# Patient Record
Sex: Male | Born: 1935 | Race: White | Hispanic: No | Marital: Married | Smoking: Former smoker
Health system: Southern US, Community
[De-identification: ages and names within clinical notes are randomized; demographics above are authoritative.]

## PROBLEM LIST (undated history)

## (undated) DIAGNOSIS — I10 Essential (primary) hypertension: Secondary | ICD-10-CM

## (undated) DIAGNOSIS — F200 Paranoid schizophrenia: Secondary | ICD-10-CM

## (undated) DIAGNOSIS — I5189 Other ill-defined heart diseases: Secondary | ICD-10-CM

## (undated) DIAGNOSIS — R1314 Dysphagia, pharyngoesophageal phase: Secondary | ICD-10-CM

## (undated) DIAGNOSIS — E669 Obesity, unspecified: Secondary | ICD-10-CM

## (undated) DIAGNOSIS — E785 Hyperlipidemia, unspecified: Secondary | ICD-10-CM

## (undated) DIAGNOSIS — R0600 Dyspnea, unspecified: Secondary | ICD-10-CM

## (undated) DIAGNOSIS — I2510 Atherosclerotic heart disease of native coronary artery without angina pectoris: Secondary | ICD-10-CM

## (undated) DIAGNOSIS — R269 Unspecified abnormalities of gait and mobility: Secondary | ICD-10-CM

## (undated) DIAGNOSIS — R251 Tremor, unspecified: Secondary | ICD-10-CM

## (undated) DIAGNOSIS — G2581 Restless legs syndrome: Secondary | ICD-10-CM

## (undated) DIAGNOSIS — G20 Parkinson's disease: Principal | ICD-10-CM

## (undated) DIAGNOSIS — J189 Pneumonia, unspecified organism: Secondary | ICD-10-CM

## (undated) DIAGNOSIS — S42309A Unspecified fracture of shaft of humerus, unspecified arm, initial encounter for closed fracture: Secondary | ICD-10-CM

## (undated) DIAGNOSIS — Z8679 Personal history of other diseases of the circulatory system: Secondary | ICD-10-CM

## (undated) DIAGNOSIS — I679 Cerebrovascular disease, unspecified: Secondary | ICD-10-CM

## (undated) HISTORY — DX: Personal history of other diseases of the circulatory system: Z86.79

## (undated) HISTORY — DX: Restless legs syndrome: G25.81

## (undated) HISTORY — PX: TONSILLECTOMY: SUR1361

## (undated) HISTORY — DX: Other ill-defined heart diseases: I51.89

## (undated) HISTORY — DX: Unspecified fracture of shaft of humerus, unspecified arm, initial encounter for closed fracture: S42.309A

## (undated) HISTORY — DX: Parkinson's disease: G20

## (undated) HISTORY — DX: Cerebrovascular disease, unspecified: I67.9

## (undated) HISTORY — DX: Paranoid schizophrenia: F20.0

## (undated) HISTORY — DX: Dyspnea, unspecified: R06.00

## (undated) HISTORY — PX: CHOLECYSTECTOMY: SHX55

## (undated) HISTORY — DX: Obesity, unspecified: E66.9

## (undated) HISTORY — DX: Unspecified abnormalities of gait and mobility: R26.9

## (undated) HISTORY — DX: Tremor, unspecified: R25.1

## (undated) HISTORY — DX: Hyperlipidemia, unspecified: E78.5

## (undated) HISTORY — DX: Dysphagia, pharyngoesophageal phase: R13.14

## (undated) HISTORY — DX: Atherosclerotic heart disease of native coronary artery without angina pectoris: I25.10

## (undated) HISTORY — DX: Pneumonia, unspecified organism: J18.9

## (undated) HISTORY — DX: Essential (primary) hypertension: I10

## (undated) HISTORY — PX: ASD REPAIR: SHX258

---

## 1998-08-14 ENCOUNTER — Ambulatory Visit (HOSPITAL_COMMUNITY): Admission: RE | Admit: 1998-08-14 | Discharge: 1998-08-14 | Payer: Self-pay | Admitting: Internal Medicine

## 1998-12-19 ENCOUNTER — Encounter: Payer: Self-pay | Admitting: Emergency Medicine

## 1998-12-19 ENCOUNTER — Emergency Department (HOSPITAL_COMMUNITY): Admission: EM | Admit: 1998-12-19 | Discharge: 1998-12-19 | Payer: Self-pay | Admitting: Emergency Medicine

## 2006-03-31 HISTORY — PX: CORONARY ARTERY BYPASS GRAFT: SHX141

## 2006-08-30 ENCOUNTER — Encounter: Payer: Self-pay | Admitting: Emergency Medicine

## 2006-08-30 ENCOUNTER — Inpatient Hospital Stay (HOSPITAL_COMMUNITY): Admission: AD | Admit: 2006-08-30 | Discharge: 2006-09-09 | Payer: Self-pay | Admitting: Cardiology

## 2006-08-31 ENCOUNTER — Encounter: Payer: Self-pay | Admitting: Cardiology

## 2006-08-31 ENCOUNTER — Encounter: Payer: Self-pay | Admitting: Surgery

## 2006-08-31 HISTORY — PX: CARDIAC CATHETERIZATION: SHX172

## 2006-09-01 ENCOUNTER — Ambulatory Visit: Payer: Self-pay | Admitting: Surgery

## 2006-09-01 ENCOUNTER — Encounter: Payer: Self-pay | Admitting: Surgery

## 2006-10-06 ENCOUNTER — Ambulatory Visit: Payer: Self-pay | Admitting: Surgery

## 2007-04-28 ENCOUNTER — Ambulatory Visit: Payer: Self-pay | Admitting: Vascular Surgery

## 2008-05-25 ENCOUNTER — Encounter: Admission: RE | Admit: 2008-05-25 | Discharge: 2008-05-25 | Payer: Self-pay | Admitting: Neurology

## 2009-03-27 ENCOUNTER — Inpatient Hospital Stay (HOSPITAL_COMMUNITY): Admission: EM | Admit: 2009-03-27 | Discharge: 2009-04-02 | Payer: Self-pay | Admitting: Emergency Medicine

## 2009-05-04 ENCOUNTER — Inpatient Hospital Stay (HOSPITAL_COMMUNITY): Admission: AD | Admit: 2009-05-04 | Discharge: 2009-05-09 | Payer: Self-pay | Admitting: Internal Medicine

## 2009-05-07 ENCOUNTER — Encounter (INDEPENDENT_AMBULATORY_CARE_PROVIDER_SITE_OTHER): Payer: Self-pay | Admitting: Internal Medicine

## 2009-08-06 ENCOUNTER — Emergency Department (HOSPITAL_COMMUNITY): Admission: EM | Admit: 2009-08-06 | Discharge: 2009-08-06 | Payer: Self-pay | Admitting: Emergency Medicine

## 2010-03-24 ENCOUNTER — Emergency Department (HOSPITAL_COMMUNITY)
Admission: EM | Admit: 2010-03-24 | Discharge: 2010-03-24 | Payer: Self-pay | Source: Home / Self Care | Admitting: Emergency Medicine

## 2010-03-28 ENCOUNTER — Ambulatory Visit: Payer: Self-pay | Admitting: Cardiology

## 2010-04-02 ENCOUNTER — Emergency Department (HOSPITAL_COMMUNITY)
Admission: EM | Admit: 2010-04-02 | Discharge: 2010-04-03 | Payer: Self-pay | Source: Home / Self Care | Admitting: Emergency Medicine

## 2010-04-03 LAB — POCT I-STAT, CHEM 8
BUN: 10 mg/dL (ref 6–23)
Calcium, Ion: 1.04 mmol/L — ABNORMAL LOW (ref 1.12–1.32)
Chloride: 94 mEq/L — ABNORMAL LOW (ref 96–112)
Creatinine, Ser: 0.7 mg/dL (ref 0.4–1.5)
Glucose, Bld: 206 mg/dL — ABNORMAL HIGH (ref 70–99)
HCT: 45 % (ref 39.0–52.0)
Hemoglobin: 15.3 g/dL (ref 13.0–17.0)
Potassium: 3.2 mEq/L — ABNORMAL LOW (ref 3.5–5.1)
Sodium: 131 mEq/L — ABNORMAL LOW (ref 135–145)
TCO2: 28 mmol/L (ref 0–100)

## 2010-04-03 LAB — POCT CARDIAC MARKERS
CKMB, poc: 1.1 ng/mL (ref 1.0–8.0)
Myoglobin, poc: 61.8 ng/mL (ref 12–200)
Troponin i, poc: <0.05 ng/mL (ref 0.00–0.09)

## 2010-05-10 ENCOUNTER — Encounter (INDEPENDENT_AMBULATORY_CARE_PROVIDER_SITE_OTHER): Payer: Medicare Other | Admitting: Cardiology

## 2010-05-10 DIAGNOSIS — I519 Heart disease, unspecified: Secondary | ICD-10-CM

## 2010-05-10 DIAGNOSIS — I10 Essential (primary) hypertension: Secondary | ICD-10-CM

## 2010-05-10 DIAGNOSIS — E119 Type 2 diabetes mellitus without complications: Secondary | ICD-10-CM

## 2010-05-10 DIAGNOSIS — R0602 Shortness of breath: Secondary | ICD-10-CM

## 2010-06-10 LAB — URINALYSIS, ROUTINE W REFLEX MICROSCOPIC
Bilirubin Urine: NEGATIVE
Glucose, UA: 500 mg/dL — AB
Hgb urine dipstick: NEGATIVE
Ketones, ur: NEGATIVE mg/dL
Nitrite: NEGATIVE
Protein, ur: NEGATIVE mg/dL
Specific Gravity, Urine: 1.011 (ref 1.005–1.030)
Urobilinogen, UA: 1 mg/dL (ref 0.0–1.0)
pH: 7 (ref 5.0–8.0)

## 2010-06-10 LAB — DIFFERENTIAL
Basophils Absolute: 0 10*3/uL (ref 0.0–0.1)
Basophils Absolute: 0 10*3/uL (ref 0.0–0.1)
Basophils Relative: 0 % (ref 0–1)
Eosinophils Absolute: 0.1 10*3/uL (ref 0.0–0.7)
Eosinophils Relative: 1 % (ref 0–5)
Lymphocytes Relative: 28 % (ref 12–46)
Lymphocytes Relative: 31 % (ref 12–46)
Lymphs Abs: 3.3 10*3/uL (ref 0.7–4.0)
Monocytes Absolute: 1 10*3/uL (ref 0.1–1.0)
Monocytes Absolute: 1.2 10*3/uL — ABNORMAL HIGH (ref 0.1–1.0)
Monocytes Relative: 10 % (ref 3–12)
Monocytes Relative: 11 % (ref 3–12)
Neutro Abs: 6.3 10*3/uL (ref 1.7–7.7)
Neutro Abs: 6.5 10*3/uL (ref 1.7–7.7)
Neutrophils Relative %: 58 % (ref 43–77)
Neutrophils Relative %: 59 % (ref 43–77)

## 2010-06-10 LAB — CBC
HCT: 38.5 % — ABNORMAL LOW (ref 39.0–52.0)
HCT: 40.8 % (ref 39.0–52.0)
Hemoglobin: 13.3 g/dL (ref 13.0–17.0)
Hemoglobin: 14.3 g/dL (ref 13.0–17.0)
MCH: 30.7 pg (ref 26.0–34.0)
MCH: 31.7 pg (ref 26.0–34.0)
MCHC: 34.5 g/dL (ref 30.0–36.0)
MCHC: 35 g/dL (ref 30.0–36.0)
MCV: 88.9 fL (ref 78.0–100.0)
MCV: 90.5 fL (ref 78.0–100.0)
Platelets: 315 10*3/uL (ref 150–400)
Platelets: 355 10*3/uL (ref 150–400)
RBC: 4.33 MIL/uL (ref 4.22–5.81)
RBC: 4.51 MIL/uL (ref 4.22–5.81)
RDW: 12.5 % (ref 11.5–15.5)
RDW: 12.7 % (ref 11.5–15.5)
WBC: 10.8 10*3/uL — ABNORMAL HIGH (ref 4.0–10.5)
WBC: 10.9 10*3/uL — ABNORMAL HIGH (ref 4.0–10.5)

## 2010-06-10 LAB — POCT CARDIAC MARKERS
CKMB, poc: <1 ng/mL — ABNORMAL LOW (ref 1.0–8.0)
Myoglobin, poc: 55 ng/mL (ref 12–200)
Troponin i, poc: <0.05 ng/mL (ref 0.00–0.09)

## 2010-06-10 LAB — COMPREHENSIVE METABOLIC PANEL
Albumin: 3.6 g/dL (ref 3.5–5.2)
BUN: 10 mg/dL (ref 6–23)
Creatinine, Ser: 0.84 mg/dL (ref 0.4–1.5)
Glucose, Bld: 199 mg/dL — ABNORMAL HIGH (ref 70–99)
Total Protein: 6.9 g/dL (ref 6.0–8.3)

## 2010-06-10 LAB — BRAIN NATRIURETIC PEPTIDE: Pro B Natriuretic peptide (BNP): 160 pg/mL — ABNORMAL HIGH (ref 0.0–100.0)

## 2010-06-16 LAB — COMPREHENSIVE METABOLIC PANEL
ALT: 35 U/L (ref 0–53)
AST: 31 U/L (ref 0–37)
Albumin: 2.7 g/dL — ABNORMAL LOW (ref 3.5–5.2)
Alkaline Phosphatase: 62 U/L (ref 39–117)
Calcium: 8.6 mg/dL (ref 8.4–10.5)
GFR calc Af Amer: >60 mL/min (ref >60)
Glucose, Bld: 126 mg/dL — ABNORMAL HIGH (ref 70–99)
Potassium: 3.6 mEq/L (ref 3.5–5.1)
Sodium: 137 mEq/L (ref 135–145)
Total Protein: 6.3 g/dL (ref 6.0–8.3)

## 2010-06-16 LAB — GLUCOSE, CAPILLARY
Glucose-Capillary: 155 mg/dL — ABNORMAL HIGH (ref 70–99)
Glucose-Capillary: 189 mg/dL — ABNORMAL HIGH (ref 70–99)
Glucose-Capillary: 197 mg/dL — ABNORMAL HIGH (ref 70–99)
Glucose-Capillary: 247 mg/dL — ABNORMAL HIGH (ref 70–99)
Glucose-Capillary: 95 mg/dL (ref 70–99)

## 2010-06-16 LAB — DIFFERENTIAL
Basophils Relative: 1 % (ref 0–1)
Eosinophils Absolute: 0.4 10*3/uL (ref 0.0–0.7)
Eosinophils Relative: 4 % (ref 0–5)
Lymphs Abs: 2.2 10*3/uL (ref 0.7–4.0)
Monocytes Absolute: 1.1 10*3/uL — ABNORMAL HIGH (ref 0.1–1.0)
Monocytes Relative: 11 % (ref 3–12)

## 2010-06-16 LAB — CBC
Hemoglobin: 12 g/dL — ABNORMAL LOW (ref 13.0–17.0)
MCHC: 33.6 g/dL (ref 30.0–36.0)
Platelets: 303 10*3/uL (ref 150–400)
RDW: 13.6 % (ref 11.5–15.5)

## 2010-06-18 LAB — COMPREHENSIVE METABOLIC PANEL
ALT: 21 U/L (ref 0–53)
AST: 24 U/L (ref 0–37)
Alkaline Phosphatase: 69 U/L (ref 39–117)
CO2: 25 mEq/L (ref 19–32)
Chloride: 97 mEq/L (ref 96–112)
GFR calc Af Amer: >60 mL/min (ref >60)
GFR calc non Af Amer: >60 mL/min (ref >60)
Glucose, Bld: 288 mg/dL — ABNORMAL HIGH (ref 70–99)
Sodium: 134 mEq/L — ABNORMAL LOW (ref 135–145)
Total Bilirubin: 0.6 mg/dL (ref 0.3–1.2)

## 2010-06-18 LAB — DIFFERENTIAL
Basophils Absolute: 0 10*3/uL (ref 0.0–0.1)
Basophils Relative: 0 % (ref 0–1)
Eosinophils Absolute: 0.2 10*3/uL (ref 0.0–0.7)
Eosinophils Relative: 2 % (ref 0–5)
Neutrophils Relative %: 65 % (ref 43–77)

## 2010-06-18 LAB — URINALYSIS, ROUTINE W REFLEX MICROSCOPIC
Bilirubin Urine: NEGATIVE
Glucose, UA: 500 mg/dL — AB
Hgb urine dipstick: NEGATIVE
Protein, ur: NEGATIVE mg/dL
Urobilinogen, UA: 0.2 mg/dL (ref 0.0–1.0)

## 2010-06-18 LAB — CBC
HCT: 38.5 % — ABNORMAL LOW (ref 39.0–52.0)
MCHC: 33.5 g/dL (ref 30.0–36.0)
MCV: 93.9 fL (ref 78.0–100.0)
Platelets: 308 10*3/uL (ref 150–400)
RBC: 4.1 MIL/uL — ABNORMAL LOW (ref 4.22–5.81)
WBC: 10.3 10*3/uL (ref 4.0–10.5)

## 2010-06-18 LAB — POCT CARDIAC MARKERS
CKMB, poc: <1 ng/mL — ABNORMAL LOW (ref 1.0–8.0)
Troponin i, poc: <0.05 ng/mL (ref 0.00–0.09)
Troponin i, poc: <0.05 ng/mL (ref 0.00–0.09)

## 2010-06-18 LAB — TROPONIN I: Troponin I: 0.01 ng/mL (ref 0.00–0.06)

## 2010-06-19 LAB — BASIC METABOLIC PANEL
CO2: 27 mEq/L (ref 19–32)
Calcium: 7.6 mg/dL — ABNORMAL LOW (ref 8.4–10.5)
Calcium: 8 mg/dL — ABNORMAL LOW (ref 8.4–10.5)
Chloride: 98 mEq/L (ref 96–112)
GFR calc Af Amer: >60 mL/min (ref >60)
GFR calc non Af Amer: >60 mL/min (ref >60)
Potassium: 3.3 mEq/L — ABNORMAL LOW (ref 3.5–5.1)
Potassium: 3.5 mEq/L (ref 3.5–5.1)
Sodium: 124 mEq/L — ABNORMAL LOW (ref 135–145)

## 2010-06-19 LAB — COMPREHENSIVE METABOLIC PANEL
Albumin: 3.3 g/dL — ABNORMAL LOW (ref 3.5–5.2)
Alkaline Phosphatase: 71 U/L (ref 39–117)
BUN: 14 mg/dL (ref 6–23)
Calcium: 8.2 mg/dL — ABNORMAL LOW (ref 8.4–10.5)
Potassium: 3.8 mEq/L (ref 3.5–5.1)
Total Protein: 7 g/dL (ref 6.0–8.3)

## 2010-06-19 LAB — GLUCOSE, CAPILLARY
Glucose-Capillary: 114 mg/dL — ABNORMAL HIGH (ref 70–99)
Glucose-Capillary: 123 mg/dL — ABNORMAL HIGH (ref 70–99)
Glucose-Capillary: 127 mg/dL — ABNORMAL HIGH (ref 70–99)
Glucose-Capillary: 130 mg/dL — ABNORMAL HIGH (ref 70–99)
Glucose-Capillary: 138 mg/dL — ABNORMAL HIGH (ref 70–99)
Glucose-Capillary: 141 mg/dL — ABNORMAL HIGH (ref 70–99)
Glucose-Capillary: 158 mg/dL — ABNORMAL HIGH (ref 70–99)
Glucose-Capillary: 193 mg/dL — ABNORMAL HIGH (ref 70–99)
Glucose-Capillary: 200 mg/dL — ABNORMAL HIGH (ref 70–99)
Glucose-Capillary: 203 mg/dL — ABNORMAL HIGH (ref 70–99)
Glucose-Capillary: 71 mg/dL (ref 70–99)
Glucose-Capillary: 79 mg/dL (ref 70–99)

## 2010-06-19 LAB — CBC
HCT: 30.5 % — ABNORMAL LOW (ref 39.0–52.0)
HCT: 34.3 % — ABNORMAL LOW (ref 39.0–52.0)
Hemoglobin: 10.6 g/dL — ABNORMAL LOW (ref 13.0–17.0)
MCHC: 34.7 g/dL (ref 30.0–36.0)
MCHC: 35 g/dL (ref 30.0–36.0)
MCV: 93.1 fL (ref 78.0–100.0)
Platelets: 190 10*3/uL (ref 150–400)
RBC: 3.27 MIL/uL — ABNORMAL LOW (ref 4.22–5.81)
RDW: 13.6 % (ref 11.5–15.5)

## 2010-06-19 LAB — CULTURE, BLOOD (ROUTINE X 2): Culture: NO GROWTH

## 2010-06-19 LAB — BRAIN NATRIURETIC PEPTIDE: Pro B Natriuretic peptide (BNP): 53 pg/mL (ref 0.0–100.0)

## 2010-06-19 NOTE — Progress Notes (Signed)
This encounter was created in error - please disregard.

## 2010-07-01 LAB — COMPREHENSIVE METABOLIC PANEL
ALT: 23 U/L (ref 0–53)
ALT: 25 U/L (ref 0–53)
ALT: 26 U/L (ref 0–53)
ALT: 28 U/L (ref 0–53)
AST: 19 U/L (ref 0–37)
AST: 21 U/L (ref 0–37)
AST: 31 U/L (ref 0–37)
Albumin: 2.7 g/dL — ABNORMAL LOW (ref 3.5–5.2)
Albumin: 2.8 g/dL — ABNORMAL LOW (ref 3.5–5.2)
Albumin: 3.2 g/dL — ABNORMAL LOW (ref 3.5–5.2)
Alkaline Phosphatase: 65 U/L (ref 39–117)
Alkaline Phosphatase: 67 U/L (ref 39–117)
Alkaline Phosphatase: 70 U/L (ref 39–117)
Alkaline Phosphatase: 81 U/L (ref 39–117)
Alkaline Phosphatase: 99 U/L (ref 39–117)
BUN: 15 mg/dL (ref 6–23)
BUN: 15 mg/dL (ref 6–23)
CO2: 27 mEq/L (ref 19–32)
CO2: 29 mEq/L (ref 19–32)
Calcium: 8.2 mg/dL — ABNORMAL LOW (ref 8.4–10.5)
Calcium: 8.3 mg/dL — ABNORMAL LOW (ref 8.4–10.5)
Chloride: 101 mEq/L (ref 96–112)
Creatinine, Ser: 0.91 mg/dL (ref 0.4–1.5)
GFR calc Af Amer: >60 mL/min (ref >60)
GFR calc Af Amer: >60 mL/min (ref >60)
GFR calc Af Amer: >60 mL/min (ref >60)
GFR calc non Af Amer: 56 mL/min — ABNORMAL LOW (ref >60)
GFR calc non Af Amer: >60 mL/min (ref >60)
Glucose, Bld: 180 mg/dL — ABNORMAL HIGH (ref 70–99)
Glucose, Bld: 193 mg/dL — ABNORMAL HIGH (ref 70–99)
Glucose, Bld: 217 mg/dL — ABNORMAL HIGH (ref 70–99)
Potassium: 2.7 mEq/L — CL (ref 3.5–5.1)
Potassium: 3.2 mEq/L — ABNORMAL LOW (ref 3.5–5.1)
Potassium: 3.8 mEq/L (ref 3.5–5.1)
Potassium: 3.9 mEq/L (ref 3.5–5.1)
Potassium: 4.1 mEq/L (ref 3.5–5.1)
Sodium: 129 mEq/L — ABNORMAL LOW (ref 135–145)
Sodium: 134 mEq/L — ABNORMAL LOW (ref 135–145)
Sodium: 135 mEq/L (ref 135–145)
Sodium: 135 mEq/L (ref 135–145)
Total Protein: 6.2 g/dL (ref 6.0–8.3)
Total Protein: 6.3 g/dL (ref 6.0–8.3)
Total Protein: 6.3 g/dL (ref 6.0–8.3)
Total Protein: 6.5 g/dL (ref 6.0–8.3)
Total Protein: 7.7 g/dL (ref 6.0–8.3)

## 2010-07-01 LAB — GLUCOSE, CAPILLARY
Glucose-Capillary: 200 mg/dL — ABNORMAL HIGH (ref 70–99)
Glucose-Capillary: 200 mg/dL — ABNORMAL HIGH (ref 70–99)
Glucose-Capillary: 217 mg/dL — ABNORMAL HIGH (ref 70–99)
Glucose-Capillary: 221 mg/dL — ABNORMAL HIGH (ref 70–99)
Glucose-Capillary: 233 mg/dL — ABNORMAL HIGH (ref 70–99)
Glucose-Capillary: 236 mg/dL — ABNORMAL HIGH (ref 70–99)
Glucose-Capillary: 237 mg/dL — ABNORMAL HIGH (ref 70–99)
Glucose-Capillary: 242 mg/dL — ABNORMAL HIGH (ref 70–99)
Glucose-Capillary: 244 mg/dL — ABNORMAL HIGH (ref 70–99)
Glucose-Capillary: 258 mg/dL — ABNORMAL HIGH (ref 70–99)
Glucose-Capillary: 279 mg/dL — ABNORMAL HIGH (ref 70–99)

## 2010-07-01 LAB — CBC
HCT: 32.9 % — ABNORMAL LOW (ref 39.0–52.0)
HCT: 33.9 % — ABNORMAL LOW (ref 39.0–52.0)
Hemoglobin: 11.2 g/dL — ABNORMAL LOW (ref 13.0–17.0)
Hemoglobin: 11.3 g/dL — ABNORMAL LOW (ref 13.0–17.0)
Hemoglobin: 12.8 g/dL — ABNORMAL LOW (ref 13.0–17.0)
Hemoglobin: 14 g/dL (ref 13.0–17.0)
MCHC: 33.7 g/dL (ref 30.0–36.0)
MCHC: 34.1 g/dL (ref 30.0–36.0)
Platelets: 215 10*3/uL (ref 150–400)
Platelets: 242 10*3/uL (ref 150–400)
Platelets: 259 10*3/uL (ref 150–400)
RBC: 3.48 MIL/uL — ABNORMAL LOW (ref 4.22–5.81)
RBC: 4.47 MIL/uL (ref 4.22–5.81)
RDW: 13 % (ref 11.5–15.5)
RDW: 13.6 % (ref 11.5–15.5)
RDW: 13.7 % (ref 11.5–15.5)
RDW: 13.8 % (ref 11.5–15.5)
WBC: 10.6 10*3/uL — ABNORMAL HIGH (ref 4.0–10.5)
WBC: 18.1 10*3/uL — ABNORMAL HIGH (ref 4.0–10.5)

## 2010-07-01 LAB — BASIC METABOLIC PANEL
BUN: 15 mg/dL (ref 6–23)
CO2: 25 mEq/L (ref 19–32)
Glucose, Bld: 196 mg/dL — ABNORMAL HIGH (ref 70–99)
Potassium: 3.8 mEq/L (ref 3.5–5.1)
Sodium: 132 mEq/L — ABNORMAL LOW (ref 135–145)

## 2010-07-01 LAB — CARDIAC PANEL(CRET KIN+CKTOT+MB+TROPI)
CK, MB: 2.2 ng/mL (ref 0.3–4.0)
Relative Index: 0.6 (ref 0.0–2.5)
Total CK: 382 U/L — ABNORMAL HIGH (ref 7–232)
Troponin I: 0.02 ng/mL (ref 0.00–0.06)

## 2010-07-01 LAB — URINE CULTURE

## 2010-07-01 LAB — BLOOD GAS, ARTERIAL
Acid-Base Excess: 0 mmol/L (ref 0.0–2.0)
Drawn by: 145321
O2 Content: 3 L/min
O2 Saturation: 96.1 %
TCO2: 21.3 mmol/L (ref 0–100)

## 2010-07-01 LAB — CULTURE, BLOOD (ROUTINE X 2): Culture: NO GROWTH

## 2010-07-01 LAB — URINALYSIS, ROUTINE W REFLEX MICROSCOPIC
Bilirubin Urine: NEGATIVE
Ketones, ur: NEGATIVE mg/dL
Nitrite: NEGATIVE
Specific Gravity, Urine: 1.012 (ref 1.005–1.030)
Urobilinogen, UA: 1 mg/dL (ref 0.0–1.0)

## 2010-07-01 LAB — DIFFERENTIAL
Basophils Relative: 1 % (ref 0–1)
Eosinophils Absolute: 0 10*3/uL (ref 0.0–0.7)
Lymphs Abs: 1.4 10*3/uL (ref 0.7–4.0)
Monocytes Absolute: 0.7 10*3/uL (ref 0.1–1.0)
Neutrophils Relative %: 87 % — ABNORMAL HIGH (ref 43–77)

## 2010-07-01 LAB — URIC ACID, RANDOM URINE: Uric Acid, Urine: 16.4 mg/dL

## 2010-07-08 ENCOUNTER — Other Ambulatory Visit: Payer: Self-pay | Admitting: *Deleted

## 2010-07-08 DIAGNOSIS — I10 Essential (primary) hypertension: Secondary | ICD-10-CM

## 2010-07-08 MED ORDER — METOPROLOL TARTRATE 50 MG PO TABS: 50.0000 mg | ORAL_TABLET | Freq: Two times a day (BID) | ORAL | Status: DC

## 2010-07-08 NOTE — Telephone Encounter (Signed)
escribe medication per fax request  

## 2010-07-18 ENCOUNTER — Other Ambulatory Visit: Payer: Self-pay | Admitting: *Deleted

## 2010-07-18 DIAGNOSIS — I2510 Atherosclerotic heart disease of native coronary artery without angina pectoris: Secondary | ICD-10-CM

## 2010-07-18 MED ORDER — POTASSIUM CHLORIDE 20 MEQ/15ML (10%) PO LIQD: ORAL | Status: DC

## 2010-07-18 NOTE — Telephone Encounter (Signed)
escribe medication per fax request  

## 2010-07-22 ENCOUNTER — Telehealth: Payer: Self-pay | Admitting: Cardiology

## 2010-07-22 NOTE — Telephone Encounter (Signed)
Wife called stating he has been having weakness in legs and muscles. Wants to know what to do. Advised to stop Simvastatin for 1 wk and to call if not any better.

## 2010-07-22 NOTE — Telephone Encounter (Signed)
Has some questions regarding her husband's medications

## 2010-08-07 ENCOUNTER — Other Ambulatory Visit: Payer: Self-pay | Admitting: Cardiology

## 2010-08-08 ENCOUNTER — Other Ambulatory Visit: Payer: Self-pay | Admitting: *Deleted

## 2010-08-08 NOTE — Telephone Encounter (Signed)
escribe medication per fax request  

## 2010-08-13 NOTE — Consult Note (Signed)
NAMEMarland Perez  COLLIER, BOHNET NO.:  [id number]   MEDICAL RECORD NO.:  [id number]          PATIENT TYPE:  INP   LOCATION:  3305                         FACILITY:  [facility]   PHYSICIAN:  Evelene Croon, M.D.     DATE OF BIRTH:  11/04/1933   DATE OF CONSULTATION:  09/01/2006  DATE OF DISCHARGE:                                 CONSULTATION   CARDIOVASCULAR SURGERY CONSULTATION:   REFERRING PHYSICIAN:  Peter M. [place]il[place], M.D.   REASON FOR CONSULTATION:  Severe 3-vessel coronary disease status post  acute non-ST segment elevation MI.   CLINICAL HISTORY:  I was asked to evaluate this gentleman for  consideration of coronary artery bypass graft surgery by Dr. Peter  [place].  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  This started about 10 p.m. and he took some gas-x with some easing of  his symptoms.  About one hour later, his pain got worse and he took his  amitriptyline and tried to go to sleep.  He did not sleep well at night  and continued to have chest pain in the morning and presented to the  emergency room where his pain was relieved about 5 a.m.  On  presentation, he was found to have troponin of 0.69 with initial CPK/MB  of 22.4.  His repeat troponin was 1.36.  Electrocardiogram showed  significant anterolateral ST segment depression.  These findings were  resolved on repeat EKG.  He had mild dull chest pain yesterday morning  and underwent cardiac catheterization, which showed severe 3 vessel  disease.  His LAD was diffusely diseased with 70% to 80% osteal, 90%  mid, and 90% distal stenosis at the apex.  There was a moderate sized  first diagonal that had an 80% stenosis.  The left circumflex had 40% to  50% proximal stenosis and 90% first  marginal stenosis.  There was 70%  distal left circumflex stenosis before a small distal branch.  The right  coronary artery had 40% to 50% mid vessel stenosis.  There was a mobile  filling defect distally consistent with thrombus.  There was 90%  posterior descending and posterolateral stenosis.  Left ventricular  ejection fraction was about 45% with moderate inferior hypokinesis.   PAST MEDICAL HISTORY:  Significant for:  1. Type 2 diabetes followed by Dr. Jacky Kindle.  2. History of hypertension.  3. History of glaucoma.  4. History of paranoid schizophrenia for many years treated with      medication and well-controlled.  5. History of gout.   PAST SURGICAL HISTORY:  1. He is status post cholecystectomy.  2. Status post tonsillectomy.  3. Status post repair of deviated nasal septum after breaking his      nose.   ALLERGIES:  NONE.   REVIEW OF SYSTEMS:  Are as follows:  GENERAL:  He denies any fever or  chills.  He has  had no recent weight changes.  He denies fatigue.  EYES:  Negative.  ENT:  Negative.  ENDOCRINE:  He denies hypothyroidism.  He  has a history of diabetes as mentioned above.  CARDIOVASCULAR:  As  above.  He denies PND and orthopnea.  Denies peripheral edema.  He has  had no palpitations.  He denies any previous cardiac symptoms before his  presentation.  RESPIRATORY:  He denies cough and sputum production.  GI:  He denies nausea and vomiting.  Denies melena and bright red blood per  rectum.  GU:  Denies dysuria and hematuria.  NEUROLOGICALLY:  Denies any  focal weakness or numbness.  Denies dizziness and syncope.  He has never  had TIA or a stroke.  MUSCULOSKELETAL:  He denies arthralgias and  myalgias.  PSYCHIATRIC:  He has a long history of paranoid schizophrenia  as mentioned above.  He does have a tremor related to trifluoperazine.  HEMATOLOGICAL:  He denies any history of bleeding disorders or easy  bleeding.   MEDICATIONS:  At time of admission were:  1.  Amitriptyline 75 mg daily.  2. Glyburide/metformin 5/500 daily.  3. Felodipine ER 5 mg b.i.d.  4. Allopurinol 300 mg daily.  5. Benazepril 10 mg b.i.d.  6. Trifluoperazine 2 mg in the morning and 1 mg in the evening.  7. Cosopt eye drops b.i.d.  8. Alphagan 0.1% eye drops b.i.d.  9. Travatan eye drops 0.004% nightly.   SOCIAL HISTORY:  He is married and retired on disability since 1970s due  to his schizophrenia.  He smoked in the early 60s, but none since.  He  has 2 adult children.   FAMILY HISTORY:  Negative for cardiac disease.   PHYSICAL EXAMINATION:  VITAL SIGNS:  His blood pressure is 130/75, and  his pulse is 80 and regular.  Respiratory rate is 16 non-labored.  GENERAL:  He is a well-developed, obese, white male in no distress.  HEENT:  Shows to be normocephalic, atraumatic.  Pupils equal and  reactive to light and accommodation.  Extraocular muscles are intact.  Throat is clear.  NECK:  Shows normal carotid pulses bilaterally.  There are no bruits.  There is no adenopathy or thyromegaly.  CARDIAC:  Shows regular rate and rhythm with normal S1, S2.  There is no  murmur, rub, or gallop.  LUNGS:  Clear.  ABDOMINAL EXAM:  Shows active bowel wounds.  His abdomen is soft, obese,  and non-tender.  There are no palpable masses or organomegaly.  EXTREMITY EXAM:  Shows no peripheral edema.  Pedal pulses are palpable  bilaterally.  SKIN:  Warm and dry.  NEUROLOGIC EXAM:  Shows him to be alert and oriented x3.  He has a  slightly depressed affect.  His motor and sensory exam is grossly  normal.  He does have a tremor particularly involving his upper  extremities and his face.  This is chronic according to the patient.   LABORATORY DATA:  Shows a peak CPK of 750 and an MB of 80.5 and a  troponin of 18.51.  His electrolytes were normal with a BUN of 12 and a  creatinine of 1.1.  Hemoglobin is 14.2, white blood cell count 12.6.  Liver function profile is within normal  limits.  Electrocardiogram shows normal sinus rhythm with left ventricular  hypertrophy and repolarization abnormality.   IMPRESSION:  [name] has a severe 3-vessel coronary artery disease  presenting with non-ST segment elevation myocardial infarction.  I agree  that  coronary artery bypass graft surgery is the best treatment to  prevent further ischemia and infarction.  I discussed the operative  procedure with him including alternatives, benefits, and risks including  but not limited to bleeding, blood transfusion, infection, stroke,  myocardial infarction, graft failure,  and death.  He understands and would like to proceed with surgery.  We  will tentatively plan to proceed with surgery on Thursday, September 03, 2006.  He does have some evidence of thrombus in his distal right coronary  artery and I would recommend continuing him on heparin and integralin  until the time of surgery.      Evelene Croon, M.D.  Electronically Signed     BB/MEDQ  D:  09/01/2006  T:  09/01/2006  Job:  [id number]   cc:   Peter M. [place], M.D.  Geoffry Paradise, M.D.

## 2010-08-13 NOTE — Op Note (Signed)
NAMEMarland Kitchen  Terrance, Perez             ACCOUNT NO.:  [id number]   MEDICAL RECORD NO.:  [id number]          PATIENT TYPE:  INP   LOCATION:  2316                         FACILITY:  [facility]   PHYSICIAN:  Evelene Croon, M.D.     DATE OF BIRTH:  03/16/1934   DATE OF PROCEDURE:  09/03/2006  DATE OF DISCHARGE:                               OPERATIVE REPORT   PREOPERATIVE DIAGNOSES:  Severe 3-vessel coronary artery disease with  unstable angina and non-ST segment elevation myocardial infarction.   POSTOPERATIVE DIAGNOSIS:  Severe 3-vessel coronary artery disease with  unstable angina and non-ST segment elevation myocardial infarction.   OPERATIVE PROCEDURE:  Median sternotomy, extracorporeal circulation,  coronary artery bypass graft surgery x5 using a left internal mammary  artery graft to the left anterior descending coronary artery, a  saphenous vein graft to the diagonal branch of the LAD, and a sequential  saphenous vein graft to the posterior descending, posterolateral, and  obtuse marginal coronary arteries.  Endoscopic vein harvesting from the  right leg.   ATTENDING SURGEON:  Evelene Croon, M.D.   ASSISTANT:  Gershon Crane, PAC.   ANESTHESIA:  General endotracheal.   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  This started about 10:00 p.m. and his symptoms improved with an antacid.  About 1 hour later his pain got worse and he had difficulty sleeping all  night.  Early in the morning he continued to have chest pain and  presented to the emergency room where he was found to have a troponin of  0.69 with initial CKMB of 22.4.  The repeat troponin was 1.36.  Electrocardiogram showed significant anterolateral ST-segment  depression.  His findings on  EKG eventually resolved.  He underwent  cardiac catheterization which showed severe 3-vessel disease.  The LAD  was diffusely diseased with 70%-80% ostial stenosis.  There was 90% mid  stenosis and a 90% distal stenosis at the apex.  There was a moderate-  sized first diagonal that had an 80% stenosis.  The left circumflex had  40%-50% proximal stenosis.  There was 90% first marginal stenosis.  There was 70% distal left circumflex stenosis before a small distal  branch.  The right coronary artery had 40%-50% midvessel stenosis.  There was a notable filling defect distally consistent with thrombus.  There was 90% posterior descending and posterolateral stenosis.  Left  ventricular ejection fraction was about 45% with moderate inferior  hypokinesis.   After review of the angiogram and examination of the patient it was felt  that coronary artery bypass graft surgery was the best treatment to  prevent further ischemia and infarction.  I discussed the operative  procedure with the patient and his wife.  We discussed the alternatives,  benefits, and risks including but not limited to bleeding, blood  transfusion, infection, stroke, myocardial infarction, graft failure,  and death.  He understood and agreed to  proceed.   OPERATIVE PROCEDURE:  The patient was taken to the operating room and  placed on the table in supine position.  After induction of general  endotracheal anesthesia, a Foley catheter was placed in the bladder  using sterile technique.  Then the chest, abdomen and both lower  extremities were prepped and draped in the usual sterile manner.  The  chest was entered through a median sternotomy incision.  The pericardium  opened midline.  Examination of the heart showed good ventricular  contractility.  The ascending aorta had no palpable plaques in it.  The  ascending aorta was relatively short.  Then the left internal mammary  artery was harvested from the chest wall as a pedicle  graft.  This was a  medium to large caliber vessel with excellent blood flow through it.  At  the same time, a segment of greater saphenous vein was harvested from  the right leg using endoscopic vein harvest technique.  The lower  portion of this vein below the knee had some evidence of old phlebitis  with some organized thrombus and scar inside the vein.  From about the  knee level up the vein was of medium caliber and good quality.  We then  examined the vein adjacent to the left knee.  We were not able to find a  suitable vein here and therefore I decided only to use the vein that we  had available from the right leg.  Again, no vein was harvested from the  left leg.   Then the patient was heparinized and when an adequate activated clotting  time was achieved, the distal ascending aorta was cannulated using a 20-  [place] aortic cannula for arterial inflow.  Venous outflow was achieved  using a two-stage venous cannula for the right atrial appendage.  An  antegrade cardioplegia and vent cannula was inserted in the aortic root.   The patient was placed on cardiopulmonary bypass and distal coronaries  identified.  The LAD was diffusely diseased.  There was an area in the  mid to distal portion that was soft enough to graft.  This was the only  area that was soft enough to graft in the entire LAD.  The diagonal  branch was also diffusely diseased but one of the sub-branches was  graftable distally.  The obtuse marginal was diffusely diseased  throughout its proximal and midportions.  It was located just as it  became intramyocardial in its distal portion and it was soft enough to  graft here just before the bifurcation.  The right coronary artery gave  off small posterior descending and posterolateral branches which were  both diffusely diseased with plaque, but felt to be graftable.  I did  not feel his vessels would be suitable for repeat bypass grafting in the future.   Then the  aorta was crossclamped and 600 cc of cold blood antegrade  cardioplegia was administered in the aortic root with quick arrest of  the heart.  Systemic hypothermia to 20 degrees centigrade and topical  hypothermic iced saline was used.  A temperature probe was placed in the  septum and an insulating pad in the pericardium.   The first distal anastomosis was performed in the posterior descending  coronary artery.  The internal diameter was about 1.5 mm.  The conduit  used was a segment of the greater saphenous vein and the anastomosis  performed in a sequential side-to-side manner using continuous 7-0  Prolene suture.  Flow  was noted through the graft and was good.   Second distal anastomosis was performed to the posterolateral branch.  The internal diameter was also about 1.5 mm.  Conduit used was the same  segment of greater saphenous vein and the anastomosis performed in a  sequential side-to-side manner using continuous 7-0 Prolene suture.  Flow was noted through the graft and was excellent.   Then the third distal anastomosis was performed to the obtuse marginal  branch.  The internal diameter of this vessel was about 1.75 mm - 2 mm.  Conduit used was the same segment of greater saphenous vein and the  anastomosis performed in a sequential end-to-side manner using  continuous 7-0 Prolene suture.  Flow was noted through the graft and was  excellent.  Then another dose of cardioplegia was given down the vein  graft and aortic root.   The fourth distal anastomosis was performed to the diagonal branch.  The  internal diameter was 1.5 mm.  The conduit used was the second segment  of greater saphenous vein and anastomosis was performed in an end-to-  side manner using continuous 7-0 Prolene suture.  Flow was noted through  the graft and was excellent.   The fifth distal anastomosis was performed to the mid LAD.  The internal  diameter of this vessel was about 1.6 mm - 1.75 mm.  The  conduit used  was the left internal mammary graft and this was brought through an  opening in the left pericardium anterior to the phrenic nerve.  It was  anastomosed to the LAD in an end-to-side manner using continuous 8-0  Prolene suture.  The pedicle was sutured to the epicardium using 6-0  Prolene sutures.  The patient was then rewarmed to 37 degrees  centigrade.  With the crossclamp in place, the two proximal vein graft  anastomoses were performed to the aortic root in an end-to-side manner  using continuous 6-0 Prolene suture.  Then the clamp was removed from  the mammary pedicle.  There was rapid warming of the ventricular septum  and return of spontaneous ventricular fibrillation.  The crossclamp was  removed with a time of 95 minutes and the patient spontaneously returned  to sinus rhythm.  The proximal and distal anastomoses appeared hemostatic and aligned the grafts satisfactory.  Graft markers were  placed around the proximal anastomoses.  Two temporary right ventricular  and right atrial pacing wires were placed and brought out through the  skin.   When the patient was rewarmed to 37 degrees centigrade he was weaned  from cardiopulmonary bypass on no inotropic agents.  Total bypass time  was 115 minutes.  Cardiac function appeared excellent with a cardiac  output of 5 liters per minute.  Protamine was given and the venous and  aortic cannulas were removed without difficulty.  Hemostasis was  achieved.  Three chest tubes were placed, in the posterior pericardium  and one in the left pleural space and one in the anterior mediastinum.  The sternum was then closed with double #6 stainless steel wires.  The  fascia was closed with continuous #1 Vicryl suture.  Subcutaneous tissue  was closed with continuous 2-0 Vicryl and skin with 3-0 Vicryl  subcuticular closure.  The lower extremity vein harvest sites were  closed in layers in a similar manner.   The sponge, needles and  instrument counts were correct according to the  scrub nurse.  Dry sterile dressings were applied over the incisions  around the  chest tubes which were hooked to Pleur-Evac suction.  The  patient remained hemodynamically stable and was transferred to the SICU  in guarded but stable condition.      Evelene Croon, M.D.  Electronically Signed     BB/MEDQ  D:  09/03/2006  T:  09/03/2006  Job:  [id number]   cc:   Peter M. [place], M.D.

## 2010-08-13 NOTE — Cardiovascular Report (Signed)
NAME:  Terrance Perez, Terrance Perez NO.:  [id number]   MEDICAL RECORD NO.:  [id number]          PATIENT TYPE:  INP   LOCATION:  3305                         FACILITY:  [facility]   PHYSICIAN:  Peter M. [place]iland, M.D.  DATE OF BIRTH:  Aug 14, 1933   DATE OF PROCEDURE:  08/31/2006  DATE OF DISCHARGE:                            CARDIAC CATHETERIZATION   INDICATIONS FOR PROCEDURE:  The patient is a 77 year old white male with  history of diabetes, hypertension, hypercholesterolemia who presents  with a non-Q-wave myocardial infarction.  Access is via the right  femoral artery using standard Seldinger technique.   PROCEDURES:  Left heart catheterization, coronary and left ventricular  angiography.  Equipment, 6-French 4 cm right and left Judkins catheter,  6-French pigtail catheter, 6-French arterial sheath.   MEDICATIONS:  1. Local anesthesia with 1% Xylocaine.   TOTAL CONTRAST:  Omnipaque 115 mL.   HEMODYNAMIC DATA:  Aortic pressure is 101/56 with a mean of 73 mmHg,  left ventricle pressure was 106 with EDP of 24 mmHg.   ANGIOGRAPHIC DATA:  The left coronary artery arises and distributes  normally.  The left main coronary appears normal.   The left anterior descending artery is moderately calcified, has a 70-  80% ostial stenosis.  There is then a segmental 90% stenosis in the mid-  LAD.  There is 90% stenosis in the distal LAD at its terminal  bifurcation.  The first diagonal branch has 70% stenosis proximally.  The second diagonal branch is without significant disease.   The left circumflex coronary has 40-50% diffuse disease proximally.  There is a 90% stenosis in the first obtuse marginal vessel.  There is  then a 70% stenosis in the distal circumflex.   The right coronary is a large dominant vessel.  It has diffuse 40-50%  disease in the mid vessel.  There is a mobile filling defect in the  distal vessel consistent with thrombus.  There is an abrupt step-down in  vessel  diameter at the bifurcation of the terminal right coronary  artery.  The PDA has a 90% stenosis.  The posterolateral branch also has  a 90% stenosis.   The left ventricular angiography performed in RAO view demonstrates  normal left ventricular size.  There is moderate inferior wall  hypokinesia with overall ejection fraction of 45%.  There is no mitral  regurgitation or prolapse.   FINAL INTERPRETATION:  1. Severe three-vessel obstructive atherosclerotic coronary disease.  2. Mild left ventricular dysfunction.   PLAN:  Would recommend coronary artery bypass surgery.           ______________________________  Peter M. [place], M.D.     PMJ/MEDQ  D:  08/31/2006  T:  08/31/2006  Job:  [id number]   cc:   Geoffry Paradise, M.D.  [facility]

## 2010-08-13 NOTE — H&P (Signed)
NAME:  Terrance Perez, Terrance Perez NO.:  [id number]   MEDICAL RECORD NO.:  [id number]          PATIENT TYPE:  EMS   LOCATION:  ED                           FACILITY:  [facility]   PHYSICIAN:  Peter M. Swaziland, M.D.  DATE OF BIRTH:  09-Oct-1933   DATE OF ADMISSION:  08/30/2006  DATE OF DISCHARGE:                              HISTORY & PHYSICAL   The patient initially seen also on emergency room, but will be admitted  to [facility].   HISTORY OF PRESENT ILLNESS:  Terrance Perez is a 77 year old white male  with known history of diabetes and hypertension.  He states last night  he was putting together an entertainment center.  After bending over, he  felt a strong pain across his anterior chest and thought he had bad  indigestion.  This occurred approximately 10:00 p.m.  He took Gas-X and  then it seemed to ease for a period of time, but approximately an hour  later his pain got worse again.  He took his amitriptyline and tried to  rest but had a very fitful night.  He continued to have chest pain this  morning and presented to the emergency room, where his pain was relieved  at approximately 5:00 a.m.   He states he had some brief shortness of breath and sensation of  palpitations, but that it did not last very long.  He also had a light  sensation in his left arm.  The patient reports prior history of angina  in the 1960s, which was apparently related to medication.  He reports he  had an echocardiogram and nuclear stress test approximately 1 year ago,  as well as carotid Doppler studies that were unremarkable.  He has no  history of myocardial infarction.   PAST MEDICAL HISTORY:  Significant for:  1. Diabetes mellitus type 2.  2. Hypertension.  3. Glaucoma.  4. Paranoid schizophrenia.  5. Gout.   PAST SURGICAL HISTORY:  Prior surgeries include:  1. Cholecystectomy.  2. Tonsillectomy.  3. Repair of a deviated nasal septum.   ALLERGIES:  He has no known  allergies.   CURRENT MEDICATIONS:  His current medications include:  1. Amitriptyline 75 mg per day.  2. Glyburide/metformin 5/500 mg daily.  3. Felodipine ER 5 mg b.i.d.  4. Allopurinol 300 mg per day.  5. Benazepril 10 mg b.i.d.  6. Trifluoperazine 1 mg, 2 tablets in the morning, 1 in the evening.  7. Cosopt eye drops b.i.d.  8. Alphagan 0.1% eye drops b.i.d.  9. Travatan eye drops 0.004% nightly.   SOCIAL HISTORY:  The patient is married.  He is retired.  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  He smoked in the early 60s, but none since.  He has 2  children, 1of whom has hypertension.   FAMILY HISTORY:  Otherwise noncontributory.   REVIEW OF SYSTEMS:  He does report prior history of panic attacks, but  has not had any in a number of years.  His wife reports that he is  fairly sedentary.  He reports that he has not been eating appropriately  lately.  He was told in the past that his cholesterol was borderline.   REVIEW OF SYSTEMS:  Otherwise unremarkable.   PHYSICAL EXAMINATION:  GENERAL:  The patient is a pleasant, obese, white  male in no distress.  VITAL SIGNS:  His blood pressure is 137/79, pulse is 80 and regular, his  respirations are 18.  He is afebrile.  SATs are 98%.  HEENT EXAM:  He is balding.  He is wearing glasses.  He is  normocephalic, atraumatic.  His pupils equal, round, reactive to light  and accommodation.  Sclerae clear.  Oropharynx is clear.  NECK:  Supple without JVD, adenopathy, thyromegaly or bruits.  LUNGS:  Clear to auscultation and percussion.  CARDIAC EXAM:  Reveals a regular rate and rhythm.  Normal S1 and S2,  without gallop, murmur, rub or click.  ABDOMEN:  Obese, soft, nontender, without masses or hepatosplenomegaly.  Femoral and pedal pulses are 2+ and symmetric.  He has trace lower  extremity edema.  NEURO EXAM:  Nonfocal.   LABORATORY DATA:  Chest x-ray shows no active disease.  He  had a CT of  the chest with contrast which shows no evidence of pulmonary embolus or  aortic dissection.  There is no active disease noted.  He is noted to  have coronary calcification and calcification of the aorta.  His initial  ECG, on presentation, showed normal sinus rhythm with a nonspecific  interventricular conduction delay and LVH.  There was 3-4 mm of ST-  segment depression in the anterolateral leads.  Repeat ECG shows  resolution of ST-segment depression with residual T-wave inversion.   His white count is 12,600, hemoglobin 14.2, hematocrit 40.8, platelets  292,000.  Sodium is 131, potassium 3.8, chloride 96, CO2 27, glucose of  179, BUN 12, creatinine 1.14.  Liver function studies show an AST of 42.  The ALT could not be reported.  His CK-MB was initially 22.4, with a  troponin of 0.69.  Repeat CK is 34.3, with a troponin of 1.36.   IMPRESSION:  1. Acute non-Q wave myocardial infarction.  2. Diabetes mellitus type 2.  3. Hypertension.  4. Schizophrenia.  5. Gout.   PLAN:  The patient be admitted to step-down unit.  He will be treated  with aspirin, IV nitroglycerin, subcutaneous Lovenox and IV Integrilin.  He was loaded with IV Lopressor in the emergency department and will be  continued on  p.o. dose.  He will be placed on Protonix for ulcer prophylaxis.  He  will also be placed on sliding-scale insulin.  We will followup  echocardiogram to assess his left ventricular function.  We will plan on  cardiac catheterization tomorrow to assess his coronary risk.           ______________________________  Peter M. Swaziland, M.D.     PMJ/MEDQ  D:  08/30/2006  T:  08/30/2006  Job:  [id number]

## 2010-08-13 NOTE — Assessment & Plan Note (Signed)
OFFICE VISIT   Terrance Perez, Terrance Perez  DOB:  [birth year]                                        October 06, 2006  CHART #:  [id number]   Terrance Perez returns for his 1st postoperative visit status post coronary  artery bypass graft surgery x5 on September 03, 2006.  His postoperative  course was uncomplicated, and he has been feeling well since discharge.  He saw Dr. Swaziland recently.  He has been walking daily without chest  pain or shortness of breath.   PHYSICAL EXAMINATION:  His blood pressure is 143/86.  His pulse is 86  and regular.  Respiratory rate is 16 and unlabored.  He looks well.  Cardiac exam shows a regular rate and rhythm with normal heart sounds.  His lung exam is clear.  Chest incision is healing well, and the sternum  is stable.  His leg incision is healing well, and there is no lower  extremity edema.   Followup chest x-ray shows clear lung fields, and no pleural effusions.   His medications are:  1. Aspirin 325 mg daily.  2. Lopressor 50 mg b.i.d.  3. Benazepril 10 mg b.i.d.  4. Zocor 40 mg nightly.  5. Oxycodone p.r.n. for pain.  6. Amitriptyline 75 mg nightly.  7. Stelazine 2 mg q.a.m., and 1 mg q.p.m.  8. Glyburide/metformin 5/500 b.i.d.  9. Allopurinol 300 mg daily.  10.Travatan 1 drop both eyes 2 times per day.  11.Alphagan 1 drop both eyes b.i.d.  12.Cosopt 1 drop both eyes b.i.d.   IMPRESSION:  Overall, Terrance Perez is recovering well following his  surgery.  I encouraged him to continue walking as much as possible.  I  encouraged him to participate in Cardiac Rehab.  I told him he could  return driving a car, but should refrain from lifting anything heavier  than 10 pounds for a total of 3 months from date of surgery.  He will  continue followup with Dr. Swaziland, and will contact me if he develops  any problems with his incisions.   Evelene Croon, M.D.  Electronically Signed   BB/MEDQ  D:  10/06/2006  T:  10/06/2006  Job:  [id number]   cc:   Peter M. Swaziland, M.D.  Geoffry Paradise, M.D.

## 2010-08-13 NOTE — Discharge Summary (Signed)
NAMEMarland Kitchen  Terrance Perez, Terrance Perez NO.:  [id number]   MEDICAL RECORD NO.:  [id number]          PATIENT TYPE:  INP   LOCATION:  2008                         FACILITY:  [facility]   PHYSICIAN:  Evelene Croon, M.D.     DATE OF BIRTH:  April 06, 1933   DATE OF ADMISSION:  08/30/2006  DATE OF DISCHARGE:                               DISCHARGE SUMMARY   FINAL DIAGNOSIS:  Severe three-vessel coronary artery disease with  unstable angina and non-ST-segment-elevation myocardial infarction.   IN-HOSPITAL DIAGNOSES:  1. Acute blood loss anemia postoperatively.  2. Volume overload.   SECONDARY DIAGNOSES:  1. Diabetes mellitus type 2, followed by Dr. Arline Asp.  2. Hypertension.  3. History of glaucoma.  4. History of paranoid schizophrenia for many years, treated with      medication, well-controlled.  5. History of gout.  6. Status post cholecystectomy.  7. Status post tonsillectomy.  8. Status post repair of deviated nasal septum after breaking his      nose.   IN-HOSPITAL OPERATIONS AND PROCEDURES:  1. Cardiac catheterization with left heart catheterization, coronary      and left ventricular angiography.  2. coronary artery bypass grafting x5 using a left internal mammary      artery to left anterior descending coronary artery, saphenous vein      graft to the diagonal branch of the left anterior descending,      sequential saphenous vein graft to posterior descending and      posterolateral, and obtuse marginal coronary arteries.  3. Endoscopic vein harvesting from right leg.   HISTORY AND PHYSICAL AND HOSPITAL COURSE:  The patient is a 77 year old  gentleman with history of diabetes, hypertension, and controlled  schizophrenia, who was feeling well until evening of Aug 29, 2006, when  he developed acute onset of pain across his chest wall while assembling  an entertainment center.  The patient presented to the emergency room,  where he was found to have a troponin of 0.69 with  initial CK-MB of  22.4.  A repeat troponin was 1.36.  EKG showed significant anterolateral  ST-segment depression.  His findings on EKG eventually resolved.  The  patient was taken for cardiac catheterization by Dr. Swaziland on August 31, 2006; this showed severe three-vessel coronary artery disease.  Following catheterization, Dr. Laneta Simmers was consulted.  Dr. Laneta Simmers saw and  evaluated the patient.  He discussed with the patient undergoing  coronary bypass grafting.  Risks and benefits were discussed.  The  patient acknowledged understanding and agreed to proceed.  Surgery was  scheduled for September 03, 2006.  Preoperatively, the patient underwent  bilateral carotid duplex ultrasound, showing no significant ICA  stenosis.  He also had bilateral preop ABIs done, showing on the right  to be 1.16 and on the left to be 1.08.  The patient remained stable and  chest-pain-free preoperatively.  For details of the patient's past  medical history and physical exam, please see dictated H&P.   The patient was taken to the operating room on September 03, 2006, where he  underwent coronary artery bypass grafting x5 using a  left internal  mammary artery graft to left anterior descending coronary artery,  saphenous vein graft to diagonal branch of LAD, sequential saphenous  vein graft to posterior descending, posterolateral and obtuse marginal  coronary arteries.  Endoscopic vein harvest from right leg was done.  The patient tolerated this procedure well and was transferred to the  intensive care on stable condition.  Postoperatively, the patient was  noted to be hemodynamically stable.  He did require 1 unit of packed red  blood cells the evening of surgery.  The patient was extubated the  evening of surgery.  Post extubation, the patient was noted to be alert  and oriented x4, neuro intact.  Postop day #1, postoperative chest x-ray  was obtained and showed bilateral atelectasis.  He had mild drainage  from chest  tubes.  Chest tubes were discontinued.  Vital signs were  followed closely.  The patient still required Neo-Synephrine on postop  day #1; this was able to be weaned and discontinued.  The patient  systolic blood pressure was stable.  From a cardiac standpoint, the  patient remained in normal sinus rhythm.  His blood pressure stabilized  and he was started on beta blocker as well as restarted on his ACE  inhibitor.  Blood pressure remained stable.  Heart rate remained normal.  The patient's pulmonary status was stable.  He was encouraged to use his  incentive spirometer.  Followup chest x-ray showed improving  atelectasis.  The patient was able to be weaned off oxygen, saturating  greater than 90% on room air prior to discharge home.  The patient did  require 1 unit of packed red blood cells the evening of surgery.  Hemoglobin and hematocrit were monitored closely.  No more transfusions  were required.  The patient remained hemodynamically stable.  Last  hemoglobin and hematocrit postop day #4 were 9.1 and 26.7; the patient  was asymptomatic.  Postop day #2, the patient had complained of  difficulty swallowing secondary to a swollen uvula.  The patient's  __________  was felt due to intubation process.  The patient was  switched to full liquid diet.  Swallowing started to clear over the next  several days and the patient was placed back on regular diet.  The  patient tolerated diet well, no nausea or vomiting noted.  The patient  was out of bed, ambulating well with Rehab.  The patient was transferred  out to 2000 postop day #4 in stable condition.  The patient continued to  progress well over the next several days.  He did develop volume  overload, requiring several doses of diuretics.  He was back near  baseline prior to discharge home.  During the patient's postoperative  course, his blood sugars were followed closely.  The patient was started back on his p.o. diabetic medications.   Blood sugars were mildly  elevated and his glyburide/metformin were increased to twice a day.  Blood sugars were better controlled.   The patient is tentatively ready for discharge home on the a.m. of September 09, 2006, postop day #6.  The patient is currently afebrile with vital  signs stable.  Last labs on June 10 showed sodium of 134, potassium 3.5,  chloride of 96, bicarb of 31, BUN of 17, creatinine of 1.2, glucose of  101.  Last CBC was June 9 and showed a white count of 13.0, hemoglobin  of 9.1, hematocrit 26.7 and platelet count of 258,000.   FOLLOWUP APPOINTMENTS:  A followup  appointment will be arranged with Dr.  Laneta Simmers for in 3 weeks; our office will contact the patient with this  information.  The patient will need to follow up with Dr. Swaziland in 2  weeks; the patient will need to contact Dr. Elvis Coil office to arrange  this appointment.   ACTIVITY:  Patient is instructed in no driving until released to do so,  no heavy lifting over 10 pounds.  The patient is told to ambulate 3-4  times per day, progress as tolerated and continue his breathing  exercises.   INCISIONAL CARE:  The patient is told to shower, washing his incisions  using soap and water.  He is to contact the office if he develops any  drainage or opening from any of his incision sites.   DIET:  The patient was educated on diet to be low-fat, low-salt.   DISCHARGE MEDICATIONS:  1. Lasix 40 mg daily for 5 days.  2. Potassium chloride 20 mEq daily for 5 days.  3. Aspirin 325 mg daily.  4. Lopressor 50 mg b.i.d.  5. Benazepril 10 mg b.i.d.  6. Zocor 40 mg at night.  7. Cosopt one drop in both eyes b.i.d.  8. Alphagan one drop in both eyes b.i.d.  9. Travatan on drops in both eyes b.i.d.  10.Trifluoperazine 2 mg in a.m., 1 mg in p.m.  11.Amitriptyline 75 mg at night.  12.Glyburide/metformin 5/500 mg b.i.d.  13.Allopurinol 30 mg daily.  14.Oxycodone 5 mg one to two tablets q.4-6 h. p.r.n. pain.       Theda Belfast, [place]      Evelene Croon, M.D.  Electronically Signed    KMD/MEDQ  D:  09/08/2006  T:  09/09/2006  Job:  [id number]   cc:   Peter M. Swaziland, M.D.

## 2010-08-13 NOTE — Procedures (Signed)
CAROTID DUPLEX EXAM   INDICATION:  Syncope.  Patient has been experiencing one syncopal event  approximately every two months for the past year while at rest.  Patient  is now with monitor.   HISTORY:  Diabetes:  Yes, orally controlled.  Cardiac:  CABG x5 on September 03, 2006.  Hypertension:  Yes.  Smoking:  No.  Previous Surgery:  Cardiac.  CV History:  Amaurosis Fugax No, Paresthesias No, Hemiparesis No                                       RIGHT             LEFT  Brachial systolic pressure:         118               126  Brachial Doppler waveforms:         WNL               WNL  Vertebral direction of flow:        Antegrade         Antegrade  DUPLEX VELOCITIES (cm/sec)  CCA peak systolic                   90                90  ECA peak systolic                   106               89  ICA peak systolic                   76                56  ICA end diastolic                   17                14  PLAQUE MORPHOLOGY:                  Calcific          Homogenous  PLAQUE AMOUNT:                      Mild              Mild  PLAQUE LOCATION:                    Bifurcation, ICA  PICA   IMPRESSION:  1. Bilateral 1-39% internal carotid artery stenosis.  2. Patent external carotid arteries and vertebral arteries.  3. Study essentially unchanged from that on 12/02/05.   ___________________________________________  Larina Earthly, M.D.   PB/MEDQ  D:  04/29/2007  T:  04/29/2007  Job:  [id number]

## 2010-08-23 ENCOUNTER — Other Ambulatory Visit: Payer: Self-pay | Admitting: Cardiology

## 2010-08-23 ENCOUNTER — Telehealth: Payer: Self-pay | Admitting: Cardiology

## 2010-08-23 NOTE — Telephone Encounter (Signed)
Potassium refilled

## 2010-08-23 NOTE — Telephone Encounter (Signed)
Recvd call from patient stating that [facility] requested refill of potassium liquid.  He is completely out.  Please call in.

## 2010-09-16 ENCOUNTER — Encounter: Payer: Self-pay | Admitting: Cardiology

## 2010-09-20 ENCOUNTER — Encounter: Payer: Self-pay | Admitting: Cardiology

## 2010-09-20 ENCOUNTER — Ambulatory Visit (INDEPENDENT_AMBULATORY_CARE_PROVIDER_SITE_OTHER): Payer: Medicare Other | Admitting: Cardiology

## 2010-09-20 DIAGNOSIS — I679 Cerebrovascular disease, unspecified: Secondary | ICD-10-CM

## 2010-09-20 DIAGNOSIS — I5189 Other ill-defined heart diseases: Secondary | ICD-10-CM

## 2010-09-20 DIAGNOSIS — I10 Essential (primary) hypertension: Secondary | ICD-10-CM

## 2010-09-20 DIAGNOSIS — I2510 Atherosclerotic heart disease of native coronary artery without angina pectoris: Secondary | ICD-10-CM

## 2010-09-20 DIAGNOSIS — E785 Hyperlipidemia, unspecified: Secondary | ICD-10-CM

## 2010-09-20 DIAGNOSIS — I519 Heart disease, unspecified: Secondary | ICD-10-CM

## 2010-09-20 DIAGNOSIS — E119 Type 2 diabetes mellitus without complications: Secondary | ICD-10-CM

## 2010-09-20 NOTE — Patient Instructions (Signed)
Continue your current medication.  I will see you back in 4 months.

## 2010-09-21 DIAGNOSIS — E785 Hyperlipidemia, unspecified: Secondary | ICD-10-CM | POA: Insufficient documentation

## 2010-09-21 DIAGNOSIS — Z951 Presence of aortocoronary bypass graft: Secondary | ICD-10-CM | POA: Insufficient documentation

## 2010-09-21 DIAGNOSIS — I10 Essential (primary) hypertension: Secondary | ICD-10-CM | POA: Insufficient documentation

## 2010-09-21 DIAGNOSIS — E119 Type 2 diabetes mellitus without complications: Secondary | ICD-10-CM | POA: Insufficient documentation

## 2010-09-21 DIAGNOSIS — I5189 Other ill-defined heart diseases: Secondary | ICD-10-CM | POA: Insufficient documentation

## 2010-09-21 DIAGNOSIS — I679 Cerebrovascular disease, unspecified: Secondary | ICD-10-CM | POA: Insufficient documentation

## 2010-09-21 NOTE — Assessment & Plan Note (Signed)
He continues to have significant leg weakness. I recommended holding his simvastatin for 2 more weeks. If there is no improvement then I think we can resume his statin therapy.

## 2010-09-21 NOTE — Progress Notes (Signed)
Terrance Perez Date of Birth: 16-Nov-1933   History of Present Illness: Terrance Perez is seen today for followup. He is doing much better with change in his psychiatric medication. His anxiety is much less and as a result of his prior symptoms of dyspnea have improved significantly. He denies any chest pain. He has had no significant palpitations. He does report symptoms of significant leg weakness. He stopped his simvastatin 2 weeks ago and so far has noticed no change.  Current Outpatient Prescriptions on File Prior to Visit  Medication Sig Dispense Refill  . allopurinol (ZYLOPRIM) 300 MG tablet Take 300 mg by mouth daily.        [name] amitriptyline (ELAVIL) 75 MG tablet Take 75 mg by mouth as directed. 25 mg alternating with 50mg       . amLODipine (NORVASC) 5 MG tablet Take 5 mg by mouth 2 (two) times daily.        . benztropine (COGENTIN) 1 MG tablet Take 0.5 mg by mouth 2 (two) times daily.        . brimonidine (ALPHAGAN P) 0.1 % SOLN Apply to eye 2 (two) times daily.        . clopidogrel (PLAVIX) 75 MG tablet Take 75 mg by mouth daily.        . dorzolamide-timolol (COSOPT) 22.3-6.8 MG/ML ophthalmic solution Place 1 drop into both eyes 2 (two) times daily.        . furosemide (LASIX) 40 MG tablet Take 40 mg by mouth daily.        [name] glyBURIDE-metformin (GLUCOVANCE) 5-500 MG per tablet Take 2 tablets by mouth daily with breakfast. 2 TABLET IN THE AM, 1 TABLET IN THE PM      . metoprolol (LOPRESSOR) 50 MG tablet Take 1 tablet (50 mg total) by mouth 2 (two) times daily.  60 tablet  5  . polyethylene glycol (MIRALAX / GLYCOLAX) packet Take 17 g by mouth as needed.        . potassium chloride 20 MEQ/15ML (10%) solution TAKE THREE TEASPOONSFUL BY MOUTH EVERY DAY  500 mL  3  . QUEtiapine (SEROQUEL) 50 MG tablet Take 50 mg by mouth 4 (four) times daily.        . travoprost, benzalkonium, (TRAVATAN) 0.004 % ophthalmic solution Place 1 drop into both eyes at bedtime.        [name] trifluoperazine  (STELAZINE) 1 MG tablet Take 2 mg by mouth 2 (two) times daily.          No Known Allergies  Past Medical History  Diagnosis Date  . Coronary artery disease   . Diabetes mellitus   . Hypertension   . Hyperlipidemia   . CVD (cerebrovascular disease)   . Gout   . Glaucoma   . Paranoid schizophrenia   . Tremor   . History of orthostatic hypotension   . Dyspnea   . Diastolic dysfunction     Past Surgical History  Procedure Date  . Cardiac catheterization 08/31/2006    MODERATE INFERIOR WALL HYPOKINESIA WITH EF 45%  . Coronary artery bypass graft 2008    LIMA GRAFT TO THE LAD, SAPHENOUS VEIN GRAFT TO THE DIAGONAL , SEQUENTIAL SAPHENOUS VEIN GRAFT TO THE PDA, POSTERIOR LATERAL , AND OBTUSE MARGINAL VESSELS.  . Tonsillectomy   . Asd repair   . Cholecystectomy     History  Smoking status  . Former Smoker  . Quit date: 03/31/1958  Smokeless tobacco  . Not on file  History  Alcohol Use No    History reviewed. No pertinent family history.  Review of Systems: The review of systems is positive for lower extremity weakness.  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 He complains of blurred vision and some headaches.All other systems were reviewed and are negative.  Physical Exam: BP 122/78  Pulse 80  Ht 5\' 7"  (1.702 m)  Wt 210 lb 6.4 oz (95.437 kg)  BMI 32.95 kg/m2 He is an elderly white male in no acute distress. He wears glasses. His pupils are equal and reactive. Oropharynx is clear. Neck supple no JVD, adenopathy, thyromegaly, or bruits. Lungs are clear. Cardiac exam reveals a regular rate and rhythm without gallop or murmur. Abdomen is obese and nontender. He has trace ankle edema. His tremor has improved significantly. LABORATORY DATA:   Assessment / Plan:

## 2010-09-21 NOTE — Assessment & Plan Note (Signed)
He does not appear to be volume overloaded at this time. Continue with sodium restriction.

## 2010-09-21 NOTE — Assessment & Plan Note (Signed)
He is having no significant anginal symptoms. His previous bouts of acute dyspnea have resolved with improvement in his anxiety and panic attacks. He has had no air hunger. We will continue with his current medical therapy and followup again in 4 months.

## 2010-09-25 ENCOUNTER — Telehealth: Payer: Self-pay | Admitting: Cardiology

## 2010-09-25 NOTE — Telephone Encounter (Signed)
Has a question about his medications/supplements that they forgot to ask the last time they were here.

## 2010-09-26 NOTE — Telephone Encounter (Signed)
Wife called wanting to know if it was OK for [name] to use Omega 3 fish oil. She wanted to be sure that wouldn"t interact w/ other meds since he has been having hallucinations with other meds. Per Dr. Swaziland would be OK to take fish oil.

## 2010-11-29 ENCOUNTER — Emergency Department (HOSPITAL_COMMUNITY): Payer: Medicare Other

## 2010-11-29 ENCOUNTER — Emergency Department (HOSPITAL_COMMUNITY)
Admission: EM | Admit: 2010-11-29 | Discharge: 2010-11-29 | Disposition: A | Discharge status: Home / Self Care | Payer: Medicare Other | Attending: Emergency Medicine | Admitting: Emergency Medicine

## 2010-11-29 DIAGNOSIS — K5900 Constipation, unspecified: Secondary | ICD-10-CM | POA: Insufficient documentation

## 2010-11-29 DIAGNOSIS — I25810 Atherosclerosis of coronary artery bypass graft(s) without angina pectoris: Secondary | ICD-10-CM | POA: Insufficient documentation

## 2010-11-29 DIAGNOSIS — Z79899 Other long term (current) drug therapy: Secondary | ICD-10-CM | POA: Insufficient documentation

## 2010-11-29 DIAGNOSIS — I10 Essential (primary) hypertension: Secondary | ICD-10-CM | POA: Insufficient documentation

## 2010-11-29 DIAGNOSIS — R55 Syncope and collapse: Secondary | ICD-10-CM | POA: Insufficient documentation

## 2010-11-29 DIAGNOSIS — Z7901 Long term (current) use of anticoagulants: Secondary | ICD-10-CM | POA: Insufficient documentation

## 2010-11-29 DIAGNOSIS — E119 Type 2 diabetes mellitus without complications: Secondary | ICD-10-CM | POA: Insufficient documentation

## 2010-11-29 LAB — POCT I-STAT, CHEM 8
Calcium, Ion: 1.14 mmol/L (ref 1.12–1.32)
Creatinine, Ser: 1.1 mg/dL (ref 0.50–1.35)
Glucose, Bld: 261 mg/dL — ABNORMAL HIGH (ref 70–99)
Hemoglobin: 13.9 g/dL (ref 13.0–17.0)
Potassium: 3.7 mEq/L (ref 3.5–5.1)
TCO2: 25 mmol/L (ref 0–100)

## 2010-12-12 ENCOUNTER — Encounter: Payer: Self-pay | Admitting: Cardiovascular Disease

## 2010-12-12 ENCOUNTER — Telehealth: Payer: Self-pay | Admitting: Cardiology

## 2010-12-12 ENCOUNTER — Ambulatory Visit (INDEPENDENT_AMBULATORY_CARE_PROVIDER_SITE_OTHER): Payer: Medicare Other | Admitting: Cardiovascular Disease

## 2010-12-12 VITALS — BP 110/66 | HR 78 | Wt 215.6 lb

## 2010-12-12 DIAGNOSIS — R079 Chest pain, unspecified: Secondary | ICD-10-CM

## 2010-12-12 DIAGNOSIS — I2510 Atherosclerotic heart disease of native coronary artery without angina pectoris: Secondary | ICD-10-CM

## 2010-12-12 DIAGNOSIS — R55 Syncope and collapse: Secondary | ICD-10-CM | POA: Insufficient documentation

## 2010-12-12 NOTE — Assessment & Plan Note (Signed)
This episode of syncope is very unusual. The episode started in his lower abdomen and it radiated up to his throat. He had a choking-like sensation. He then blacked out for a very brief time. He was lying down in his recliner at the time.  None of his symptoms sound ischemic and I do not think that we need to admit him to the hospital.. I do think it is reasonable to do an adenosine Myoview study.  We'll place an event monitor on him. We will get carotid duplex scan.  Get an echocardiogram.  He will return to see Dr. Swaziland several weeks. She will call sooner if he has any problems.

## 2010-12-12 NOTE — Telephone Encounter (Signed)
Was checking out today and the EPIC checkout Follow-up disposition: notes are different from what was written on his router. I tentatively scheduled him to come back on Oct. 23 but was uncomfortable because that placed him five weeks from today's appointment. Please call back.

## 2010-12-12 NOTE — Assessment & Plan Note (Signed)
His symptoms are very atypical and do not sound like ongoing ischemia. His symptoms are clearly different than his myocardial infarction that he had several years ago.

## 2010-12-12 NOTE — Progress Notes (Signed)
Terrance Perez Date of Birth  April 22, 1933 [facility] / [facility] [address]     [address] [place], [place]  [address] [phone]  Fax  [phone]  History of Present Illness:  77 year old gentleman with a history of coronary artery disease and coronary artery bypass grafting. He presents at the request of Dr. Jacky Kindle today. He's been having some unusual sensations with a lower body pain including abdominal pain that then radiates up into his chest.  His episodes occur at any time. They're not associated with exercise, eating, drinking or change of position. Initially the episodes started when he was lying down in bed.  His episodes have been occurring off and on for the past 2-3 weeks. His episodes seem to have started after he developed constipation.  He had an episode of syncope today.   The episode started with some abdominal pain in that then radiated up to his chest. He developed a choking-like sensation. He blacked out after that.  He was lying down in a recliner at that time.  There were no postictal symptoms. He feels fine now.  He's fairly limited and does not get any regular exercise. He has some chronic back problems.    It was somewhat difficult to get an accurate history from him. His description of his problem is somewhat vague.  Current Outpatient Prescriptions on File Prior to Visit  Medication Sig Dispense Refill  . allopurinol (ZYLOPRIM) 300 MG tablet Take 300 mg by mouth daily.        [name] amLODipine (NORVASC) 5 MG tablet Take 5 mg by mouth 2 (two) times daily.        . benztropine (COGENTIN) 1 MG tablet Take by mouth daily. Taking 0.5 in AM and 1 Tab in PM      . brimonidine (ALPHAGAN P) 0.1 % SOLN Apply to eye 2 (two) times daily.        . clopidogrel (PLAVIX) 75 MG tablet Take 75 mg by mouth daily.        . dorzolamide-timolol (COSOPT) 22.3-6.8 MG/ML ophthalmic solution Place 1 drop into both eyes 2 (two) times daily.        .  furosemide (LASIX) 40 MG tablet Take 40 mg by mouth daily.        [name] glyBURIDE-metformin (GLUCOVANCE) 5-500 MG per tablet Take 2 tablets by mouth daily with breakfast. 2 TABLET IN THE AM, 1 TABLET IN THE PM      . metoprolol (LOPRESSOR) 50 MG tablet Take 1 tablet (50 mg total) by mouth 2 (two) times daily.  60 tablet  5  . polyethylene glycol (MIRALAX / GLYCOLAX) packet Take 17 g by mouth as needed.        . potassium chloride 20 MEQ/15ML (10%) solution TAKE THREE TEASPOONSFUL BY MOUTH EVERY DAY  500 mL  3  . QUEtiapine (SEROQUEL) 50 MG tablet Take 50 mg by mouth at bedtime. Taking 2 at 6am, 1 and noon, 1 and 3pm, 2 at 7pm.      . simvastatin (ZOCOR) 40 MG tablet       . travoprost, benzalkonium, (TRAVATAN) 0.004 % ophthalmic solution Place 1 drop into both eyes at bedtime.          No Known Allergies  Past Medical History  Diagnosis Date  . Coronary artery disease   . Diabetes mellitus   . Hypertension   . Hyperlipidemia   . CVD (cerebrovascular disease)   . Gout   .  Glaucoma   . Paranoid schizophrenia   . Tremor   . History of orthostatic hypotension   . Dyspnea   . Diastolic dysfunction     Past Surgical History  Procedure Date  . Cardiac catheterization 08/31/2006    MODERATE INFERIOR WALL HYPOKINESIA WITH EF 45%  . Coronary artery bypass graft 2008    LIMA GRAFT TO THE LAD, SAPHENOUS VEIN GRAFT TO THE DIAGONAL , SEQUENTIAL SAPHENOUS VEIN GRAFT TO THE PDA, POSTERIOR LATERAL , AND OBTUSE MARGINAL VESSELS.  . Tonsillectomy   . Asd repair   . Cholecystectomy     History  Smoking status  . Former Smoker  . Quit date: 03/31/1958  Smokeless tobacco  . Not on file    History  Alcohol Use No    No family history on file.  Reviw of Systems:  Reviewed in the HPI.  All other systems are negative.  Physical Exam: BP 110/66  Pulse 78  Wt 215 lb 9.6 oz (97.796 kg) The patient is alert and oriented x 3.  The mood and affect are normal.   Skin: warm and dry.  Color is  normal.    HEENT:   the sclera are nonicteric.  The mucous membranes are moist.  The carotids are 2+ without bruits.  There is no thyromegaly.  There is no JVD.    Lungs: clear.  The chest wall is non tender.    Heart: regular rate with a normal S1 and S2.  There are no murmurs, gallops, or rubs. The PMI is not displaced.     Abdomen: good bowel sounds.  There is no guarding or rebound.  There is no hepatosplenomegaly or tenderness.  There are no masses.   Extremities:  no clubbing, cyanosis, or edema.  The legs are without rashes.  The distal pulses are intact.   Neuro:  Cranial nerves II - XII are intact.  Motor and sensory functions are intact.    He walks with the assistance of a walker.  ECG: Normal sinus rhythm. He has a left bundle branch block with associated ST and T wave changes.   Assessment / Plan:

## 2010-12-12 NOTE — Patient Instructions (Addendum)
Return for app with dr [place] in 2-3 weeks.  Call with any questions regarding heart monitor to be worn for 30 days.  Tests all to be done on October 2 starting at 10:15 am

## 2010-12-13 ENCOUNTER — Encounter: Payer: Self-pay | Admitting: Cardiovascular Disease

## 2010-12-18 ENCOUNTER — Encounter: Payer: Self-pay | Admitting: Cardiology

## 2010-12-18 ENCOUNTER — Encounter: Payer: Self-pay | Admitting: *Deleted

## 2010-12-20 ENCOUNTER — Other Ambulatory Visit: Payer: Self-pay | Admitting: Internal Medicine

## 2010-12-20 ENCOUNTER — Ambulatory Visit
Admission: RE | Admit: 2010-12-20 | Discharge: 2010-12-20 | Disposition: A | Discharge status: Home / Self Care | Payer: Medicare Other | Source: Ambulatory Visit | Attending: Internal Medicine | Admitting: Internal Medicine

## 2010-12-31 ENCOUNTER — Encounter: Payer: Medicare Other | Admitting: *Deleted

## 2010-12-31 ENCOUNTER — Ambulatory Visit (HOSPITAL_COMMUNITY): Payer: Medicare Other | Attending: Cardiology

## 2010-12-31 ENCOUNTER — Ambulatory Visit (HOSPITAL_COMMUNITY): Payer: Medicare Other | Attending: Cardiology | Admitting: Radiology

## 2010-12-31 DIAGNOSIS — R55 Syncope and collapse: Secondary | ICD-10-CM | POA: Insufficient documentation

## 2010-12-31 DIAGNOSIS — I079 Rheumatic tricuspid valve disease, unspecified: Secondary | ICD-10-CM | POA: Insufficient documentation

## 2010-12-31 DIAGNOSIS — E785 Hyperlipidemia, unspecified: Secondary | ICD-10-CM | POA: Insufficient documentation

## 2010-12-31 DIAGNOSIS — I447 Left bundle-branch block, unspecified: Secondary | ICD-10-CM

## 2010-12-31 DIAGNOSIS — I25810 Atherosclerosis of coronary artery bypass graft(s) without angina pectoris: Secondary | ICD-10-CM

## 2010-12-31 DIAGNOSIS — I2510 Atherosclerotic heart disease of native coronary artery without angina pectoris: Secondary | ICD-10-CM | POA: Insufficient documentation

## 2010-12-31 DIAGNOSIS — R079 Chest pain, unspecified: Secondary | ICD-10-CM

## 2010-12-31 DIAGNOSIS — I10 Essential (primary) hypertension: Secondary | ICD-10-CM | POA: Insufficient documentation

## 2010-12-31 DIAGNOSIS — E119 Type 2 diabetes mellitus without complications: Secondary | ICD-10-CM | POA: Insufficient documentation

## 2010-12-31 MED ORDER — ADENOSINE (DIAGNOSTIC) 3 MG/ML IV SOLN
0.5600 mg/kg | Freq: Once | INTRAVENOUS | Status: AC
  Administered 2010-12-31: 55.2 mg via INTRAVENOUS

## 2010-12-31 MED ORDER — TECHNETIUM TC 99M TETROFOSMIN IV KIT
10.6000 | PACK | Freq: Once | INTRAVENOUS | Status: AC | PRN
  Administered 2010-12-31: 11 via INTRAVENOUS

## 2010-12-31 MED ORDER — TECHNETIUM TC 99M TETROFOSMIN IV KIT
33.0000 | PACK | Freq: Once | INTRAVENOUS | Status: AC | PRN
  Administered 2010-12-31: 33 via INTRAVENOUS

## 2010-12-31 NOTE — Progress Notes (Signed)
[facility] SITE 3 NUCLEAR MED [address] [place] [address] [phone]  Cardiology Nuclear Med Study  Terrance Perez is a 77 y.o. male [id number] 08-Sep-1933   Nuclear Med Background Indication for Stress Test:  Evaluation for Ischemia and Graft Patency History: '08 CABG, 02/11 Echo: EF 50%, 06/08 Heart Catheterization, 06/08 Myocardial Infarction: NON Q wave MI and 05/11Myocardial Perfusion Study:NL Cardiac Risk Factors: CVA, History of Smoking, Hypertension, LBBB, Lipids, NIDDM, PVD and TIA  Symptoms:  Chest Pain, Dizziness and Syncope   Nuclear Pre-Procedure Caffeine/Decaff Intake:  None NPO After: 6:00am   Lungs:  clear IV 0.9% NS with Angio Cath:  22g  IV Site: R Hand  IV Started by:  Cathlyn Parsons, RN  Chest Size (in):  44 Cup Size: n/a  Height: 5\' 7"  (1.702 m)  Weight:  217 lb (98.431 kg)  BMI:  Body mass index is 33.99 kg/(m^2). Tech Comments:  Metoprolol held x 36 hrs    Nuclear Med Study 1 or 2 day study: 1 day  Stress Test Type:  Adenosine  Reading MD: Willa Rough, MD  Order Authorizing Provider:  P.Jordan  Resting Radionuclide: Technetium 2m Tetrofosmin  Resting Radionuclide Dose: 10.6 mCi   Stress Radionuclide:  Technetium 50m Tetrofosmin  Stress Radionuclide Dose: 33 mCi           Stress Protocol Rest HR: 86 Stress HR: 99  Rest BP: 133/66 Stress BP: 130/66  Exercise Time (min): n/a METS: n/a   Predicted Max HR: 146 bpm % Max HR: 67.81 bpm Rate Pressure Product: 46962   Dose of Adenosine (mg):  55.2 Dose of Lexiscan: n/a mg  Dose of Atropine (mg): n/a Dose of Dobutamine: n/a mcg/kg/min (at max HR)  Stress Test Technologist: Milana Na, EMT-P  Nuclear Technologist:  Domenic Polite, CNMT     Rest Procedure:  Myocardial perfusion imaging was performed at rest 45 minutes following the intravenous administration of Technetium 63m Tetrofosmin. Rest ECG: NSR-LBBB  Stress Procedure:  The patient received IV  adenosine at 140 mcg/kg/min for 4 minutes. 2nd degree AVB with infusion. There were Non Diagnostic 2nd to LBBB.  Technetium 42m Tetrofosmin was injected at the 2 minute mark and quantitative spect images were obtained after a 45 minute delay. Stress ECG: No significant ST segment change suggestive of ischemia.  QPS Raw Data Images:  Normal; no motion artifact; normal heart/lung ratio. Stress Images:  There is a small moderately severe apical lateral defect with normal perfusion in the other areas. Rest Images:  There is a very small , very mild apical lateral defect with normal uptake in the remaing segments. Subtraction (SDS):  There is evidence of a small area of apical lateral ischemia. Transient Ischemic Dilatation (Normal <1.22):  1.12 Lung/Heart Ratio (Normal <0.45):  .30  Quantitative Gated Spect Images QGS EDV:  99 ml QGS ESV:  43 ml QGS cine images:  NL LV Function; NL Wall Motion QGS EF: 56%  Impression Exercise Capacity:  Adenosine study with no exercise. BP Response:  Normal blood pressure response. Clinical Symptoms:  No chest pain. ECG Impression:  No significant ST segment change suggestive of ischemia. Comparison with Prior Nuclear Study: No images to compare  Overall Impression:  Abnormal stress nuclear study.  There is a small area of mild  apical lateral ischemia.  The LV function is normal with an EF of 56%.    Vesta Mixer, Montez Hageman., MD, [facility]   .

## 2011-01-01 ENCOUNTER — Other Ambulatory Visit: Payer: Self-pay | Admitting: Cardiology

## 2011-01-02 ENCOUNTER — Telehealth: Payer: Self-pay | Admitting: *Deleted

## 2011-01-02 ENCOUNTER — Telehealth: Payer: Self-pay | Admitting: Cardiology

## 2011-01-02 NOTE — Telephone Encounter (Signed)
Message copied by Lorayne Bender on Thu Jan 02, 2011  2:02 PM ------      Message from: [place], PETER M      Created: Thu Jan 02, 2011  9:33 AM       Myoview study consistent with apical lateral ischemia. EF is normal. Will need to see back. Prior symptoms pretty atypical. Will need to consider medical RX versus cath.      Theron Arista [place]

## 2011-01-02 NOTE — Telephone Encounter (Signed)
Pt returning call to [facility] regarding pt stress test results.  Please call back.

## 2011-01-02 NOTE — Telephone Encounter (Signed)
Spoke w/wife to notify her of his Myoview results. Also changed his app from 10/12 to 10/16 due to Dr. Elvis Coil schedule change.

## 2011-01-10 ENCOUNTER — Ambulatory Visit: Payer: Medicare Other | Admitting: Cardiology

## 2011-01-14 ENCOUNTER — Ambulatory Visit (INDEPENDENT_AMBULATORY_CARE_PROVIDER_SITE_OTHER): Payer: Medicare Other | Admitting: Cardiology

## 2011-01-14 ENCOUNTER — Encounter: Payer: Self-pay | Admitting: Cardiology

## 2011-01-14 VITALS — BP 142/84 | HR 75 | Ht 67.0 in | Wt 219.0 lb

## 2011-01-14 DIAGNOSIS — I2510 Atherosclerotic heart disease of native coronary artery without angina pectoris: Secondary | ICD-10-CM

## 2011-01-14 DIAGNOSIS — E119 Type 2 diabetes mellitus without complications: Secondary | ICD-10-CM

## 2011-01-14 DIAGNOSIS — E785 Hyperlipidemia, unspecified: Secondary | ICD-10-CM

## 2011-01-14 DIAGNOSIS — R55 Syncope and collapse: Secondary | ICD-10-CM

## 2011-01-14 DIAGNOSIS — M1230 Palindromic rheumatism, unspecified site: Secondary | ICD-10-CM

## 2011-01-14 MED ORDER — SIMVASTATIN 20 MG PO TABS: 20.0000 mg | ORAL_TABLET | Freq: Every evening | ORAL | Status: DC

## 2011-01-14 NOTE — Assessment & Plan Note (Signed)
We had previously stopped the simvastatin to see if his leg pain and weakness would improve but this made no difference. We will reduce his dose to 20 mg per day since he is on amlodipine.

## 2011-01-14 NOTE — Progress Notes (Signed)
Terrance Perez Date of Birth: 18-Jul-1933   History of Present Illness: Terrance Perez is seen today for followup. He was evaluated by Dr. Elease Hashimoto in September with symptoms of abdominal pain that radiated up into his chest. As the time when he was experiencing significant constipation. He has been on MiraLax with improvement in his constipation symptoms and resolution of his abdominal discomfort. This was described as a tightness or tension in his abdomen. He states that he has no chest pain to speak of now. He had extensive cardiac evaluation including an echocardiogram which was unremarkable. He had normal ejection fraction. He had a nuclear stress test that showed a small apical lateral defect with a normal ejection fraction. An event monitor showed no significant arrhythmias.  Current Outpatient Prescriptions on File Prior to Visit  Medication Sig Dispense Refill  . allopurinol (ZYLOPRIM) 300 MG tablet Take 300 mg by mouth daily.        [name] amLODipine (NORVASC) 5 MG tablet Take 5 mg by mouth 2 (two) times daily.        . benztropine (COGENTIN) 1 MG tablet Take by mouth daily. Taking 0.5 in AM and 1 Tab in PM      . brimonidine (ALPHAGAN P) 0.1 % SOLN Apply to eye 2 (two) times daily.        . clopidogrel (PLAVIX) 75 MG tablet Take 75 mg by mouth daily.        . dorzolamide-timolol (COSOPT) 22.3-6.8 MG/ML ophthalmic solution Place 1 drop into both eyes 2 (two) times daily.        . furosemide (LASIX) 40 MG tablet Take 40 mg by mouth daily.        [name] glyBURIDE-metformin (GLUCOVANCE) 5-500 MG per tablet Take 2 tablets by mouth daily with breakfast. 2 TABLET IN THE AM, 1 TABLET IN THE PM      . metoprolol (LOPRESSOR) 50 MG tablet TAKE ONE TABLET BY MOUTH TWICE DAILY  60 tablet  5  . polyethylene glycol (MIRALAX / GLYCOLAX) packet Take 17 g by mouth as needed.        . potassium chloride 20 MEQ/15ML (10%) solution TAKE THREE TEASPOONSFUL BY MOUTH EVERY DAY  500 mL  3  . QUEtiapine (SEROQUEL) 50 MG  tablet Take 50 mg by mouth at bedtime. Taking 2 at 6am, 1 and noon, 1 and 3pm, 2 at 7pm.      . travoprost, benzalkonium, (TRAVATAN) 0.004 % ophthalmic solution Place 1 drop into both eyes at bedtime.          No Known Allergies  Past Medical History  Diagnosis Date  . Coronary artery disease   . Diabetes mellitus   . Hypertension   . Hyperlipidemia   . CVD (cerebrovascular disease)   . Gout   . Glaucoma   . Paranoid schizophrenia   . Tremor   . History of orthostatic hypotension   . Dyspnea   . Diastolic dysfunction     Past Surgical History  Procedure Date  . Cardiac catheterization 08/31/2006    MODERATE INFERIOR WALL HYPOKINESIA WITH EF 45%  . Coronary artery bypass graft 2008    LIMA GRAFT TO THE LAD, SAPHENOUS VEIN GRAFT TO THE DIAGONAL , SEQUENTIAL SAPHENOUS VEIN GRAFT TO THE PDA, POSTERIOR LATERAL , AND OBTUSE MARGINAL VESSELS.  . Tonsillectomy   . Asd repair   . Cholecystectomy     History  Smoking status  . Former Smoker  . Quit date: 03/31/1958  Smokeless tobacco  .  Not on file    History  Alcohol Use No    History reviewed. No pertinent family history.  Review of Systems: The review of systems is positive for lower extremity weakness.  His family is concerned about him taking simvastatin. He complains of blurred vision and some headaches.All other systems were reviewed and are negative.  Physical Exam: BP 142/84  Pulse 75  Ht 5\' 7"  (1.702 m)  Wt 219 lb (99.338 kg)  BMI 34.30 kg/m2 He is an elderly white male in no acute distress. He wears glasses. His pupils are equal and reactive. Oropharynx is clear. Neck supple no JVD, adenopathy, thyromegaly, or bruits. Lungs are clear. Cardiac exam reveals a regular rate and rhythm without gallop or murmur. Abdomen is obese and nontender. He has trace ankle edema. His tremor has improved significantly. He is alert and oriented x3. Cranial nerves II through XII are intact. LABORATORY DATA: As noted in history of  present illness.  Assessment / Plan:

## 2011-01-14 NOTE — Assessment & Plan Note (Signed)
His nuclear stress test was low risk with only a small distal apical lateral defect. This could be artifactual. He is having no significant anginal symptoms and given his multiple comorbidities I would not recommend invasive evaluation. We will continue with his medical therapy.

## 2011-01-14 NOTE — Patient Instructions (Signed)
We will reduce your simvastatin to 20 mg daily.  Continue your other medications.  I will see you again in 4 months.

## 2011-01-14 NOTE — Assessment & Plan Note (Signed)
His echocardiogram and event monitor were unremarkable. He is still scheduled for carotid Doppler studies. I suspect this is vagally mediated with his constipation.

## 2011-01-15 ENCOUNTER — Telehealth: Payer: Self-pay | Admitting: *Deleted

## 2011-01-15 NOTE — Telephone Encounter (Signed)
Pt had follow up with Dr [place] yesterday results of ecardio was given then.

## 2011-01-16 LAB — BASIC METABOLIC PANEL
BUN: 10
BUN: 12
BUN: 14
BUN: 17
CO2: 25
CO2: 25
CO2: 26
CO2: 29
CO2: 32
Calcium: 7.7 — ABNORMAL LOW
Calcium: 7.7 — ABNORMAL LOW
Calcium: 8 — ABNORMAL LOW
Chloride: 102
Chloride: 104
Chloride: 110
Chloride: 96
Chloride: 97
Chloride: 98
Creatinine, Ser: 1.1
Creatinine, Ser: 1.12
GFR calc Af Amer: >60
GFR calc Af Amer: >60
GFR calc Af Amer: >60
GFR calc Af Amer: >60
GFR calc Af Amer: >60
GFR calc non Af Amer: 56 — ABNORMAL LOW
GFR calc non Af Amer: >60
GFR calc non Af Amer: >60
Glucose, Bld: 101 — ABNORMAL HIGH
Glucose, Bld: 121 — ABNORMAL HIGH
Glucose, Bld: 138 — ABNORMAL HIGH
Potassium: 3.1 — ABNORMAL LOW
Potassium: 3.5
Potassium: 3.6
Potassium: 3.7
Potassium: 4.4
Potassium: 4.7
Sodium: 129 — ABNORMAL LOW
Sodium: 134 — ABNORMAL LOW
Sodium: 134 — ABNORMAL LOW
Sodium: 134 — ABNORMAL LOW
Sodium: 138

## 2011-01-16 LAB — COMPREHENSIVE METABOLIC PANEL
ALT: 27
ALT: 31
AST: 42 — ABNORMAL HIGH
Albumin: 3.6
Alkaline Phosphatase: 57
Alkaline Phosphatase: 80
CO2: 24
Chloride: 102
Chloride: 96
Glucose, Bld: 99
Potassium: 3.6
Potassium: 3.8
Sodium: 131 — ABNORMAL LOW
Sodium: 133 — ABNORMAL LOW
Total Bilirubin: 0.9
Total Protein: 5.8 — ABNORMAL LOW
Total Protein: 7.1

## 2011-01-16 LAB — CBC
HCT: 26.7 — ABNORMAL LOW
HCT: 29.1 — ABNORMAL LOW
HCT: 29.8 — ABNORMAL LOW
HCT: 32.4 — ABNORMAL LOW
HCT: 34.8 — ABNORMAL LOW
Hemoglobin: 10.1 — ABNORMAL LOW
Hemoglobin: 11.2 — ABNORMAL LOW
Hemoglobin: 11.3 — ABNORMAL LOW
Hemoglobin: 9.1 — ABNORMAL LOW
Hemoglobin: 9.5 — ABNORMAL LOW
Hemoglobin: 9.9 — ABNORMAL LOW
MCHC: 33.7
MCHC: 33.8
MCHC: 34.3
MCHC: 34.4
MCHC: 34.7
MCV: 89
MCV: 89.8
MCV: 90.2
MCV: 90.5
MCV: 91
MCV: 91.2
MCV: 92.6
Platelets: 170
Platelets: 208
Platelets: 221
Platelets: 258
Platelets: 292
RBC: 2.62 — ABNORMAL LOW
RBC: 2.93 — ABNORMAL LOW
RBC: 3.11 — ABNORMAL LOW
RBC: 3.19 — ABNORMAL LOW
RBC: 3.2 — ABNORMAL LOW
RBC: 3.36 — ABNORMAL LOW
RBC: 3.48 — ABNORMAL LOW
RBC: 3.5 — ABNORMAL LOW
RBC: 3.88 — ABNORMAL LOW
RDW: 12.7
RDW: 12.9
RDW: 13.4
RDW: 13.4
RDW: 13.8
WBC: 12.6 — ABNORMAL HIGH
WBC: 13 — ABNORMAL HIGH
WBC: 14.2 — ABNORMAL HIGH
WBC: 8.8
WBC: 9.4
WBC: 9.5
WBC: 9.6

## 2011-01-16 LAB — I-STAT 8, (EC8 V) (CONVERTED LAB)
Acid-base deficit: 5 — ABNORMAL HIGH
Chloride: 102
Glucose, Bld: 212 — ABNORMAL HIGH
Hemoglobin: 10.5 — ABNORMAL LOW
Potassium: 3.9
Sodium: 136
pH, Ven: 7.349 — ABNORMAL HIGH

## 2011-01-16 LAB — CARDIAC PANEL(CRET KIN+CKTOT+MB+TROPI)
Total CK: 736 — ABNORMAL HIGH
Troponin I: 18.51

## 2011-01-16 LAB — POCT I-STAT 3, ART BLOOD GAS (G3+)
Acid-base deficit: 2
Acid-base deficit: 6 — ABNORMAL HIGH
Bicarbonate: 19.8 — ABNORMAL LOW
Bicarbonate: 19.9 — ABNORMAL LOW
O2 Saturation: 100
Operator id: 209041
Operator id: 209041
Operator id: 274841
Patient temperature: 35.2
TCO2: 26
pCO2 arterial: 39.3
pH, Arterial: 7.311 — ABNORMAL LOW
pH, Arterial: 7.32 — ABNORMAL LOW
pO2, Arterial: 342 — ABNORMAL HIGH
pO2, Arterial: 92
pO2, Arterial: 93

## 2011-01-16 LAB — TYPE AND SCREEN

## 2011-01-16 LAB — POCT I-STAT 4, (NA,K, GLUC, HGB,HCT)
Glucose, Bld: 147 — ABNORMAL HIGH
Glucose, Bld: 88
HCT: 23 — ABNORMAL LOW
HCT: 23 — ABNORMAL LOW
Hemoglobin: 10.2 — ABNORMAL LOW
Hemoglobin: 6.8 — CL
Hemoglobin: 7.8 — CL
Hemoglobin: 7.8 — CL
Operator id: 3406
Operator id: 3406
Operator id: 3406
Operator id: 3406
Potassium: 3.6
Potassium: 3.8
Potassium: 4.4
Sodium: 134 — ABNORMAL LOW

## 2011-01-16 LAB — BLOOD GAS, ARTERIAL
Bicarbonate: 24.5 — ABNORMAL HIGH
FIO2: 0.21
pH, Arterial: 7.425
pO2, Arterial: 65.5 — ABNORMAL LOW

## 2011-01-16 LAB — CREATININE, SERUM: GFR calc Af Amer: >60

## 2011-01-16 LAB — URINALYSIS, ROUTINE W REFLEX MICROSCOPIC
Bilirubin Urine: NEGATIVE
Glucose, UA: NEGATIVE
Hgb urine dipstick: NEGATIVE
Specific Gravity, Urine: 1.012

## 2011-01-16 LAB — PROTIME-INR
INR: 1.8 — ABNORMAL HIGH
Prothrombin Time: 14.5
Prothrombin Time: 21.7 — ABNORMAL HIGH

## 2011-01-16 LAB — I-STAT EC8
Acid-base deficit: 5 — ABNORMAL HIGH
BUN: 12
Bicarbonate: 20.6
HCT: 25 — ABNORMAL LOW
Operator id: 209041
pCO2 arterial: 38.8

## 2011-01-16 LAB — POCT CARDIAC MARKERS
CKMB, poc: 22.4
Myoglobin, poc: 335
Operator id: 4761
Troponin i, poc: 0.69
Troponin i, poc: 0.7
Troponin i, poc: 1.36

## 2011-01-16 LAB — MAGNESIUM: Magnesium: 2

## 2011-01-16 LAB — CK TOTAL AND CKMB (NOT AT ARMC): Relative Index: 14.1 — ABNORMAL HIGH

## 2011-01-16 LAB — HEMOGLOBIN A1C
Hgb A1c MFr Bld: 6.8 — ABNORMAL HIGH
Hgb A1c MFr Bld: 7 — ABNORMAL HIGH

## 2011-01-16 LAB — TROPONIN I: Troponin I: 5.81

## 2011-01-16 LAB — HEPARIN LEVEL (UNFRACTIONATED): Heparin Unfractionated: 0.61

## 2011-01-16 LAB — APTT: aPTT: 50 — ABNORMAL HIGH

## 2011-01-16 LAB — POCT I-STAT GLUCOSE: Glucose, Bld: 135 — ABNORMAL HIGH

## 2011-01-16 LAB — LIPID PANEL: HDL: 29 — ABNORMAL LOW

## 2011-01-21 ENCOUNTER — Ambulatory Visit: Payer: Medicare Other | Admitting: Cardiology

## 2011-01-23 ENCOUNTER — Other Ambulatory Visit: Payer: Self-pay | Admitting: Cardiovascular Disease

## 2011-01-23 DIAGNOSIS — R55 Syncope and collapse: Secondary | ICD-10-CM

## 2011-01-24 ENCOUNTER — Encounter (INDEPENDENT_AMBULATORY_CARE_PROVIDER_SITE_OTHER): Payer: Medicare Other | Admitting: *Deleted

## 2011-01-24 DIAGNOSIS — I6529 Occlusion and stenosis of unspecified carotid artery: Secondary | ICD-10-CM

## 2011-01-24 DIAGNOSIS — R55 Syncope and collapse: Secondary | ICD-10-CM

## 2011-02-04 ENCOUNTER — Telehealth: Payer: Self-pay | Admitting: *Deleted

## 2011-02-04 NOTE — Telephone Encounter (Signed)
Notified of carotid doppler test. Will repeat in 1 yr.

## 2011-02-10 ENCOUNTER — Ambulatory Visit: Payer: Medicare Other | Admitting: Cardiology

## 2011-02-13 ENCOUNTER — Other Ambulatory Visit: Payer: Self-pay | Admitting: *Deleted

## 2011-02-13 MED ORDER — FUROSEMIDE 40 MG PO TABS: 40.0000 mg | ORAL_TABLET | Freq: Every day | ORAL | Status: DC

## 2011-05-16 ENCOUNTER — Ambulatory Visit (INDEPENDENT_AMBULATORY_CARE_PROVIDER_SITE_OTHER): Payer: Medicare Other | Admitting: Cardiology

## 2011-05-16 ENCOUNTER — Encounter: Payer: Self-pay | Admitting: Cardiology

## 2011-05-16 VITALS — BP 120/70 | HR 61 | Wt 219.0 lb

## 2011-05-16 DIAGNOSIS — I2510 Atherosclerotic heart disease of native coronary artery without angina pectoris: Secondary | ICD-10-CM

## 2011-05-16 DIAGNOSIS — E785 Hyperlipidemia, unspecified: Secondary | ICD-10-CM

## 2011-05-16 DIAGNOSIS — R55 Syncope and collapse: Secondary | ICD-10-CM

## 2011-05-16 DIAGNOSIS — I10 Essential (primary) hypertension: Secondary | ICD-10-CM

## 2011-05-16 DIAGNOSIS — Z951 Presence of aortocoronary bypass graft: Secondary | ICD-10-CM

## 2011-05-16 NOTE — Patient Instructions (Signed)
Continue your current cardiac medications.  I will see you again in 6 months. 

## 2011-05-16 NOTE — Assessment & Plan Note (Signed)
Blood pressure is under good control. I've made no changes in his medical therapy.

## 2011-05-16 NOTE — Assessment & Plan Note (Signed)
He has no significant anginal symptoms. Extensive cardiac evaluation within the past year was unremarkable. We will continue with medical therapy.

## 2011-05-16 NOTE — Progress Notes (Signed)
Terrance Perez Date of Birth: [birth year]   History of Present Illness: Terrance Perez is seen today for followup. He continues to complain of lower abdominal pain that radiates upward to. This is occurring less frequent than before. He describes this as a pressure sensation. Sometimes it is so severe that he gets short of breath. He had an episode this morning. Afterwards he had a large bowel movement and his symptoms resolved. He is using MiraLax as needed. He is having no significant chest pain or shortness of breath otherwise.  Current Outpatient Prescriptions on File Prior to Visit  Medication Sig Dispense Refill  . allopurinol (ZYLOPRIM) 300 MG tablet Take 300 mg by mouth daily.        [name] amLODipine (NORVASC) 5 MG tablet Take 5 mg by mouth 2 (two) times daily.        . benztropine (COGENTIN) 1 MG tablet Take 1 mg by mouth 2 (two) times daily.       . brimonidine (ALPHAGAN P) 0.1 % SOLN Apply to eye 2 (two) times daily.        . clopidogrel (PLAVIX) 75 MG tablet Take 75 mg by mouth daily.        . dorzolamide-timolol (COSOPT) 22.3-6.8 MG/ML ophthalmic solution Place 1 drop into both eyes 2 (two) times daily.        . furosemide (LASIX) 40 MG tablet Take 1 tablet (40 mg total) by mouth daily.  30 tablet  5  . glyBURIDE-metformin (GLUCOVANCE) 5-500 MG per tablet Take 2 tablets by mouth daily with breakfast. 2 TABLET IN THE AM, 1 TABLET IN THE PM      . metoprolol (LOPRESSOR) 50 MG tablet TAKE ONE TABLET BY MOUTH TWICE DAILY  60 tablet  5  . polyethylene glycol (MIRALAX / GLYCOLAX) packet Take 17 g by mouth as needed.        . potassium chloride 20 MEQ/15ML (10%) solution TAKE THREE TEASPOONSFUL BY MOUTH EVERY DAY  500 mL  3  . QUEtiapine (SEROQUEL) 50 MG tablet Take 50 mg by mouth at bedtime. Taking 1 at 6am, 1 and noon, 1 and 3pm, 2 at 7pm.      . simvastatin (ZOCOR) 20 MG tablet Take 1 tablet (20 mg total) by mouth every evening.  30 tablet  11  . travoprost, benzalkonium, (TRAVATAN)  0.004 % ophthalmic solution Place 1 drop into both eyes at bedtime.          No Known Allergies  Past Medical History  Diagnosis Date  . Coronary artery disease   . Diabetes mellitus   . Hypertension   . Hyperlipidemia   . CVD (cerebrovascular disease)   . Gout   . Glaucoma   . Paranoid schizophrenia   . Tremor   . History of orthostatic hypotension   . Dyspnea   . Diastolic dysfunction     Past Surgical History  Procedure Date  . Cardiac catheterization 08/31/2006    MODERATE INFERIOR WALL HYPOKINESIA WITH EF 45%  . Coronary artery bypass graft 2008    LIMA GRAFT TO THE LAD, SAPHENOUS VEIN GRAFT TO THE DIAGONAL , SEQUENTIAL SAPHENOUS VEIN GRAFT TO THE PDA, POSTERIOR LATERAL , AND OBTUSE MARGINAL VESSELS.  . Tonsillectomy   . Asd repair   . Cholecystectomy     History  Smoking status  . Former Smoker  . Quit date: 03/31/1958  Smokeless tobacco  . Not on file    History  Alcohol Use No  History reviewed. No pertinent family history.  Review of Systems: The review of systems is positive for lower extremity weakness.  He has had several falls where his knees just give way. All other systems were reviewed and are negative.  Physical Exam: BP 120/70  Pulse 61  Wt 219 lb (99.338 kg) He is an elderly white male in no acute distress. He wears glasses. His pupils are equal and reactive. Oropharynx is clear. Neck supple no JVD, adenopathy, thyromegaly, or bruits. Lungs are clear. Cardiac exam reveals a regular rate and rhythm without gallop or murmur. Abdomen is obese and nontender. He has 1-2+ ankle edema. His tremor has improved significantly. He is alert and oriented x3. Cranial nerves II through XII are intact. LABORATORY DATA:   Assessment / Plan:

## 2011-05-16 NOTE — Assessment & Plan Note (Signed)
He has had no recurrent syncope. He does have significant lower extremity weakness and has fallen several times because of this.

## 2011-05-24 ENCOUNTER — Other Ambulatory Visit: Payer: Self-pay | Admitting: Cardiology

## 2011-05-26 ENCOUNTER — Other Ambulatory Visit: Payer: Self-pay | Admitting: *Deleted

## 2011-07-02 ENCOUNTER — Other Ambulatory Visit: Payer: Self-pay | Admitting: Cardiology

## 2011-08-13 ENCOUNTER — Other Ambulatory Visit: Payer: Self-pay | Admitting: Cardiology

## 2011-08-24 ENCOUNTER — Encounter (HOSPITAL_COMMUNITY): Payer: Self-pay

## 2011-08-24 ENCOUNTER — Emergency Department (HOSPITAL_COMMUNITY): Payer: Medicare Other

## 2011-08-24 ENCOUNTER — Emergency Department (HOSPITAL_COMMUNITY)
Admission: EM | Admit: 2011-08-24 | Discharge: 2011-08-24 | Disposition: A | Discharge status: Home / Self Care | Payer: Medicare Other | Attending: Emergency Medicine | Admitting: Emergency Medicine

## 2011-08-24 DIAGNOSIS — M79609 Pain in unspecified limb: Secondary | ICD-10-CM | POA: Insufficient documentation

## 2011-08-24 DIAGNOSIS — W010XXA Fall on same level from slipping, tripping and stumbling without subsequent striking against object, initial encounter: Secondary | ICD-10-CM | POA: Insufficient documentation

## 2011-08-24 DIAGNOSIS — I2510 Atherosclerotic heart disease of native coronary artery without angina pectoris: Secondary | ICD-10-CM | POA: Insufficient documentation

## 2011-08-24 DIAGNOSIS — E785 Hyperlipidemia, unspecified: Secondary | ICD-10-CM | POA: Insufficient documentation

## 2011-08-24 DIAGNOSIS — IMO0002 Reserved for concepts with insufficient information to code with codable children: Secondary | ICD-10-CM | POA: Insufficient documentation

## 2011-08-24 DIAGNOSIS — I10 Essential (primary) hypertension: Secondary | ICD-10-CM | POA: Insufficient documentation

## 2011-08-24 DIAGNOSIS — H409 Unspecified glaucoma: Secondary | ICD-10-CM | POA: Insufficient documentation

## 2011-08-24 DIAGNOSIS — S42213A Unspecified displaced fracture of surgical neck of unspecified humerus, initial encounter for closed fracture: Secondary | ICD-10-CM | POA: Insufficient documentation

## 2011-08-24 DIAGNOSIS — E119 Type 2 diabetes mellitus without complications: Secondary | ICD-10-CM | POA: Insufficient documentation

## 2011-08-24 MED ORDER — HYDROMORPHONE HCL PF 1 MG/ML IJ SOLN
1.0000 mg | Freq: Once | INTRAMUSCULAR | Status: AC
  Administered 2011-08-24: 1 mg via INTRAMUSCULAR
  Filled 2011-08-24: qty 1

## 2011-08-24 MED ORDER — OXYCODONE-ACETAMINOPHEN 5-325 MG PO TABS: 1.0000 | ORAL_TABLET | ORAL | Status: AC | PRN

## 2011-08-24 NOTE — ED Notes (Signed)
Ortho Bedside

## 2011-08-24 NOTE — ED Notes (Signed)
Pt presents with no acute dsitress- Witnessed fall tripped with shopping cart- fell onto right side- Denies LOC neck and back pain.  Only c/o rt shoulder and rt upper arm pain only

## 2011-08-24 NOTE — ED Provider Notes (Signed)
History    78 year old male with right shoulder pain. Patient sustained a mechanical fall. Patient was holding on to a shopping cart that his wife was pushing a cart went off the curb and the patient went with it. He fell onto right side. Does not think he hit his head. Denies headache, neck or back pain. No LOC. Denies significant pain anywhere else. No numbness or tingling. On plavix.  CSN: [id number]  Arrival date & time 08/24/11  1803   First MD Initiated Contact with Patient 08/24/11 1855      Chief Complaint  Patient presents with  . Fall  . Arm Pain  . Abrasion    (Consider location/radiation/quality/duration/timing/severity/associated sxs/prior treatment) HPI  Past Medical History  Diagnosis Date  . Coronary artery disease   . Diabetes mellitus   . Hypertension   . Hyperlipidemia   . CVD (cerebrovascular disease)   . Gout   . Glaucoma   . Paranoid schizophrenia   . Tremor   . History of orthostatic hypotension   . Dyspnea   . Diastolic dysfunction     Past Surgical History  Procedure Date  . Cardiac catheterization 08/31/2006    MODERATE INFERIOR WALL HYPOKINESIA WITH EF 45%  . Coronary artery bypass graft 2008    LIMA GRAFT TO THE LAD, SAPHENOUS VEIN GRAFT TO THE DIAGONAL , SEQUENTIAL SAPHENOUS VEIN GRAFT TO THE PDA, POSTERIOR LATERAL , AND OBTUSE MARGINAL VESSELS.  . Tonsillectomy   . Asd repair   . Cholecystectomy     No family history on file.  History  Substance Use Topics  . Smoking status: Former Smoker    Quit date: 03/31/1958  . Smokeless tobacco: Not on file  . Alcohol Use: No      Review of Systems   Review of symptoms negative unless otherwise noted in HPI.   Allergies  Review of patient's allergies indicates no known allergies.  Home Medications   Current Outpatient Rx  Name Route Sig Dispense Refill  . ALLOPURINOL 300 MG PO TABS Oral Take 300 mg by mouth daily.      [name] AMLODIPINE BESYLATE 5 MG PO TABS Oral Take 5 mg by mouth 2  (two) times daily.      [name] BENZTROPINE MESYLATE 1 MG PO TABS Oral Take 1-2 mg by mouth 2 (two) times daily. Take 1 tablet in the morning Take 2 tablets in the afternoon    . BRIMONIDINE TARTRATE 0.1 % OP SOLN Both Eyes Place 1 drop into both eyes 2 (two) times daily.     [name] CLOPIDOGREL BISULFATE 75 MG PO TABS Oral Take 75 mg by mouth daily.      . DORZOLAMIDE HCL-TIMOLOL MAL 22.3-6.8 MG/ML OP SOLN Both Eyes Place 1 drop into both eyes 2 (two) times daily.      . FUROSEMIDE 40 MG PO TABS  TAKE ONE TABLET BY MOUTH EVERY DAY 30 tablet 5  . GLYBURIDE-METFORMIN 5-500 MG PO TABS Oral Take 2 tablets by mouth daily with breakfast. 2 TABLET IN THE AM, 1 TABLET IN THE PM    . LINACLOTIDE 290 MCG PO CAPS Oral Take 1 capsule by mouth daily.    [name] METOPROLOL TARTRATE 50 MG PO TABS  TAKE ONE TABLET BY MOUTH TWICE DAILY 60 tablet 12  . PANTOPRAZOLE SODIUM 40 MG PO TBEC Oral Take 40 mg by mouth daily.    [name] POLYETHYLENE GLYCOL 3350 PO PACK Oral Take 17 g by mouth daily as needed. For constipation    .  POTASSIUM CHLORIDE 20 MEQ/15ML (10%) PO LIQD Oral Take 20 mEq by mouth 3 (three) times daily.    . QUETIAPINE FUMARATE 50 MG PO TABS Oral Take 50 mg by mouth 4 (four) times daily.     [name] SIMVASTATIN 20 MG PO TABS Oral Take 1 tablet (20 mg total) by mouth every evening. 30 tablet 11  . TRAVOPROST 0.004 % OP SOLN Both Eyes Place 1 drop into both eyes at bedtime.      . OXYCODONE-ACETAMINOPHEN 5-325 MG PO TABS Oral Take 1-2 tablets by mouth every 4 (four) hours as needed for pain. 30 tablet 0    BP 130/71  Pulse 76  Temp(Src) 98.5 F (36.9 C) (Oral)  Resp 18  Ht 5\' 7"  (1.702 m)  Wt 214 lb (97.07 kg)  BMI 33.52 kg/m2  SpO2 100%  Physical Exam  Nursing note and vitals reviewed. Constitutional: He appears well-developed and well-nourished. No distress.  HENT:  Head: Normocephalic and atraumatic.  Eyes: Conjunctivae are normal. Right eye exhibits no discharge. Left eye exhibits no discharge.  Neck: Neck supple.    Cardiovascular: Normal rate, regular rhythm and normal heart sounds.  Exam reveals no gallop and no friction rub.   No murmur heard. Pulmonary/Chest: Effort normal and breath sounds normal. No respiratory distress.  Abdominal: Soft. He exhibits no distension. There is no tenderness.  Musculoskeletal: He exhibits no edema and no tenderness.       Deformity R shoulder. Tenderness R shoulder  Neurological: He is alert.  Skin: Skin is warm and dry.       Small abrasion to R knee without underlying bony tenderness or pain with ROM. No effusion.  Psychiatric: He has a normal mood and affect. His behavior is normal. Thought content normal.    ED Course  Procedures (including critical care time)  Labs Reviewed - No data to display Dg Shoulder Right  08/24/2011  *RADIOLOGY REPORT*  Clinical Data: Larey Seat, pain  RIGHT SHOULDER - 2+ VIEW  Comparison:  None.  Findings: There is a slightly comminuted, mildly angulated, and impacted fracture of the surgical neck of the humerus.  No glenohumeral dislocation.  IMPRESSION: As above.  Original Report Authenticated By: Elsie Stain, M.D.   Dg Humerus Right  08/24/2011  *RADIOLOGY REPORT*  Clinical Data: Fall, pain  RIGHT HUMERUS - 2+ VIEW  Comparison: None.  Findings: Slightly comminuted, angulated, and impacted surgical neck humeral fracture. Mild soft tissue swelling.  No visible glenohumeral dislocation.  IMPRESSION: Humeral neck fracture as described.  Original Report Authenticated By: Elsie Stain, M.D.     1. Humeral surgical neck fracture       MDM  78 year old male with right shoulder pain after mechanical fall. NO hx to suggest syncope. X-ray significant for a closed right humeral surgical neck fracture. Neurovascularly intact distally. Plan sling, pain medication and orthopedic followup.        Raeford Razor, MD 08/27/11 1728

## 2011-08-24 NOTE — Discharge Instructions (Signed)
Humerus Fracture, Treated with Immobilization The humerus is the large bone in your upper arm. You have a broken (fractured) humerus. These fractures are easily diagnosed with X-rays. TREATMENT  Simple fractures which will heal without disability are treated with simple immobilization. Immobilization means you will wear a cast, splint, or sling. You have a fracture which will do well with immobilization. The fracture will heal well simply by being held in a good position until it is stable enough to begin range of motion exercises. Do not take part in activities which would further injure your arm.  HOME CARE INSTRUCTIONS   Put ice on the injured area.   Put ice in a plastic bag.   Place a towel between your skin and the bag.   Leave the ice on for 15 to 20 minutes, 3 to 4 times a day.   If you have a cast:   Do not scratch the skin under the cast using sharp or pointed objects.   Check the skin around the cast every day. You may put lotion on any red or sore areas.   Keep your cast dry and clean.   If you have a splint:   Wear the splint as directed.   Keep your splint dry and clean.   You may loosen the elastic around the splint if your fingers become numb, tingle, or turn cold or blue.   If you have a sling:   Wear the sling as directed.   Do not put pressure on any part of your cast or splint until it is fully hardened.   Your cast or splint can be protected during bathing with a plastic bag. Do not lower the cast or splint into water.   Only take over-the-counter or prescription medicines for pain, discomfort, or fever as directed by your caregiver.   Do range of motion exercises as instructed by your caregiver.   Follow up as directed by your caregiver. This is very important in order to avoid permanent injury or disability and chronic pain.  SEEK IMMEDIATE MEDICAL CARE IF:   Your skin or nails in the injured arm turn blue or gray.   Your arm feels cold or numb.     You develop severe pain in the injured arm.   You are having problems with the medicines you were given.  MAKE SURE YOU:   Understand these instructions.   Will watch your condition.   Will get help right away if you are not doing well or get worse.  Document Released: 06/23/2000 Document Revised: 03/06/2011 Document Reviewed: 05/01/2010 [employer] Patient Information 2012 [employer]. 

## 2011-10-01 ENCOUNTER — Other Ambulatory Visit: Payer: Self-pay | Admitting: Cardiology

## 2012-01-02 ENCOUNTER — Ambulatory Visit (INDEPENDENT_AMBULATORY_CARE_PROVIDER_SITE_OTHER): Payer: Medicare Other | Admitting: Cardiology

## 2012-01-02 ENCOUNTER — Encounter: Payer: Self-pay | Admitting: Cardiology

## 2012-01-02 VITALS — BP 110/60 | HR 72 | Ht 67.0 in | Wt 206.0 lb

## 2012-01-02 DIAGNOSIS — I5189 Other ill-defined heart diseases: Secondary | ICD-10-CM

## 2012-01-02 DIAGNOSIS — I519 Heart disease, unspecified: Secondary | ICD-10-CM

## 2012-01-02 DIAGNOSIS — I2510 Atherosclerotic heart disease of native coronary artery without angina pectoris: Secondary | ICD-10-CM

## 2012-01-02 DIAGNOSIS — I10 Essential (primary) hypertension: Secondary | ICD-10-CM

## 2012-01-02 NOTE — Patient Instructions (Signed)
Continue your current therapy  I will see you again in 6 months.   

## 2012-01-02 NOTE — Progress Notes (Signed)
Terrance Perez Date of Birth: 1933-04-09   History of Present Illness: Terrance Perez is seen today for followup. It May he fell and fractured his right humeral neck. This was treated conservatively with a sling. He has done well from a cardiac standpoint without any symptoms of chest pain or shortness of breath. His lower abdominal pain has resolved. He has mild lower extremity edema but finds it very uncomfortable to wear support hose.  Current Outpatient Prescriptions on File Prior to Visit  Medication Sig Dispense Refill  . allopurinol (ZYLOPRIM) 300 MG tablet Take 300 mg by mouth daily.        [name] amLODipine (NORVASC) 5 MG tablet Take 5 mg by mouth 2 (two) times daily.        . benztropine (COGENTIN) 1 MG tablet Take 1-2 mg by mouth 2 (two) times daily. Take 1 tablet in the morning Take 2 tablets in the afternoon      . brimonidine (ALPHAGAN P) 0.1 % SOLN Place 1 drop into both eyes 2 (two) times daily.       . clopidogrel (PLAVIX) 75 MG tablet Take 75 mg by mouth daily.        . dorzolamide-timolol (COSOPT) 22.3-6.8 MG/ML ophthalmic solution Place 1 drop into both eyes 2 (two) times daily.        . furosemide (LASIX) 40 MG tablet TAKE ONE TABLET BY MOUTH EVERY DAY  30 tablet  5  . glyBURIDE-metformin (GLUCOVANCE) 5-500 MG per tablet Take 2 tablets by mouth daily with breakfast. 1 TABLET IN THE AM, 1 TABLET IN THE PM      . Linaclotide 290 MCG CAPS Take 1 capsule by mouth daily.      . metoprolol (LOPRESSOR) 50 MG tablet TAKE ONE TABLET BY MOUTH TWICE DAILY  60 tablet  12  . pantoprazole (PROTONIX) 40 MG tablet Take 40 mg by mouth daily.      . polyethylene glycol (MIRALAX / GLYCOLAX) packet Take 17 g by mouth daily as needed. For constipation      . potassium chloride 20 MEQ/15ML (10%) solution TAKE THREE TEASPOONSFUL BY MOUTH EVERY DAY  473 mL  2  . QUEtiapine (SEROQUEL) 50 MG tablet Take 50 mg by mouth 3 (three) times daily.       . simvastatin (ZOCOR) 20 MG tablet Take 1 tablet (20  mg total) by mouth every evening.  30 tablet  11  . travoprost, benzalkonium, (TRAVATAN) 0.004 % ophthalmic solution Place 1 drop into both eyes at bedtime.          No Known Allergies  Past Medical History  Diagnosis Date  . Coronary artery disease   . Diabetes mellitus   . Hypertension   . Hyperlipidemia   . CVD (cerebrovascular disease)   . Gout   . Glaucoma   . Paranoid schizophrenia   . Tremor   . History of orthostatic hypotension   . Dyspnea   . Diastolic dysfunction   . Fracture, humerus     right    Past Surgical History  Procedure Date  . Cardiac catheterization 08/31/2006    MODERATE INFERIOR WALL HYPOKINESIA WITH EF 45%  . Coronary artery bypass graft 2008    LIMA GRAFT TO THE LAD, SAPHENOUS VEIN GRAFT TO THE DIAGONAL , SEQUENTIAL SAPHENOUS VEIN GRAFT TO THE PDA, POSTERIOR LATERAL , AND OBTUSE MARGINAL VESSELS.  . Tonsillectomy   . Asd repair   . Cholecystectomy     History  Smoking status  .  Former Smoker  . Quit date: 03/31/1958  Smokeless tobacco  . Not on file    History  Alcohol Use No    History reviewed. No pertinent family history.  Review of Systems: The review of systems is positive for lower extremity weakness.  He has had several falls.. All other systems were reviewed and are negative.  Physical Exam: BP 110/60  Pulse 72  Ht 5\' 7"  (1.702 m)  Wt 206 lb (93.441 kg)  BMI 32.26 kg/m2 He is an elderly white male in no acute distress. He is in a wheelchair. He wears glasses. His pupils are equal and reactive. Oropharynx is clear. Neck supple no JVD, adenopathy, thyromegaly, or bruits. Lungs are clear. Cardiac exam reveals a regular rate and rhythm without gallop or murmur. Abdomen is obese and nontender. He has 1+ ankle edema. His tremor has improved significantly. He is alert and oriented x3. Cranial nerves II through XII are intact. LABORATORY DATA: ECG demonstrates normal sinus rhythm with first degree AV block and a left bundle branch  block. This is chronic.  Assessment / Plan: 1. Coronary disease status post CABG in 2008. He has no clinical symptoms. One year ago he had a Myoview study and echocardiogram which were unremarkable. He also wore an event monitor.  2. Hypertension, controlled.  3. Recurrent falls related to leg weakness.

## 2012-01-28 ENCOUNTER — Other Ambulatory Visit: Payer: Self-pay | Admitting: Cardiology

## 2012-01-30 ENCOUNTER — Encounter (HOSPITAL_BASED_OUTPATIENT_CLINIC_OR_DEPARTMENT_OTHER): Payer: Medicare Other | Attending: General Surgery

## 2012-01-30 DIAGNOSIS — L8991 Pressure ulcer of unspecified site, stage 1: Secondary | ICD-10-CM | POA: Insufficient documentation

## 2012-01-30 DIAGNOSIS — L89309 Pressure ulcer of unspecified buttock, unspecified stage: Secondary | ICD-10-CM | POA: Insufficient documentation

## 2012-01-30 DIAGNOSIS — I519 Heart disease, unspecified: Secondary | ICD-10-CM | POA: Insufficient documentation

## 2012-01-30 DIAGNOSIS — E785 Hyperlipidemia, unspecified: Secondary | ICD-10-CM | POA: Insufficient documentation

## 2012-01-30 DIAGNOSIS — M109 Gout, unspecified: Secondary | ICD-10-CM | POA: Insufficient documentation

## 2012-01-30 DIAGNOSIS — H409 Unspecified glaucoma: Secondary | ICD-10-CM | POA: Insufficient documentation

## 2012-01-30 DIAGNOSIS — F209 Schizophrenia, unspecified: Secondary | ICD-10-CM | POA: Insufficient documentation

## 2012-01-30 DIAGNOSIS — I10 Essential (primary) hypertension: Secondary | ICD-10-CM | POA: Insufficient documentation

## 2012-01-30 DIAGNOSIS — Z8673 Personal history of transient ischemic attack (TIA), and cerebral infarction without residual deficits: Secondary | ICD-10-CM | POA: Insufficient documentation

## 2012-01-30 DIAGNOSIS — Z79899 Other long term (current) drug therapy: Secondary | ICD-10-CM | POA: Insufficient documentation

## 2012-01-30 DIAGNOSIS — Z951 Presence of aortocoronary bypass graft: Secondary | ICD-10-CM | POA: Insufficient documentation

## 2012-01-30 DIAGNOSIS — I2510 Atherosclerotic heart disease of native coronary artery without angina pectoris: Secondary | ICD-10-CM | POA: Insufficient documentation

## 2012-01-30 NOTE — Progress Notes (Signed)
[facility]  NAME:  Terrance Perez, Terrance Perez NO.:  [id number]  MEDICAL RECORD NO.:  [id number]      DATE OF BIRTH:  02/18/1934  PHYSICIAN:  Ardath Sax, M.D.           VISIT DATE:                                  OFFICE VISIT   This is a man who was sent here because of bed sores on his buttocks. He has a history of many medical problems including coronary artery disease and he has had a coronary bypass graft.  He also has type 2 diabetes, hypertension, hyperlipidemia, cerebrovascular disease, gout, glaucoma, paranoid, schizophrenia, intention tremor of his hands, orthostatic hypotension, diastolic dysfunction, and a fairly recent fracture of his right humerus.  He today was found to have a blood pressure of 110/60, pulse is 70, his weight is 206 pounds.  He is on many medicines including allopurinol, amlodipine which is Norvasc, benztropine, Plavix, timolol drops, Lasix 40 mg a day, glyburide 5/500, metoprolol which is Lopressor 50 mg twice a day, Protonix, MiraLax and potassium chloride.  He was alert and answered questions.  I did look at his wounds on his buttocks and they were very superficial pressure sores on his buttocks.  I would call them as stage I.  The right side is a little larger than the left.  We are just treating it with off-loading him.  He does wear a diaper when he is incontinent and I talked to the daughter and they are going to just off-load him and if they need, they have got some DuoDerm to use.     Ardath Sax, M.D.     PP/MEDQ  D:  01/30/2012  T:  01/30/2012  Job:  [id number]

## 2012-02-02 ENCOUNTER — Other Ambulatory Visit: Payer: Self-pay | Admitting: Cardiology

## 2012-02-02 DIAGNOSIS — I6529 Occlusion and stenosis of unspecified carotid artery: Secondary | ICD-10-CM

## 2012-02-06 ENCOUNTER — Other Ambulatory Visit: Payer: Self-pay | Admitting: Cardiology

## 2012-02-06 MED ORDER — FUROSEMIDE 40 MG PO TABS: 40.0000 mg | ORAL_TABLET | Freq: Every day | ORAL | Status: DC

## 2012-02-13 ENCOUNTER — Encounter (INDEPENDENT_AMBULATORY_CARE_PROVIDER_SITE_OTHER): Payer: Medicare Other

## 2012-02-13 DIAGNOSIS — I6529 Occlusion and stenosis of unspecified carotid artery: Secondary | ICD-10-CM

## 2012-02-20 ENCOUNTER — Other Ambulatory Visit: Payer: Self-pay | Admitting: *Deleted

## 2012-02-20 MED ORDER — POTASSIUM CHLORIDE 20 MEQ/15ML (10%) PO LIQD: 20.0000 meq | Freq: Three times a day (TID) | ORAL | Status: DC

## 2012-02-20 NOTE — Telephone Encounter (Signed)
Fax Received. Refill Completed. Terrance Perez (R.M.A)   

## 2012-03-05 ENCOUNTER — Encounter (HOSPITAL_BASED_OUTPATIENT_CLINIC_OR_DEPARTMENT_OTHER): Payer: Medicare Other

## 2012-07-02 ENCOUNTER — Encounter: Payer: Self-pay | Admitting: Cardiology

## 2012-07-02 ENCOUNTER — Ambulatory Visit (INDEPENDENT_AMBULATORY_CARE_PROVIDER_SITE_OTHER): Payer: Medicare Other | Admitting: Cardiology

## 2012-07-02 VITALS — BP 122/70 | HR 69 | Ht 67.0 in | Wt 216.8 lb

## 2012-07-02 DIAGNOSIS — I10 Essential (primary) hypertension: Secondary | ICD-10-CM

## 2012-07-02 DIAGNOSIS — I2510 Atherosclerotic heart disease of native coronary artery without angina pectoris: Secondary | ICD-10-CM

## 2012-07-02 DIAGNOSIS — E785 Hyperlipidemia, unspecified: Secondary | ICD-10-CM

## 2012-07-02 DIAGNOSIS — E119 Type 2 diabetes mellitus without complications: Secondary | ICD-10-CM

## 2012-07-02 DIAGNOSIS — I5189 Other ill-defined heart diseases: Secondary | ICD-10-CM

## 2012-07-02 DIAGNOSIS — I519 Heart disease, unspecified: Secondary | ICD-10-CM

## 2012-07-02 NOTE — Patient Instructions (Signed)
Continue your current therapy.  Restrict your salt intake. Keep your feet elevated.  I will see you in 6 months.

## 2012-07-02 NOTE — Progress Notes (Signed)
Terrance Perez Date of Birth: 1934-01-20   History of Present Illness: Terrance Perez is seen today for followup. He reports that he has been experiencing some left parasternal chest pain. He has a lot of gas. He has been taking Gas-X with some improvement. He also states this pain is worse when he coughs. He had an ECG 2 weeks ago that apparently was normal. He has gained weight and does have some increase in edema. He is still unsteady on his feet and apparently has lost peripheral vision due to  glaucoma. Otherwise he feels he is doing well.  Current Outpatient Prescriptions on File Prior to Visit  Medication Sig Dispense Refill  . allopurinol (ZYLOPRIM) 300 MG tablet Take 300 mg by mouth daily.        [name] amLODipine (NORVASC) 5 MG tablet Take 5 mg by mouth 2 (two) times daily.        . benztropine (COGENTIN) 1 MG tablet Take 1-2 mg by mouth 2 (two) times daily. Take 1 tablet in the morning Take 2 tablets in the afternoon      . brimonidine (ALPHAGAN P) 0.1 % SOLN Place 1 drop into both eyes 2 (two) times daily.       . clopidogrel (PLAVIX) 75 MG tablet Take 75 mg by mouth daily.        . dorzolamide-timolol (COSOPT) 22.3-6.8 MG/ML ophthalmic solution Place 1 drop into both eyes 2 (two) times daily.        . furosemide (LASIX) 40 MG tablet Take 1 tablet (40 mg total) by mouth daily.  30 tablet  6  . metoprolol (LOPRESSOR) 50 MG tablet TAKE ONE TABLET BY MOUTH TWICE DAILY  60 tablet  12  . pantoprazole (PROTONIX) 40 MG tablet Take 40 mg by mouth daily.      . polyethylene glycol (MIRALAX / GLYCOLAX) packet Take 17 g by mouth daily as needed. For constipation      . potassium chloride 20 MEQ/15ML (10%) solution Take 15 mLs (20 mEq total) by mouth 3 (three) times daily.  473 mL  6  . QUEtiapine (SEROQUEL) 50 MG tablet Take 50 mg by mouth 4 (four) times daily.       . simvastatin (ZOCOR) 20 MG tablet TAKE ONE TABLET BY MOUTH IN THE EVENING  30 tablet  10  . travoprost, benzalkonium,  (TRAVATAN) 0.004 % ophthalmic solution Place 1 drop into both eyes at bedtime.         No current facility-administered medications on file prior to visit.    No Known Allergies  Past Medical History  Diagnosis Date  . Coronary artery disease   . Diabetes mellitus   . Hypertension   . Hyperlipidemia   . CVD (cerebrovascular disease)   . Gout   . Glaucoma(365)   . Paranoid schizophrenia   . Tremor   . History of orthostatic hypotension   . Dyspnea   . Diastolic dysfunction   . Fracture, humerus     right    Past Surgical History  Procedure Laterality Date  . Cardiac catheterization  08/31/2006    MODERATE INFERIOR WALL HYPOKINESIA WITH EF 45%  . Coronary artery bypass graft  2008    LIMA GRAFT TO THE LAD, SAPHENOUS VEIN GRAFT TO THE DIAGONAL , SEQUENTIAL SAPHENOUS VEIN GRAFT TO THE PDA, POSTERIOR LATERAL , AND OBTUSE MARGINAL VESSELS.  . Tonsillectomy    . Asd repair    . Cholecystectomy      History  Smoking status  . Former Smoker  . Quit date: 03/31/1958  Smokeless tobacco  . Not on file    History  Alcohol Use No    History reviewed. No pertinent family history.  Review of Systems: The review of systems is positive for chronic weakness.  He has had several falls in the past but none recently. All other systems were reviewed and are negative.  Physical Exam: BP 122/70  Pulse 69  Ht 5\' 7"  (1.702 m)  Wt 216 lb 12.8 oz (98.34 kg)  BMI 33.95 kg/m2  SpO2 96% He is an elderly white male in no acute distress. He is in a wheelchair. He wears glasses. His pupils are equal and reactive. Oropharynx is clear. Neck supple no JVD, adenopathy, thyromegaly, or bruits. Lungs are clear. Cardiac exam reveals a regular rate and rhythm without gallop or murmur. Abdomen is obese and nontender. He has 2+ ankle edema. His tremor has improved significantly. He is alert and oriented x3. Cranial nerves II through XII are intact. LABORATORY DATA:   Assessment / Plan: 1. Coronary  disease status post CABG in 2008. He has some recent atypical chest pain that sound more related to gas. One year ago he had a Myoview study in October 2012 showed mild apical ischemia. This is consistent with known severe distal LAD disease. Echocardiogram was also unremarkable. I recommend continuing on his current medical therapy.  2. Hypertension, controlled.  3. Edema. I recommended sodium restriction. He may double up on his Lasix as needed to keep the swelling down. I recommended support hose but he states he cannot wear them because of his claustrophobia. He is going to try and keep his feet elevated more.  4. Diabetes mellitus  3. Recurrent falls related to leg weakness.

## 2012-07-28 ENCOUNTER — Other Ambulatory Visit: Payer: Self-pay | Admitting: Cardiology

## 2012-07-29 NOTE — Telephone Encounter (Signed)
Patient Instructions    Continue your current therapy.   Restrict your salt intake. Keep your feet elevated.   I will see you in 6 months.             Chart Reviewed By    Charna Elizabeth, LPN  on 04/05/1094  2:30 PM        Previous Visit      Provider Department Encounter #    03/05/2012  8:15 AM Peter [place], MD [facility] [id number]

## 2012-08-26 ENCOUNTER — Telehealth: Payer: Self-pay | Admitting: Cardiology

## 2012-08-26 NOTE — Telephone Encounter (Signed)
No need to adjust metoprolol while on Zoloft.  Cheyanna Strick [place] MD, [facility]

## 2012-08-26 NOTE — Telephone Encounter (Signed)
New Prob       Pt was prescribed ZOLOFT and was told it may react with METOPROLOL. Would like to speak to nurse.

## 2012-08-26 NOTE — Telephone Encounter (Signed)
Returned call to patient's wife she stated husband was prescribed zoloft 25 mg daily.Stated was told metoprolol might need to be increased.Message sent to Dr.Jordan for advice.

## 2012-08-27 NOTE — Telephone Encounter (Signed)
Returned call to patient's wife no answer.Left message on personal voice mail Dr.Jordan advised no need to adjust metoprolol while on zoloft.

## 2012-09-08 ENCOUNTER — Other Ambulatory Visit: Payer: Self-pay | Admitting: Cardiology

## 2012-10-05 ENCOUNTER — Other Ambulatory Visit: Payer: Self-pay

## 2012-10-05 MED ORDER — POTASSIUM CHLORIDE 20 MEQ/15ML (10%) PO LIQD: 20.0000 meq | Freq: Three times a day (TID) | ORAL | Status: DC

## 2012-10-05 NOTE — Telephone Encounter (Signed)
potassium chloride 20 MEQ/15ML (10%) solution  Take 15 mLs (20 mEq total) by mouth 3 (three) times daily.   473 mL   6  Continue your current therapy. Restrict your salt intake. Keep your feet elevated. I will see you in 6 months. Chart Reviewed By  Charna Elizabeth, LPN  on 04/05/1094  2:30 PM

## 2012-10-08 ENCOUNTER — Other Ambulatory Visit: Payer: Self-pay | Admitting: *Deleted

## 2012-10-08 MED ORDER — POTASSIUM CHLORIDE 20 MEQ/15ML (10%) PO LIQD: 20.0000 meq | Freq: Three times a day (TID) | ORAL | Status: DC

## 2012-12-02 ENCOUNTER — Encounter: Payer: Self-pay | Admitting: Neurology

## 2012-12-02 ENCOUNTER — Ambulatory Visit (INDEPENDENT_AMBULATORY_CARE_PROVIDER_SITE_OTHER): Payer: Medicare Other | Admitting: Neurology

## 2012-12-02 VITALS — BP 106/62 | HR 70

## 2012-12-02 DIAGNOSIS — G20 Parkinson's disease: Secondary | ICD-10-CM

## 2012-12-02 DIAGNOSIS — R269 Unspecified abnormalities of gait and mobility: Secondary | ICD-10-CM

## 2012-12-02 DIAGNOSIS — G20C Parkinsonism, unspecified: Secondary | ICD-10-CM

## 2012-12-02 DIAGNOSIS — G20A1 Parkinson's disease without dyskinesia, without mention of fluctuations: Secondary | ICD-10-CM

## 2012-12-02 HISTORY — DX: Parkinson's disease: G20

## 2012-12-02 HISTORY — DX: Unspecified abnormalities of gait and mobility: R26.9

## 2012-12-02 HISTORY — DX: Parkinsonism, unspecified: G20.C

## 2012-12-02 NOTE — Progress Notes (Signed)
Reason for visit: Parkinsonism  Terrance Perez is an 79 y.o. male  History of present illness:  Terrance Perez is a 79 year old left-handed white male with a history of schizophrenia. The patient had been treated for years on Stelazine, and he developed Parkinson's type symptoms. The patient has left-sided resting tremors primarily, and a tendency to lean to the left. The patient has had a gait disorder, and he has fallen on occasion. The last fall was about 2 weeks ago. The patient uses a walker to ambulate at home, but he ambulates only short distances. The patient requires assistance with bathing and dressing, but he is able to feed himself. The patient has had issues with hallucinations, but this has been better recently. The patient generally is supervised throughout the day with exception of one or 2 hours a day. The patient sleeps with fairly well at night. The patient has significant peripheral edema that does not completely resolve at nighttime. The patient is incontinent, and he wears adult diapers. The patient returns for an evaluation. In the past, Sinemet has not been initiated for fear of increasing confusion and hallucinations. The patient is on Cogentin for the tremors, but this may impair memory.  Past Medical History  Diagnosis Date  . Coronary artery disease   . Diabetes mellitus   . Hypertension   . Hyperlipidemia   . CVD (cerebrovascular disease)   . Gout   . Glaucoma   . Paranoid schizophrenia   . Tremor   . History of orthostatic hypotension   . Dyspnea   . Diastolic dysfunction   . Fracture, humerus     right  . Parkinsonism 12/02/2012  . Abnormality of gait 12/02/2012  . Obesity     Past Surgical History  Procedure Laterality Date  . Cardiac catheterization  08/31/2006    MODERATE INFERIOR WALL HYPOKINESIA WITH EF 45%  . Coronary artery bypass graft  2008    LIMA GRAFT TO THE LAD, SAPHENOUS VEIN GRAFT TO THE DIAGONAL , SEQUENTIAL SAPHENOUS VEIN GRAFT TO THE  PDA, POSTERIOR LATERAL , AND OBTUSE MARGINAL VESSELS.  . Tonsillectomy    . Asd repair    . Cholecystectomy      Family History  Problem Relation Age of Onset  . Diabetes Father   . Cystic fibrosis Father   . Heart disease Father     Social history:  reports that he quit smoking about 54 years ago. He does not have any smokeless tobacco history on file. He reports that he does not drink alcohol or use illicit drugs.   No Known Allergies  Medications:  Current Outpatient Prescriptions on File Prior to Visit  Medication Sig Dispense Refill  . allopurinol (ZYLOPRIM) 300 MG tablet Take 300 mg by mouth daily.        . benztropine (COGENTIN) 1 MG tablet Take 1 mg by mouth 2 (two) times daily. Take 1 tablet in the morning Take 2 tablets in the afternoon      . brimonidine (ALPHAGAN P) 0.1 % SOLN Place 1 drop into both eyes 2 (two) times daily.       . clopidogrel (PLAVIX) 75 MG tablet Take 75 mg by mouth daily.        . clotrimazole-betamethasone (LOTRISONE) cream       . dorzolamide-timolol (COSOPT) 22.3-6.8 MG/ML ophthalmic solution Place 1 drop into both eyes 2 (two) times daily.        . furosemide (LASIX) 40 MG tablet TAKE ONE TABLET BY  MOUTH EVERY DAY  30 tablet  5  . glipiZIDE-metformin (METAGLIP) 5-500 MG per tablet Take 1 tablet by mouth 2 (two) times daily before a meal.       . metoprolol (LOPRESSOR) 50 MG tablet TAKE ONE TABLET BY MOUTH TWICE DAILY  60 tablet  11  . pantoprazole (PROTONIX) 40 MG tablet Take 40 mg by mouth daily.      . polyethylene glycol (MIRALAX / GLYCOLAX) packet Take 17 g by mouth daily as needed. For constipation      . potassium chloride 20 MEQ/15ML (10%) solution Take 15 mLs (20 mEq total) by mouth 3 (three) times daily.  473 mL  6  . QUEtiapine (SEROQUEL) 50 MG tablet Take 50 mg by mouth 3 (three) times daily.       . simvastatin (ZOCOR) 20 MG tablet TAKE ONE TABLET BY MOUTH IN THE EVENING  30 tablet  10  . travoprost, benzalkonium, (TRAVATAN) 0.004 %  ophthalmic solution Place 1 drop into both eyes at bedtime.         No current facility-administered medications on file prior to visit.    ROS:  Out of a complete 14 system review of symptoms, the patient complains only of the following symptoms, and all other reviewed systems are negative.  Weight loss Swelling in the legs Blurred vision, loss of vision Constipation Urinary incontinence, impotence Feeling cold Joint pain, joint swelling Memory loss, confusion, weakness, tremor Depression, too much sleep, hallucinations, restless legs  Blood pressure 106/62, pulse 70, weight 0 lb (0 kg).  Physical Exam  General: The patient is alert and cooperative at the time of the examination. The patient is moderately obese.  Skin: 3+ edema below the knees is noted bilaterally.   Neurologic Exam  Mental status: The mental status examination done today shows a total score of 19/30.  Cranial nerves: Facial symmetry is present. Speech is normal, no aphasia or dysarthria is noted. Extraocular movements are full. Visual fields are full. Masking of the face is seen.  Motor: The patient has good strength in all 4 extremities.  Coordination: The patient has good finger-nose-finger and heel-to-shin bilaterally.  Gait and station: The patient requires some assistance with standing. Once up, he is able to ambulate short distances with assistance. The patient tends to lean to the left. The patient has shuffling with turns, some freezing. Romberg is negative. Tandem gait was not attempted.  Reflexes: Deep tendon reflexes are symmetric, but are depressed.   Assessment/Plan:  1. Schizophrenia  2. Parkinsonism  3. Gait disorder  4. Peripheral edema  The patient at this point will be watched conservatively. We may consider very low-dose Sinemet in the near future. The patient is having problems with turns, with freezing. The patient tends to lean to the left, further impairing his balance.  The patient will followup in about 5 months.  Marlan Palau MD 12/02/2012 6:39 PM  [facility] [address] [place], [place] [address]  Phone [phone] Fax [phone]

## 2012-12-30 ENCOUNTER — Other Ambulatory Visit: Payer: Self-pay

## 2012-12-30 MED ORDER — SIMVASTATIN 20 MG PO TABS: ORAL_TABLET | ORAL | Status: DC

## 2013-01-27 ENCOUNTER — Other Ambulatory Visit: Payer: Self-pay

## 2013-01-27 MED ORDER — SIMVASTATIN 20 MG PO TABS: ORAL_TABLET | ORAL | Status: DC

## 2013-02-10 ENCOUNTER — Other Ambulatory Visit: Payer: Self-pay

## 2013-02-10 MED ORDER — SIMVASTATIN 20 MG PO TABS: ORAL_TABLET | ORAL | Status: DC

## 2013-02-11 ENCOUNTER — Encounter: Payer: Self-pay | Admitting: Cardiology

## 2013-02-11 ENCOUNTER — Ambulatory Visit (INDEPENDENT_AMBULATORY_CARE_PROVIDER_SITE_OTHER): Payer: Medicare Other | Admitting: Cardiology

## 2013-02-11 VITALS — BP 110/60 | HR 72 | Ht 67.0 in | Wt 218.1 lb

## 2013-02-11 DIAGNOSIS — E785 Hyperlipidemia, unspecified: Secondary | ICD-10-CM

## 2013-02-11 DIAGNOSIS — I2510 Atherosclerotic heart disease of native coronary artery without angina pectoris: Secondary | ICD-10-CM

## 2013-02-11 DIAGNOSIS — I10 Essential (primary) hypertension: Secondary | ICD-10-CM

## 2013-02-11 NOTE — Patient Instructions (Signed)
Continue your current medication  I will see you in 6 months.   

## 2013-02-11 NOTE — Progress Notes (Signed)
Terrance Perez Date of Birth: 1933/05/17   History of Present Illness: Terrance Perez is seen today for followup. He has a history of coronary disease with coronary bypass surgery in 2000 he. He had a Myoview study in October 2012 showed mild apical ischemia. This is consistent with known severe distal LAD disease. Prior echocardiogram was unremarkable. On followup today he complains predominantly of a cramp or tightness in his right leg that shoots up his leg into his abdomen and chest and is associated with shortness of breath. He does have chronic lower extremity edema. He has a significant tremor. He denies any significant chest pain currently. He does have chronic constipation and history of bowel obstruction in the past. He is taking MiraLax daily and his bowels moving regularly. He  Current Outpatient Prescriptions on File Prior to Visit  Medication Sig Dispense Refill  . ACCU-CHEK COMPACT PLUS test strip 100 each by Other route 2 (two) times daily at 8 am and 10 pm.      . allopurinol (ZYLOPRIM) 300 MG tablet Take 300 mg by mouth daily.        [name] amLODipine (NORVASC) 5 MG tablet Take 5 mg by mouth daily.      . benztropine (COGENTIN) 1 MG tablet Take 1 mg by mouth 2 (two) times daily. Take 1 tablet in the morning Take 2 tablets in the afternoon      . brimonidine (ALPHAGAN P) 0.1 % SOLN Place 1 drop into both eyes 2 (two) times daily.       . clopidogrel (PLAVIX) 75 MG tablet Take 75 mg by mouth daily.        . clotrimazole-betamethasone (LOTRISONE) cream       . dorzolamide-timolol (COSOPT) 22.3-6.8 MG/ML ophthalmic solution Place 1 drop into both eyes 2 (two) times daily.        . furosemide (LASIX) 40 MG tablet TAKE ONE TABLET BY MOUTH EVERY DAY  30 tablet  5  . glipiZIDE-metformin (METAGLIP) 5-500 MG per tablet Take 1 tablet by mouth 2 (two) times daily before a meal.       . LINZESS 145 MCG CAPS capsule Take 145 mcg by mouth daily.      . metoprolol (LOPRESSOR) 50 MG tablet TAKE  ONE TABLET BY MOUTH TWICE DAILY  60 tablet  11  . pantoprazole (PROTONIX) 40 MG tablet Take 40 mg by mouth daily.      . polyethylene glycol (MIRALAX / GLYCOLAX) packet Take 17 g by mouth daily as needed. For constipation      . potassium chloride 20 MEQ/15ML (10%) solution Take 15 mLs (20 mEq total) by mouth 3 (three) times daily.  473 mL  6  . QUEtiapine (SEROQUEL) 50 MG tablet Take 50 mg by mouth 3 (three) times daily.       . sertraline (ZOLOFT) 25 MG tablet Take 25 mg by mouth daily.      . simvastatin (ZOCOR) 20 MG tablet TAKE ONE TABLET BY MOUTH IN THE EVENING  90 tablet  0  . travoprost, benzalkonium, (TRAVATAN) 0.004 % ophthalmic solution Place 1 drop into both eyes at bedtime.         No current facility-administered medications on file prior to visit.    No Known Allergies  Past Medical History  Diagnosis Date  . Coronary artery disease   . Diabetes mellitus   . Hypertension   . Hyperlipidemia   . CVD (cerebrovascular disease)   . Gout   .  Glaucoma   . Paranoid schizophrenia   . Tremor   . History of orthostatic hypotension   . Dyspnea   . Diastolic dysfunction   . Fracture, humerus     right  . Parkinsonism 12/02/2012  . Abnormality of gait 12/02/2012  . Obesity     Past Surgical History  Procedure Laterality Date  . Cardiac catheterization  08/31/2006    MODERATE INFERIOR WALL HYPOKINESIA WITH EF 45%  . Coronary artery bypass graft  2008    LIMA GRAFT TO THE LAD, SAPHENOUS VEIN GRAFT TO THE DIAGONAL , SEQUENTIAL SAPHENOUS VEIN GRAFT TO THE PDA, POSTERIOR LATERAL , AND OBTUSE MARGINAL VESSELS.  . Tonsillectomy    . Asd repair    . Cholecystectomy      History  Smoking status  . Former Smoker  . Quit date: 03/31/1958  Smokeless tobacco  . Not on file    Comment: quit 1960's    History  Alcohol Use No    Family History  Problem Relation Age of Onset  . Diabetes Father   . Cystic fibrosis Father   . Heart disease Father     Review of Systems: The  review of systems is positive for chronic weakness.  He has had several falls in the past but none recently. All other systems were reviewed and are negative.  Physical Exam: BP 110/60  Pulse 72  Ht 5\' 7"  (1.702 m)  Wt 218 lb 1.9 oz (98.939 kg)  BMI 34.15 kg/m2 He is an elderly white male in no acute distress. He is in a wheelchair. He wears glasses. His pupils are equal and reactive. Oropharynx is clear. Neck supple no JVD, adenopathy, thyromegaly, or bruits. Lungs are clear. Cardiac exam reveals a regular rate and rhythm without gallop or murmur. Abdomen is obese and nontender. He has 2+ pretibial edema. He has a significant tremor in his hands he. He is alert and oriented x3. Cranial nerves II through XII are intact. LABORATORY DATA:   Assessment / Plan: 1. Coronary disease status post CABG in 2008. Myoview study in October 2012 showed mild apical ischemia. This is consistent with known severe distal LAD disease. Echocardiogram was also unremarkable. I recommend continuing on his current medical therapy.  2. Hypertension, controlled.  3. Edema. I recommended sodium restriction. Continue his current Lasix dose of 40 mg alternating with 80 mg daily. His wife has been instructed to use Ace wraps during the day and I think this is appropriate.  4. Diabetes mellitus  3. Recurrent falls related to leg weakness.

## 2013-02-22 ENCOUNTER — Other Ambulatory Visit: Payer: Self-pay

## 2013-02-22 MED ORDER — SIMVASTATIN 20 MG PO TABS: ORAL_TABLET | ORAL | Status: DC

## 2013-05-04 ENCOUNTER — Other Ambulatory Visit: Payer: Self-pay | Admitting: Cardiology

## 2013-05-11 ENCOUNTER — Encounter: Payer: Self-pay | Admitting: Cardiology

## 2013-05-11 ENCOUNTER — Ambulatory Visit (INDEPENDENT_AMBULATORY_CARE_PROVIDER_SITE_OTHER): Payer: Medicare Other | Admitting: Cardiology

## 2013-05-11 VITALS — BP 113/62 | HR 70 | Ht 67.0 in | Wt 215.0 lb

## 2013-05-11 DIAGNOSIS — I10 Essential (primary) hypertension: Secondary | ICD-10-CM

## 2013-05-11 DIAGNOSIS — I5033 Acute on chronic diastolic (congestive) heart failure: Secondary | ICD-10-CM

## 2013-05-11 DIAGNOSIS — I2510 Atherosclerotic heart disease of native coronary artery without angina pectoris: Secondary | ICD-10-CM

## 2013-05-11 DIAGNOSIS — E785 Hyperlipidemia, unspecified: Secondary | ICD-10-CM

## 2013-05-11 DIAGNOSIS — I5032 Chronic diastolic (congestive) heart failure: Secondary | ICD-10-CM | POA: Insufficient documentation

## 2013-05-11 DIAGNOSIS — I509 Heart failure, unspecified: Secondary | ICD-10-CM

## 2013-05-11 NOTE — Patient Instructions (Signed)
Continue your current therapy  We will schedule you for an Echocardiogram  Restrict your sodium intake to less than 2 grams a day.  2 Gram Low Sodium Diet A 2 gram sodium diet restricts the amount of sodium in the diet to no more than 2 g or 2000 mg daily. Limiting the amount of sodium is often used to help lower blood pressure. It is important if you have heart, liver, or kidney problems. Many foods contain sodium for flavor and sometimes as a preservative. When the amount of sodium in a diet needs to be low, it is important to know what to look for when choosing foods and drinks. The following includes some information and guidelines to help make it easier for you to adapt to a low sodium diet. QUICK TIPS  Do not add salt to food.  Avoid convenience items and fast food.  Choose unsalted snack foods.  Buy lower sodium products, often labeled as "lower sodium" or "no salt added."  Check food labels to learn how much sodium is in 1 serving.  When eating at a restaurant, ask that your food be prepared with less salt or none, if possible. READING FOOD LABELS FOR SODIUM INFORMATION The nutrition facts label is a good place to find how much sodium is in foods. Look for products with no more than 500 to 600 mg of sodium per meal and no more than 150 mg per serving. Remember that 2 g = 2000 mg. The food label may also list foods as:  Sodium-free: Less than 5 mg in a serving.  Very low sodium: 35 mg or less in a serving.  Low-sodium: 140 mg or less in a serving.  Light in sodium: 50% less sodium in a serving. For example, if a food that usually has 300 mg of sodium is changed to become light in sodium, it will have 150 mg of sodium.  Reduced sodium: 25% less sodium in a serving. For example, if a food that usually has 400 mg of sodium is changed to reduced sodium, it will have 300 mg of sodium. CHOOSING FOODS Grains  Avoid: Salted crackers and snack items. Some cereals, including  instant hot cereals. Bread stuffing and biscuit mixes. Seasoned rice or pasta mixes.  Choose: Unsalted snack items. Low-sodium cereals, oats, puffed wheat and rice, shredded wheat. English muffins and bread. Pasta. Meats  Avoid: Salted, canned, smoked, spiced, pickled meats, including fish and poultry. Bacon, ham, sausage, cold cuts, hot dogs, anchovies.  Choose: Low-sodium canned tuna and salmon. Fresh or frozen meat, poultry, and fish. Dairy  Avoid: Processed cheese and spreads. Cottage cheese. Buttermilk and condensed milk. Regular cheese.  Choose: Milk. Low-sodium cottage cheese. Yogurt. Sour cream. Low-sodium cheese. Fruits and Vegetables  Avoid: Regular canned vegetables. Regular canned tomato sauce and paste. Frozen vegetables in sauces. Olives. Rosita Fire. Relishes. Sauerkraut.  Choose: Low-sodium canned vegetables. Low-sodium tomato sauce and paste. Frozen or fresh vegetables. Fresh and frozen fruit. Condiments  Avoid: Canned and packaged gravies. Worcestershire sauce. Tartar sauce. Barbecue sauce. Soy sauce. Steak sauce. Ketchup. Onion, garlic, and table salt. Meat flavorings and tenderizers.  Choose: Fresh and dried herbs and spices. Low-sodium varieties of mustard and ketchup. Lemon juice. Tabasco sauce. Horseradish. SAMPLE 2 GRAM SODIUM MEAL PLAN Breakfast / Sodium (mg)  1 cup low-fat milk / 143 mg  2 slices whole-wheat toast / 270 mg  1 tbs heart-healthy margarine / 153 mg  1 hard-boiled egg / 139 mg  1 small orange / 0  mg Lunch / Sodium (mg)  1 cup raw carrots / 76 mg   cup hummus / 298 mg  1 cup low-fat milk / 143 mg   cup red grapes / 2 mg  1 whole-wheat pita bread / 356 mg Dinner / Sodium (mg)  1 cup whole-wheat pasta / 2 mg  1 cup low-sodium tomato sauce / 73 mg  3 oz lean ground beef / 57 mg  1 small side salad (1 cup raw spinach leaves,  cup cucumber,  cup yellow bell pepper) with 1 tsp olive oil and 1 tsp red wine vinegar / 25 mg Snack /  Sodium (mg)  1 container low-fat vanilla yogurt / 107 mg  3 graham cracker squares / 127 mg Nutrient Analysis  Calories: 2033  Protein: 77 g  Carbohydrate: 282 g  Fat: 72 g  Sodium: 1971 mg Document Released: 03/17/2005 Document Revised: 06/09/2011 Document Reviewed: 06/18/2009 [employer] Patient Information 2014 [employer] [place].

## 2013-05-11 NOTE — Progress Notes (Signed)
Allyson SabalStephen R Fross Date of Birth: 1933/10/22   History of Present Illness: [name] is seen today for followup. He has a history of coronary disease with coronary bypass surgery in 2000 he. He had a Myoview study in October 2012 showed mild apical ischemia. This is consistent with known severe distal LAD disease. Prior echocardiogram was unremarkable. He does have chronic edema. He was seen in by primary care with complaints of increased edema, cough, SOB and 12 lb weight gain. Lasix was increased to 40 mg tid. Amlodipine was stopped. Losartan was added. BNP was elevated at 198. BMET checked 2 weeks later showed stable creatinine of 1.5. He is feeling much better now. Weight decreased from 229>>214 lbs. Edema improved. SOB has improved. No chest pain.  Current Outpatient Prescriptions on File Prior to Visit  Medication Sig Dispense Refill  . allopurinol (ZYLOPRIM) 300 MG tablet Take 300 mg by mouth daily.        . benztropine (COGENTIN) 1 MG tablet Take 1/2 tablet in the morning Take 1/2 tablet in the afternoon      . brimonidine (ALPHAGAN P) 0.1 % SOLN Place 1 drop into both eyes 2 (two) times daily.       . clopidogrel (PLAVIX) 75 MG tablet Take 75 mg by mouth daily.        . clotrimazole-betamethasone (LOTRISONE) cream       . dorzolamide-timolol (COSOPT) 22.3-6.8 MG/ML ophthalmic solution Place 1 drop into both eyes 3 (three) times daily.       [name]. glipiZIDE-metformin (METAGLIP) 5-500 MG per tablet Take 1 tablet by mouth 2 (two) times daily before a meal.       . metoprolol (LOPRESSOR) 50 MG tablet TAKE ONE TABLET BY MOUTH TWICE DAILY  60 tablet  11  . pantoprazole (PROTONIX) 40 MG tablet Take 40 mg by mouth daily.      . polyethylene glycol (MIRALAX / GLYCOLAX) packet Take 17 g by mouth daily as needed. For constipation      . QUEtiapine (SEROQUEL) 50 MG tablet Take 50 mg by mouth 3 (three) times daily.       . sertraline (ZOLOFT) 25 MG tablet Take 25 mg by mouth daily.      .  simvastatin (ZOCOR) 20 MG tablet TAKE ONE TABLET BY MOUTH IN THE EVENING  90 tablet  3  . travoprost, benzalkonium, (TRAVATAN) 0.004 % ophthalmic solution Place 1 drop into both eyes at bedtime.        . vitamin E (ALPH-E) 400 UNIT capsule Take 400 Units by mouth 2 (two) times daily.        No current facility-administered medications on file prior to visit.    No Known Allergies  Past Medical History  Diagnosis Date  . Coronary artery disease   . Diabetes mellitus   . Hypertension   . Hyperlipidemia   . CVD (cerebrovascular disease)   . Gout   . Glaucoma   . Paranoid schizophrenia   . Tremor   . History of orthostatic hypotension   . Dyspnea   . Diastolic dysfunction   . Fracture, humerus     right  . Parkinsonism 12/02/2012  . Abnormality of gait 12/02/2012  . Obesity     Past Surgical History  Procedure Laterality Date  . Cardiac catheterization  08/31/2006    MODERATE INFERIOR WALL HYPOKINESIA WITH EF 45%  . Coronary artery bypass graft  2008    LIMA GRAFT TO THE LAD, SAPHENOUS VEIN GRAFT TO  THE DIAGONAL , SEQUENTIAL SAPHENOUS VEIN GRAFT TO THE PDA, POSTERIOR LATERAL , AND OBTUSE MARGINAL VESSELS.  . Tonsillectomy    . Asd repair    . Cholecystectomy      History  Smoking status  . Former Smoker  . Quit date: 03/31/1958  Smokeless tobacco  . Not on file    Comment: quit 1960's    History  Alcohol Use No    Family History  Problem Relation Age of Onset  . Diabetes Father   . Cystic fibrosis Father   . Heart disease Father     Review of Systems: As noted in HPI. All other systems were reviewed and are negative.  Physical Exam: BP 113/62  Pulse 70  Ht 5\' 7"  (1.702 m)  Wt 215 lb (97.523 kg)  BMI 33.67 kg/m2 He is an elderly white male in no acute distress. He is in a wheelchair. He wears glasses. His pupils are equal and reactive. Oropharynx is clear. Neck supple no JVD, adenopathy, thyromega.ly, or bruits. Lungs are clear. Cardiac exam reveals a  regular rate and rhythm without gallop or murmur. Abdomen is obese and nontender. He has 2+ pretibial edema. He has a significant tremor in his hands he. He is alert and oriented x3. Cranial nerves II through XII are intact.  LABORATORY DATA: Dated 04/22/13: BUN 35, creatinine-1.5, potassium 4.1, sodium 136.  Assessment / Plan: 1. Coronary disease status post CABG in 2008. Myoview study in October 2012 showed mild apical ischemia. This is consistent with known severe distal LAD disease. Echocardiogram was also unremarkable. I recommend continuing on his current medical therapy.  2. Hypertension, controlled.  3. Acute on chronic diastolic CHF. Improved with medication adjustments above.  Continue his current Lasix dose of 40 tid. We will update an Echocardiogram. Discussed importance of sodium restriction. His wife reports they eat a lot of prepackaged meals that are high in sodium. Discussed healthy alternatives.  4. Diabetes mellitus  5. Recurrent falls related to leg weakness.  6. Intermittent LBBB

## 2013-06-01 ENCOUNTER — Other Ambulatory Visit: Payer: Self-pay | Admitting: Neurology

## 2013-06-02 NOTE — Telephone Encounter (Signed)
Former [facility] patient.  He had been prescribing this drug since 2011.

## 2013-06-03 ENCOUNTER — Ambulatory Visit (HOSPITAL_COMMUNITY): Payer: Medicare Other | Attending: Cardiology | Admitting: Cardiology

## 2013-06-03 ENCOUNTER — Encounter: Payer: Self-pay | Admitting: Cardiology

## 2013-06-03 DIAGNOSIS — Z87891 Personal history of nicotine dependence: Secondary | ICD-10-CM | POA: Insufficient documentation

## 2013-06-03 DIAGNOSIS — I10 Essential (primary) hypertension: Secondary | ICD-10-CM | POA: Insufficient documentation

## 2013-06-03 DIAGNOSIS — E119 Type 2 diabetes mellitus without complications: Secondary | ICD-10-CM | POA: Insufficient documentation

## 2013-06-03 DIAGNOSIS — I509 Heart failure, unspecified: Secondary | ICD-10-CM | POA: Insufficient documentation

## 2013-06-03 DIAGNOSIS — E785 Hyperlipidemia, unspecified: Secondary | ICD-10-CM | POA: Insufficient documentation

## 2013-06-03 DIAGNOSIS — I059 Rheumatic mitral valve disease, unspecified: Secondary | ICD-10-CM | POA: Insufficient documentation

## 2013-06-03 DIAGNOSIS — I5033 Acute on chronic diastolic (congestive) heart failure: Secondary | ICD-10-CM

## 2013-06-03 DIAGNOSIS — I379 Nonrheumatic pulmonary valve disorder, unspecified: Secondary | ICD-10-CM | POA: Insufficient documentation

## 2013-06-03 DIAGNOSIS — Z951 Presence of aortocoronary bypass graft: Secondary | ICD-10-CM | POA: Insufficient documentation

## 2013-06-03 DIAGNOSIS — I2510 Atherosclerotic heart disease of native coronary artery without angina pectoris: Secondary | ICD-10-CM

## 2013-06-03 NOTE — Progress Notes (Signed)
Echo performed. 

## 2013-06-06 ENCOUNTER — Telehealth: Payer: Self-pay | Admitting: Cardiology

## 2013-06-06 NOTE — Telephone Encounter (Signed)
Returned call to patient's wife echo results given.Appointment scheduled with Norma FredricksonLori Gerhardt NP 06/10/13.

## 2013-06-06 NOTE — Telephone Encounter (Signed)
Follow Up    Calling regarding ECHO results. Please call.

## 2013-06-10 ENCOUNTER — Encounter: Payer: Self-pay | Admitting: Nurse Practitioner

## 2013-06-10 ENCOUNTER — Ambulatory Visit (INDEPENDENT_AMBULATORY_CARE_PROVIDER_SITE_OTHER): Payer: Medicare Other | Admitting: Nurse Practitioner

## 2013-06-10 VITALS — BP 110/64 | HR 64 | Ht 67.0 in | Wt 217.4 lb

## 2013-06-10 DIAGNOSIS — R0602 Shortness of breath: Secondary | ICD-10-CM

## 2013-06-10 DIAGNOSIS — I2510 Atherosclerotic heart disease of native coronary artery without angina pectoris: Secondary | ICD-10-CM

## 2013-06-10 DIAGNOSIS — I5020 Unspecified systolic (congestive) heart failure: Secondary | ICD-10-CM

## 2013-06-10 DIAGNOSIS — I10 Essential (primary) hypertension: Secondary | ICD-10-CM

## 2013-06-10 LAB — BASIC METABOLIC PANEL
BUN: 47 mg/dL — ABNORMAL HIGH (ref 6–23)
CO2: 30 mEq/L (ref 19–32)
Calcium: 9.2 mg/dL (ref 8.4–10.5)
Chloride: 95 mEq/L — ABNORMAL LOW (ref 96–112)
Creatinine, Ser: 1.6 mg/dL — ABNORMAL HIGH (ref 0.4–1.5)
GFR: 43.48 mL/min — ABNORMAL LOW (ref >60.00)
Glucose, Bld: 109 mg/dL — ABNORMAL HIGH (ref 70–99)
Potassium: 3.7 mEq/L (ref 3.5–5.1)
Sodium: 134 mEq/L — ABNORMAL LOW (ref 135–145)

## 2013-06-10 LAB — BRAIN NATRIURETIC PEPTIDE: Pro B Natriuretic peptide (BNP): 84 pg/mL (ref 0.0–100.0)

## 2013-06-10 MED ORDER — LOSARTAN POTASSIUM 50 MG PO TABS: 50.0000 mg | ORAL_TABLET | Freq: Two times a day (BID) | ORAL | Status: DC

## 2013-06-10 NOTE — Progress Notes (Addendum)
[name] Date of Birth: 04-24-1933 Medical Record #[id number]  History of Present Illness: Mr. Nimmons is seen back today for a work in visit. Seen for Dr. Swaziland. He has known CAD with past CABG in 2000. Myoview in October of 2012 showed mild apical ischemia consistent with known severe distal LAD disease - normal EF at 56%. Last echo from 2012 showed normal EF.  Other issues include DM, HTN, HLD, CVD, gout, paranoid schizophrenia, tremor, Parkinson's.   Most recently seen here last month - complaining of increased edema, cough and SOB - weight was up. Had had his lasix increased, Norvasc stopped and losartan added. He improved clinically. Echo updated and his EF has decreased - down to 30 to 35%. Asked to come back for optimization of his medicines.  Comes in today. Here with his wife - she gives most of the history. His swelling has improved. Weight is stable at 212 to 214. Breathing ok. More tremor since his Cogentin has been cut back. Has not been checking his BP regularly.   Current Outpatient Prescriptions  Medication Sig Dispense Refill  . allopurinol (ZYLOPRIM) 300 MG tablet Take 300 mg by mouth daily.        . benztropine (COGENTIN) 1 MG tablet Take 1/2 tablet in the morning Take 1/2 tablet in the afternoon      . brimonidine (ALPHAGAN P) 0.1 % SOLN Place 1 drop into both eyes 2 (two) times daily.       . clopidogrel (PLAVIX) 75 MG tablet TAKE ONE TABLET BY MOUTH ONCE DAILY  90 tablet  3  . clotrimazole-betamethasone (LOTRISONE) cream Apply 1 application topically.       . dorzolamide-timolol (COSOPT) 22.3-6.8 MG/ML ophthalmic solution Place 1 drop into both eyes 3 (three) times daily.       . furosemide (LASIX) 40 MG tablet three times a day      . glipiZIDE-metformin (METAGLIP) 5-500 MG per tablet Take 1 tablet by mouth 2 (two) times daily before a meal.       . losartan (COZAAR) 50 MG tablet Take 1 tablet by mouth daily.      . metoprolol (LOPRESSOR) 50 MG tablet TAKE  ONE TABLET BY MOUTH TWICE DAILY  60 tablet  11  . pantoprazole (PROTONIX) 40 MG tablet Take 40 mg by mouth daily.      . polyethylene glycol (MIRALAX / GLYCOLAX) packet Take 17 g by mouth daily as needed. For constipation      . potassium chloride 20 MEQ/15ML (10%) solution TAKE 15 MLS (20 MEQ TOTAL) BY MOUTH 3 (THREE) TIMES DAILY.  Patient is taking 3 tablespoons a day.      [name] QUEtiapine (SEROQUEL) 50 MG tablet Take 50 mg by mouth 3 (three) times daily.       . sertraline (ZOLOFT) 25 MG tablet Take 25 mg by mouth daily.      . simvastatin (ZOCOR) 20 MG tablet TAKE ONE TABLET BY MOUTH IN THE EVENING  90 tablet  3  . travoprost, benzalkonium, (TRAVATAN) 0.004 % ophthalmic solution Place 1 drop into both eyes at bedtime.        . vitamin E (ALPH-E) 400 UNIT capsule Take 400 Units by mouth 2 (two) times daily.        No current facility-administered medications for this visit.    No Known Allergies  Past Medical History  Diagnosis Date  . Coronary artery disease   . Diabetes mellitus   . Hypertension   .  Hyperlipidemia   . CVD (cerebrovascular disease)   . Gout   . Glaucoma   . Paranoid schizophrenia   . Tremor   . History of orthostatic hypotension   . Dyspnea   . Diastolic dysfunction   . Fracture, humerus     right  . Parkinsonism 12/02/2012  . Abnormality of gait 12/02/2012  . Obesity     Past Surgical History  Procedure Laterality Date  . Cardiac catheterization  08/31/2006    MODERATE INFERIOR WALL HYPOKINESIA WITH EF 45%  . Coronary artery bypass graft  2008    LIMA GRAFT TO THE LAD, SAPHENOUS VEIN GRAFT TO THE DIAGONAL , SEQUENTIAL SAPHENOUS VEIN GRAFT TO THE PDA, POSTERIOR LATERAL , AND OBTUSE MARGINAL VESSELS.  . Tonsillectomy    . Asd repair    . Cholecystectomy      History  Smoking status  . Former Smoker  . Quit date: 03/31/1958  Smokeless tobacco  . Not on file    Comment: quit 1960's    History  Alcohol Use No    Family History  Problem Relation Age  of Onset  . Diabetes Father   . Cystic fibrosis Father   . Heart disease Father     Review of Systems: The review of systems is per the HPI.  All other systems were reviewed and are negative.  Physical Exam: BP 110/64  Pulse 64  Ht 5\' 7"  (1.702 m)  Wt 217 lb 6.4 oz (98.612 kg)  BMI 34.04 kg/m2 Patient is chronically ill but in no acute distress. Quite tremulous. Skin is warm and dry. Color is normal.  HEENT is unremarkable. Normocephalic/atraumatic. PERRL. Sclera are nonicteric. Neck is supple. No masses. No JVD. Lungs are clear. Cardiac exam shows a regular rate and rhythm. Abdomen is soft. Extremities are without edema. Gait and ROM are intact. No gross neurologic deficits noted.  Wt Readings from Last 3 Encounters:  06/10/13 217 lb 6.4 oz (98.612 kg)  05/11/13 215 lb (97.523 kg)  02/11/13 218 lb 1.9 oz (98.939 kg)     LABORATORY DATA:   PENDING  Lab Results  Component Value Date   WBC 10.8* 04/02/2010   HGB 13.9 11/29/2010   HCT 41.0 11/29/2010   PLT 355 04/02/2010   GLUCOSE 261* 11/29/2010   CHOL  Value: 191        ATP III CLASSIFICATION:  <200     mg/dL   Desirable  161-096200-239  mg/dL   Borderline High  >=045>=240    mg/dL   High 4/0/98116/03/2006   TRIG 196* 08/30/2006   HDL 29* 08/30/2006   LDLCALC  Value: 123        Total Cholesterol/HDL:CHD Risk Coronary Heart Disease Risk Table                     Men   Women  1/2 Average Risk   3.4   3.3* 08/30/2006   ALT 22 03/24/2010   AST 23 03/24/2010   NA 137 11/29/2010   K 3.7 11/29/2010   CL 100 11/29/2010   CREATININE 1.10 11/29/2010   BUN 15 11/29/2010   CO2 27 03/24/2010   INR 1.8* 09/03/2006   HGBA1C  Value: 7.9 (NOTE) The ADA recommends the following therapeutic goal for glycemic control related to Hgb A1c measurement: Goal of therapy: <6.5 Hgb A1c  Reference: American Diabetes Association: Clinical Practice Recommendations 2010, Diabetes Care, 2010, 33: (Suppl  1).* 03/27/2009    Echo Study Conclusions from 05/2013  -  Left ventricle: Poor image  quality precludes adequate visualization of the LV apex. Susupect that the apex is at least moderately hypokinetic. Suggest definity contrast study to further delineate. There is mid and distal septal akinesis The cavity size was normal. There was mild concentric hypertrophy. Systolic function was moderately to severely reduced. The estimated ejection fraction was in the range of 30% to 35%. There is akinesis of the mid-distalanteroseptal myocardium. - Ventricular septum: Septal motion showed paradox. - Aortic valve: Moderate diffuse thickening and calcification, consistent with sclerosis. Trivial regurgitation. - Aorta: Ascending aortic diameter: 38mm (S). - Ascending aorta: The ascending aorta was mildly dilated. - Mitral valve: Calcified annulus. Mildly thickened leaflets . - Left atrium: The atrium was mildly dilated. - Pulmonary arteries: PA peak pressure: 39mm Hg (S).    Assessment / Plan: 1. Ischemic CM - EF now down at 30 to 35% - will try to optimize his medicines. BP ok for now but may have some issues as his Parkinson's advances. Reminded about the need for salt restriction, daily weights. May try to change over his metoprolol tartrate to Coreg in the future. Recheck BMET today. See back in 3 to 4 weeks.   2. CAD - remote CABG - last Myoview in 2012 with mild apical ischemic and felt to be consistent with his known severe distal LAD disease - no active chest pain. Would favor conservative management.   3. HTN  4. HLD  5. DM  Patient is agreeable to this plan and will call if any problems develop in the interim.   Rosalio Macadamia, RN, ANP-C [facility] [address] [place], [place]  [address] [phone]   Yes I would switch to Coreg. Peter ----- Message ----- From: Rosalio Macadamia, NP Sent: 06/10/2013 3:34 PM To: Peter M Swaziland, MD Theron Arista, Are you ok with switching over to Coreg or to Toprol?

## 2013-06-10 NOTE — Patient Instructions (Addendum)
Stay on your current medicines but we need to increase the Losartan to two times a day - this has been sent to the drug store - [facility]  Keep restricting your salt  See me & Dr. SwazilandJordan back in 3 to 4 weeks  I will send a note to Dr. Jacky KindleAronson  Monitor the blood pressure at home and keep a record  We will check lab today  Call the [facility] office at [phone] if you have any questions, problems or concerns.

## 2013-06-13 ENCOUNTER — Telehealth: Payer: Self-pay | Admitting: Nurse Practitioner

## 2013-06-13 NOTE — Telephone Encounter (Signed)
New problem ° ° °Pt returning your call concerning lab results. Please call °

## 2013-06-15 ENCOUNTER — Other Ambulatory Visit: Payer: Self-pay | Admitting: *Deleted

## 2013-06-15 DIAGNOSIS — N189 Chronic kidney disease, unspecified: Secondary | ICD-10-CM

## 2013-06-15 MED ORDER — FUROSEMIDE 40 MG PO TABS: 40.0000 mg | ORAL_TABLET | Freq: Two times a day (BID) | ORAL | Status: DC

## 2013-06-24 ENCOUNTER — Encounter: Payer: Self-pay | Admitting: Neurology

## 2013-06-24 ENCOUNTER — Other Ambulatory Visit (INDEPENDENT_AMBULATORY_CARE_PROVIDER_SITE_OTHER): Payer: Medicare Other

## 2013-06-24 ENCOUNTER — Ambulatory Visit (INDEPENDENT_AMBULATORY_CARE_PROVIDER_SITE_OTHER): Payer: Medicare Other | Admitting: Neurology

## 2013-06-24 VITALS — BP 116/61 | HR 67 | Temp 98.1°F | Ht 67.0 in | Wt 215.0 lb

## 2013-06-24 DIAGNOSIS — G20 Parkinson's disease: Secondary | ICD-10-CM

## 2013-06-24 DIAGNOSIS — N189 Chronic kidney disease, unspecified: Secondary | ICD-10-CM

## 2013-06-24 DIAGNOSIS — R269 Unspecified abnormalities of gait and mobility: Secondary | ICD-10-CM

## 2013-06-24 DIAGNOSIS — G20A1 Parkinson's disease without dyskinesia, without mention of fluctuations: Secondary | ICD-10-CM

## 2013-06-24 LAB — BASIC METABOLIC PANEL
BUN: 38 mg/dL — ABNORMAL HIGH (ref 6–23)
CO2: 31 mEq/L (ref 19–32)
Calcium: 9.1 mg/dL (ref 8.4–10.5)
Chloride: 93 mEq/L — ABNORMAL LOW (ref 96–112)
Creatinine, Ser: 1.7 mg/dL — ABNORMAL HIGH (ref 0.4–1.5)
GFR: 41.43 mL/min — ABNORMAL LOW (ref >60.00)
Glucose, Bld: 170 mg/dL — ABNORMAL HIGH (ref 70–99)
Potassium: 3.9 mEq/L (ref 3.5–5.1)
Sodium: 132 mEq/L — ABNORMAL LOW (ref 135–145)

## 2013-06-24 MED ORDER — CARBIDOPA-LEVODOPA 25-100 MG PO TABS: ORAL_TABLET | ORAL | Status: DC

## 2013-06-24 NOTE — Patient Instructions (Signed)

## 2013-06-24 NOTE — Progress Notes (Signed)
Reason for visit: Parkinsonism  Terrance Perez is an 80 y.o. male  History of present illness:  Terrance Perez is a 80 year old left-handed white male with a history of parkinsonism and a history of paranoid schizophrenia. The patient was on dopamine blocking agents, but he has been switched to Seroquel at this point. The patient indicates that he does have frequent hallucinations, usually seeing people inside the house that are not threatening to him. The patient has limited mobility, he can walk short distances with a walker. The patient reports no falls. The patient will occasionally have swallowing problems with solids, not liquids. The patient sleeps in a chair, as he is having problems with breathing when he is lying flat. The patient has a lift chair, and a bedside commode. The patient returns to the office today for an evaluation.  Past Medical History  Diagnosis Date  . Coronary artery disease   . Diabetes mellitus   . Hypertension   . Hyperlipidemia   . CVD (cerebrovascular disease)   . Gout   . Glaucoma   . Paranoid schizophrenia   . Tremor   . History of orthostatic hypotension   . Dyspnea   . Diastolic dysfunction   . Fracture, humerus     right  . Parkinsonism 12/02/2012  . Abnormality of gait 12/02/2012  . Obesity     Past Surgical History  Procedure Laterality Date  . Cardiac catheterization  08/31/2006    MODERATE INFERIOR WALL HYPOKINESIA WITH EF 45%  . Coronary artery bypass graft  2008    LIMA GRAFT TO THE LAD, SAPHENOUS VEIN GRAFT TO THE DIAGONAL , SEQUENTIAL SAPHENOUS VEIN GRAFT TO THE PDA, POSTERIOR LATERAL , AND OBTUSE MARGINAL VESSELS.  . Tonsillectomy    . Asd repair    . Cholecystectomy      Family History  Problem Relation Age of Onset  . Diabetes Father   . Cystic fibrosis Father   . Heart disease Father     Social history:  reports that he quit smoking about 55 years ago. He does not have any smokeless tobacco history on file. He reports  that he does not drink alcohol or use illicit drugs.   No Known Allergies  Medications:  Current Outpatient Prescriptions on File Prior to Visit  Medication Sig Dispense Refill  . allopurinol (ZYLOPRIM) 300 MG tablet Take 300 mg by mouth daily.        . benztropine (COGENTIN) 1 MG tablet Take 1/2 tablet in the morning Take 1/2 tablet in the afternoon      . brimonidine (ALPHAGAN P) 0.1 % SOLN Place 1 drop into both eyes 2 (two) times daily.       . clopidogrel (PLAVIX) 75 MG tablet TAKE ONE TABLET BY MOUTH ONCE DAILY  90 tablet  3  . clotrimazole-betamethasone (LOTRISONE) cream Apply 1 application topically.       . dorzolamide-timolol (COSOPT) 22.3-6.8 MG/ML ophthalmic solution Place 1 drop into both eyes 2 (two) times daily.       . furosemide (LASIX) 40 MG tablet Take 1 tablet (40 mg total) by mouth 2 (two) times daily.  30 tablet  0  . glipiZIDE-metformin (METAGLIP) 5-500 MG per tablet Take 1 tablet by mouth 2 (two) times daily before a meal.       . losartan (COZAAR) 50 MG tablet Take 1 tablet (50 mg total) by mouth 2 (two) times daily.  60 tablet  6  . metoprolol (LOPRESSOR) 50 MG tablet  TAKE ONE TABLET BY MOUTH TWICE DAILY  60 tablet  11  . pantoprazole (PROTONIX) 40 MG tablet Take 40 mg by mouth daily.      . polyethylene glycol (MIRALAX / GLYCOLAX) packet Take 17 g by mouth daily as needed. For constipation      . potassium chloride 20 MEQ/15ML (10%) solution TAKE 15 MLS (20 MEQ TOTAL) BY MOUTH 3 (THREE) TIMES DAILY.  Patient is taking 3 tablespoons a day.      [name] QUEtiapine (SEROQUEL) 50 MG tablet Take 50 mg by mouth 3 (three) times daily.       . sertraline (ZOLOFT) 25 MG tablet Take 25 mg by mouth daily.      . simvastatin (ZOCOR) 20 MG tablet TAKE ONE TABLET BY MOUTH IN THE EVENING  90 tablet  3  . travoprost, benzalkonium, (TRAVATAN) 0.004 % ophthalmic solution Place 1 drop into both eyes at bedtime.        . vitamin E (ALPH-E) 400 UNIT capsule Take 400 Units by mouth 2 (two)  times daily.        No current facility-administered medications on file prior to visit.    ROS:  Out of a complete 14 system review of symptoms, the patient complains only of the following symptoms, and all other reviewed systems are negative.  Difficulty swallowing Decreased vision Leg swelling Cold intolerance Constipation Restless legs Difficulty urinating, incontinence of bladder, frequency of urination, blood in the urine, recent bladder infection Back pain, walking difficulties Tremor Confusion, hallucinations  Blood pressure 116/61, pulse 67, temperature 98.1 F (36.7 C), temperature source Oral, height 5\' 7"  (1.702 m), weight 215 lb (97.523 kg).  Physical Exam  General: The patient is alert and cooperative at the time of the examination.Patient is moderately obese.  Skin: 2-3+ edema below the knees is noted bilaterally.   Neurologic Exam  Mental status: The patient is alert and cooperative.  Cranial nerves: Facial symmetry is present. Speech is normal, no aphasia or dysarthria is noted. Extraocular movements are full. Visual fields are full.  Motor: The patient has good strength in all 4 extremities.  Sensory examination:Soft touch sensation is symmetric on the face, arms, or legs.  Coordination: The patient has good finger-nose-finger and heel-to-shin bilaterally.A resting tremor seen on the left greater than right upper extremity.  Gait and station: The patient is able to stand, and walk with assistance. The patient has short shuffling steps, difficulty with turns. Romberg is negative.  No drift is seen.  Reflexes: Deep tendon reflexes are symmetric.   Assessment/Plan:  One. Parkinsonism  2. Gait disorder  The patient will begin low-dose Sinemet at this time taking the 25/100 mg tablets one half tablet twice daily for 3 weeks, and then take one half tablet 3 times daily. The family is to contact our office if the patient is having increased confusion on  this medication. The patient will otherwise followup in 4 or 5 months.  Marlan Palau MD 06/24/2013 3:58 PM  [facility] [address] [place], [place] [address]  Phone [phone] Fax [phone]

## 2013-06-30 ENCOUNTER — Telehealth: Payer: Self-pay | Admitting: *Deleted

## 2013-06-30 MED ORDER — LOSARTAN POTASSIUM 100 MG PO TABS: 100.0000 mg | ORAL_TABLET | Freq: Every day | ORAL | Status: DC

## 2013-06-30 NOTE — Telephone Encounter (Signed)
Alfonse RasJeremy Smart Pharm D approved change 2015 formulary [facility]

## 2013-07-04 ENCOUNTER — Ambulatory Visit: Payer: Medicare Other | Admitting: Nurse Practitioner

## 2013-07-04 ENCOUNTER — Other Ambulatory Visit: Payer: Self-pay | Admitting: *Deleted

## 2013-07-04 MED ORDER — METOPROLOL TARTRATE 50 MG PO TABS: ORAL_TABLET | ORAL | Status: DC

## 2013-07-08 ENCOUNTER — Ambulatory Visit (INDEPENDENT_AMBULATORY_CARE_PROVIDER_SITE_OTHER): Payer: Medicare Other | Admitting: Nurse Practitioner

## 2013-07-08 ENCOUNTER — Encounter: Payer: Self-pay | Admitting: Nurse Practitioner

## 2013-07-08 VITALS — BP 112/68 | HR 68 | Ht 67.0 in | Wt 218.1 lb

## 2013-07-08 DIAGNOSIS — I2510 Atherosclerotic heart disease of native coronary artery without angina pectoris: Secondary | ICD-10-CM

## 2013-07-08 DIAGNOSIS — I2589 Other forms of chronic ischemic heart disease: Secondary | ICD-10-CM

## 2013-07-08 DIAGNOSIS — I5022 Chronic systolic (congestive) heart failure: Secondary | ICD-10-CM

## 2013-07-08 DIAGNOSIS — I255 Ischemic cardiomyopathy: Secondary | ICD-10-CM

## 2013-07-08 DIAGNOSIS — I509 Heart failure, unspecified: Secondary | ICD-10-CM

## 2013-07-08 DIAGNOSIS — I5033 Acute on chronic diastolic (congestive) heart failure: Secondary | ICD-10-CM

## 2013-07-08 LAB — BASIC METABOLIC PANEL
BUN: 44 mg/dL — ABNORMAL HIGH (ref 6–23)
CO2: 30 mEq/L (ref 19–32)
Calcium: 9.1 mg/dL (ref 8.4–10.5)
Chloride: 95 mEq/L — ABNORMAL LOW (ref 96–112)
Creatinine, Ser: 1.6 mg/dL — ABNORMAL HIGH (ref 0.4–1.5)
GFR: 43.48 mL/min — ABNORMAL LOW (ref >60.00)
Glucose, Bld: 180 mg/dL — ABNORMAL HIGH (ref 70–99)
Potassium: 3.8 mEq/L (ref 3.5–5.1)
Sodium: 133 mEq/L — ABNORMAL LOW (ref 135–145)

## 2013-07-08 NOTE — Progress Notes (Signed)
Terrance Perez Date of Birth: [birth year] Medical Record #[id number]  History of Present Illness: Terrance Perez is seen back today for a follow up visit. Seen for Dr. Swazil[place]. He has known CAD with past CABG in 2000. Myoview in October of 2012 showed mild apical ischemia consistent with known severe distal LAD disease - normal EF at 56%. Last echo from 2012 showed normal EF.   Other issues include DM, HTN, HLD, CVD, gout, paranoid schizophrenia, tremor, Parkinson's.   Seen back in February of 2014 - complaining of increased edema, cough and SOB - weight was up. Had had his lasix increased, Norvasc stopped and losartan added. He improved clinically. Echo updated and his EF has decreased - down to 30 to 35%. I saw him back in march to start optimizing his medicines. Losartan was increased. Also going to try to change to Coreg.   Comes back today. Here with his wife. She gives a lot of the history.  He has done well since his last visit. Swelling is controlled. Weight is stable at home. He is not short of breath. Not dizzy or lightheaded. BP at home looks good - one reading down to 98/58 but mainly 115 to120/80's. No chest pain.   Current Outpatient Prescriptions  Medication Sig Dispense Refill  . ACCU-CHEK COMPACT PLUS test strip       . allopurinol (ZYLOPRIM) 300 MG tablet Take 300 mg by mouth daily.        . benztropine (COGENTIN) 1 MG tablet Take 1/2 tablet in the morning Take 1/2 tablet in the afternoon      . brimonidine (ALPHAGAN P) 0.1 % SOLN Place 1 drop into both eyes 2 (two) times daily.       . carbidopa-levodopa (SINEMET IR) 25-100 MG per tablet One half tablet twice a day for 3 weeks, then take one half tablet three times a day  50 tablet  4  . clopidogrel (PLAVIX) 75 MG tablet TAKE ONE TABLET BY MOUTH ONCE DAILY  90 tablet  3  . clotrimazole-betamethasone (LOTRISONE) cream Apply 1 application topically.       . dorzolamide-timolol (COSOPT) 22.3-6.8 MG/ML ophthalmic solution Place  1 drop into both eyes 2 (two) times daily.       . furosemide (LASIX) 40 MG tablet Take 1 tablet (40 mg total) by mouth 2 (two) times daily.  30 tablet  0  . glipiZIDE-metformin (METAGLIP) 5-500 MG per tablet Take 1 tablet by mouth 2 (two) times daily before a meal.       . losartan (COZAAR) 100 MG tablet Take 1 tablet (100 mg total) by mouth daily.  30 tablet  6  . metoprolol (LOPRESSOR) 50 MG tablet TAKE ONE TABLET BY MOUTH TWICE DAILY  60 tablet  5  . pantoprazole (PROTONIX) 40 MG tablet Take 40 mg by mouth daily.      . polyethylene glycol (MIRALAX / GLYCOLAX) packet Take 17 g by mouth daily as needed. For constipation      . potassium chloride 20 MEQ/15ML (10%) solution TAKE 15 MLS (20 MEQ TOTAL) BY MOUTH 3 (THREE) TIMES DAILY.  Patient is taking 3 tablespoons a day.      [name] QUEtiapine (SEROQUEL) 50 MG tablet Take 50 mg by mouth 3 (three) times daily.       . sertraline (ZOLOFT) 25 MG tablet Take 25 mg by mouth daily.      . silver sulfADIAZINE (SILVADENE) 1 % cream Apply 1 application topically daily.      [name]  simvastatin (ZOCOR) 20 MG tablet TAKE ONE TABLET BY MOUTH IN THE EVENING  90 tablet  3  . travoprost, benzalkonium, (TRAVATAN) 0.004 % ophthalmic solution Place 1 drop into both eyes at bedtime.        . vitamin E (ALPH-E) 400 UNIT capsule Take 400 Units by mouth 2 (two) times daily.        No current facility-administered medications for this visit.    No Known Allergies  Past Medical History  Diagnosis Date  . Coronary artery disease   . Diabetes mellitus   . Hypertension   . Hyperlipidemia   . CVD (cerebrovascular disease)   . Gout   . Glaucoma   . Paranoid schizophrenia   . Tremor   . History of orthostatic hypotension   . Dyspnea   . Diastolic dysfunction   . Fracture, humerus     right  . Parkinsonism 12/02/2012  . Abnormality of gait 12/02/2012  . Obesity     Past Surgical History  Procedure Laterality Date  . Cardiac catheterization  08/31/2006    MODERATE  INFERIOR WALL HYPOKINESIA WITH EF 45%  . Coronary artery bypass graft  2008    LIMA GRAFT TO THE LAD, SAPHENOUS VEIN GRAFT TO THE DIAGONAL , SEQUENTIAL SAPHENOUS VEIN GRAFT TO THE PDA, POSTERIOR LATERAL , AND OBTUSE MARGINAL VESSELS.  . Tonsillectomy    . Asd repair    . Cholecystectomy      History  Smoking status  . Former Smoker  . Quit date: 03/31/1958  Smokeless tobacco  . Not on file    Comment: quit 1960's    History  Alcohol Use No    Family History  Problem Relation Age of Onset  . Diabetes Father   . Cystic fibrosis Father   . Heart disease Father     Review of Systems: The review of systems is per the HPI.  All other systems were reviewed and are negative.  Physical Exam: BP 112/68  Pulse 68  Ht 5\' 7"  (1.702 m)  Wt 218 lb 1.9 oz (98.939 kg)  BMI 34.15 kg/m2  SpO2 96% Patient is very pleasant and in no acute distress. Seems calmer today. Less tremor. Skin is warm and dry. Color is normal.  HEENT is unremarkable. Normocephalic/atraumatic. PERRL. Sclera are nonicteric. Neck is supple. No masses. No JVD. Lungs are clear. Cardiac exam shows a regular rate and rhythm. Abdomen is soft. Extremities are with trace edema. He has support stockings in place. Resting tremor noted.  No gross neurologic deficits noted.  Wt Readings from Last 3 Encounters:  07/08/13 218 lb 1.9 oz (98.939 kg)  06/24/13 215 lb (97.523 kg)  06/10/13 217 lb 6.4 oz (98.612 kg)     LABORATORY DATA: BMET is pending  Lab Results  Component Value Date   WBC 10.8* 04/02/2010   HGB 13.9 11/29/2010   HCT 41.0 11/29/2010   PLT 355 04/02/2010   GLUCOSE 170* 06/24/2013   CHOL  Value: 191        ATP III CLASSIFICATION:  <200     mg/dL   Desirable  [id number]  mg/dL   Borderline High  >=469>=240    mg/dL   High 6/2/95286/03/2006   TRIG 196* 08/30/2006   HDL 29* 08/30/2006   LDLCALC  Value: 123        Total Cholesterol/HDL:CHD Risk Coronary Heart Disease Risk Table  Men   Women  1/2 Average Risk   3.4    3.3* 08/30/2006   ALT 22 03/24/2010   AST 23 03/24/2010   NA 132* 06/24/2013   K 3.9 06/24/2013   CL 93* 06/24/2013   CREATININE 1.7* 06/24/2013   BUN 38* 06/24/2013   CO2 31 06/24/2013   INR 1.8* 09/03/2006   HGBA1C  Value: 7.9 (NOTE) The ADA recommends the following therapeutic goal for glycemic control related to Hgb A1c measurement: Goal of therapy: <6.5 Hgb A1c  Reference: American Diabetes Association: Clinical Practice Recommendations 2010, Diabetes Care, 2010, 33: (Suppl  1).* 03/27/2009    Echo Study Conclusions from 05/2013  - Left ventricle: Poor image quality precludes adequate visualization of the LV apex. Susupect that the apex is at least moderately hypokinetic. Suggest definity contrast study to further delineate. There is mid and distal septal akinesis The cavity size was normal. There was mild concentric hypertrophy. Systolic function was moderately to severely reduced. The estimated ejection fraction was in the range of 30% to 35%. There is akinesis of the mid-distalanteroseptal myocardium. - Ventricular septum: Septal motion showed paradox. - Aortic valve: Moderate diffuse thickening and calcification, consistent with sclerosis. Trivial regurgitation. - Aorta: Ascending aortic diameter: 38mm (S). - Ascending aorta: The ascending aorta was mildly dilated. - Mitral valve: Calcified annulus. Mildly thickened leaflets . - Left atrium: The atrium was mildly dilated. - Pulmonary arteries: PA peak pressure: 39mm Hg (S).   Assessment / Plan:  1. Ischemic CM - EF now down at 30 to 35% - looks pretty good clinically. They have at least a month of Metoprolol on hand. Will keep him on his current regimen. Recheck BMET today. Consider changing to Coreg on return. See Dr. Swaziland in a month here. I will be happy to follow along as well.  2. CAD - remote CABG - last Myoview in 2012 with mild apical ischemic and felt to be consistent with his known severe distal LAD disease - no active  chest pain. Would favor conservative management.   3. HTN - great control. Probably not going to be able to add more medicine at this time.   4. HLD   5. DM   6. Parkinson's  7. Schizophrenia  Patient is agreeable to this plan and will call if any problems develop in the interim.   Rosalio Macadamia, RN, ANP-C  [facility]  [address]  [place], [place] [address]  [phone]

## 2013-07-08 NOTE — Patient Instructions (Signed)
Stay on your current medicines for now  We will check lab today  See Dr. SwazilandJordan in a month  Call the [facility] office at [phone] if you have any questions, problems or concerns.

## 2013-07-26 ENCOUNTER — Telehealth: Payer: Self-pay | Admitting: Nurse Practitioner

## 2013-07-26 NOTE — Telephone Encounter (Signed)
New message     Pt is low on metoprolol.  Lawson FiscalLori told pt to call when he is running low because she was going to change his medication to something else.

## 2013-07-27 MED ORDER — CARVEDILOL 12.5 MG PO TABS
6.2500 mg | ORAL_TABLET | Freq: Two times a day (BID) | ORAL | Status: DC
Start: 2013-07-27 — End: 2014-03-29

## 2013-07-27 NOTE — Telephone Encounter (Signed)
Follow up ° ° ° ° ° °Returning Terrance Perez's call °

## 2013-07-27 NOTE — Telephone Encounter (Signed)
S/w pt's wife is aware of medication change to Coreg (12.5 mg) take one half tablet bid [facility] [address]

## 2013-07-27 NOTE — Telephone Encounter (Signed)
Please change metoprolol to Coreg 12.5 mg BID  He is to start with just a half a tablet (6.25) two times a day.   Monitor BP at home

## 2013-07-27 NOTE — Telephone Encounter (Signed)
Left message on machine for pt to contact the office.   

## 2013-07-29 DIAGNOSIS — J189 Pneumonia, unspecified organism: Secondary | ICD-10-CM

## 2013-07-29 HISTORY — DX: Pneumonia, unspecified organism: J18.9

## 2013-08-04 ENCOUNTER — Encounter (HOSPITAL_COMMUNITY): Payer: Self-pay | Admitting: *Deleted

## 2013-08-04 ENCOUNTER — Inpatient Hospital Stay (HOSPITAL_COMMUNITY)
Admission: AD | Admit: 2013-08-04 | Discharge: 2013-08-09 | DRG: 193 | Disposition: A | Discharge status: Skilled Nursing Facility | Payer: Medicare Other | Source: Ambulatory Visit | Attending: Internal Medicine | Admitting: Internal Medicine

## 2013-08-04 DIAGNOSIS — E669 Obesity, unspecified: Secondary | ICD-10-CM | POA: Diagnosis present

## 2013-08-04 DIAGNOSIS — Z6834 Body mass index (BMI) 34.0-34.9, adult: Secondary | ICD-10-CM

## 2013-08-04 DIAGNOSIS — E876 Hypokalemia: Secondary | ICD-10-CM | POA: Diagnosis not present

## 2013-08-04 DIAGNOSIS — Z794 Long term (current) use of insulin: Secondary | ICD-10-CM

## 2013-08-04 DIAGNOSIS — G20A1 Parkinson's disease without dyskinesia, without mention of fluctuations: Secondary | ICD-10-CM | POA: Diagnosis present

## 2013-08-04 DIAGNOSIS — H409 Unspecified glaucoma: Secondary | ICD-10-CM | POA: Diagnosis present

## 2013-08-04 DIAGNOSIS — F200 Paranoid schizophrenia: Secondary | ICD-10-CM | POA: Diagnosis present

## 2013-08-04 DIAGNOSIS — Z8349 Family history of other endocrine, nutritional and metabolic diseases: Secondary | ICD-10-CM

## 2013-08-04 DIAGNOSIS — I5033 Acute on chronic diastolic (congestive) heart failure: Secondary | ICD-10-CM | POA: Diagnosis not present

## 2013-08-04 DIAGNOSIS — J189 Pneumonia, unspecified organism: Principal | ICD-10-CM | POA: Diagnosis present

## 2013-08-04 DIAGNOSIS — Z9089 Acquired absence of other organs: Secondary | ICD-10-CM

## 2013-08-04 DIAGNOSIS — I509 Heart failure, unspecified: Secondary | ICD-10-CM | POA: Diagnosis present

## 2013-08-04 DIAGNOSIS — Z8249 Family history of ischemic heart disease and other diseases of the circulatory system: Secondary | ICD-10-CM

## 2013-08-04 DIAGNOSIS — I700 Atherosclerosis of aorta: Secondary | ICD-10-CM | POA: Diagnosis present

## 2013-08-04 DIAGNOSIS — E785 Hyperlipidemia, unspecified: Secondary | ICD-10-CM | POA: Diagnosis present

## 2013-08-04 DIAGNOSIS — Z833 Family history of diabetes mellitus: Secondary | ICD-10-CM

## 2013-08-04 DIAGNOSIS — Z7902 Long term (current) use of antithrombotics/antiplatelets: Secondary | ICD-10-CM

## 2013-08-04 DIAGNOSIS — I999 Unspecified disorder of circulatory system: Secondary | ICD-10-CM | POA: Diagnosis present

## 2013-08-04 DIAGNOSIS — I2510 Atherosclerotic heart disease of native coronary artery without angina pectoris: Secondary | ICD-10-CM | POA: Diagnosis present

## 2013-08-04 DIAGNOSIS — Z87891 Personal history of nicotine dependence: Secondary | ICD-10-CM

## 2013-08-04 DIAGNOSIS — I10 Essential (primary) hypertension: Secondary | ICD-10-CM | POA: Diagnosis present

## 2013-08-04 DIAGNOSIS — Z8673 Personal history of transient ischemic attack (TIA), and cerebral infarction without residual deficits: Secondary | ICD-10-CM

## 2013-08-04 DIAGNOSIS — E871 Hypo-osmolality and hyponatremia: Secondary | ICD-10-CM | POA: Diagnosis present

## 2013-08-04 DIAGNOSIS — Z951 Presence of aortocoronary bypass graft: Secondary | ICD-10-CM

## 2013-08-04 DIAGNOSIS — G20 Parkinson's disease: Secondary | ICD-10-CM | POA: Diagnosis present

## 2013-08-04 DIAGNOSIS — M109 Gout, unspecified: Secondary | ICD-10-CM | POA: Diagnosis present

## 2013-08-04 DIAGNOSIS — I679 Cerebrovascular disease, unspecified: Secondary | ICD-10-CM | POA: Diagnosis present

## 2013-08-04 DIAGNOSIS — E1169 Type 2 diabetes mellitus with other specified complication: Secondary | ICD-10-CM | POA: Diagnosis present

## 2013-08-04 DIAGNOSIS — Z79899 Other long term (current) drug therapy: Secondary | ICD-10-CM

## 2013-08-04 LAB — COMPREHENSIVE METABOLIC PANEL
ALT: 6 U/L (ref 0–53)
AST: 29 U/L (ref 0–37)
Albumin: 3.2 g/dL — ABNORMAL LOW (ref 3.5–5.2)
Alkaline Phosphatase: 74 U/L (ref 39–117)
BUN: 27 mg/dL — ABNORMAL HIGH (ref 6–23)
CALCIUM: 8.3 mg/dL — AB (ref 8.4–10.5)
CO2: 24 mEq/L (ref 19–32)
CREATININE: 1.28 mg/dL (ref 0.50–1.35)
Chloride: 85 mEq/L — ABNORMAL LOW (ref 96–112)
GFR calc non Af Amer: 52 mL/min — ABNORMAL LOW (ref >90)
GFR, EST AFRICAN AMERICAN: 61 mL/min — AB (ref >90)
Glucose, Bld: 96 mg/dL (ref 70–99)
Potassium: 3.5 mEq/L — ABNORMAL LOW (ref 3.7–5.3)
Sodium: 123 mEq/L — ABNORMAL LOW (ref 137–147)
Total Bilirubin: 1.1 mg/dL (ref 0.3–1.2)
Total Protein: 6.9 g/dL (ref 6.0–8.3)

## 2013-08-04 LAB — CBC
HCT: 30.7 % — ABNORMAL LOW (ref 39.0–52.0)
Hemoglobin: 10.7 g/dL — ABNORMAL LOW (ref 13.0–17.0)
MCH: 31.3 pg (ref 26.0–34.0)
MCHC: 34.9 g/dL (ref 30.0–36.0)
MCV: 89.8 fL (ref 78.0–100.0)
PLATELETS: 198 10*3/uL (ref 150–400)
RBC: 3.42 MIL/uL — ABNORMAL LOW (ref 4.22–5.81)
RDW: 13.5 % (ref 11.5–15.5)
WBC: 4.1 10*3/uL (ref 4.0–10.5)

## 2013-08-04 LAB — GLUCOSE, CAPILLARY: Glucose-Capillary: 197 mg/dL — ABNORMAL HIGH (ref 70–99)

## 2013-08-04 LAB — TSH: TSH: 2.3 u[IU]/mL (ref 0.350–4.500)

## 2013-08-04 MED ORDER — DEXTROSE 5 % IV SOLN
500.0000 mg | INTRAVENOUS | Status: DC
  Administered 2013-08-04 – 2013-08-07 (×4): 500 mg via INTRAVENOUS
  Filled 2013-08-04 (×4): qty 500

## 2013-08-04 MED ORDER — POLYETHYLENE GLYCOL 3350 17 G PO PACK
17.0000 g | PACK | Freq: Every day | ORAL | Status: DC | PRN
  Filled 2013-08-04: qty 1

## 2013-08-04 MED ORDER — LOSARTAN POTASSIUM 50 MG PO TABS
100.0000 mg | ORAL_TABLET | Freq: Every day | ORAL | Status: DC
  Administered 2013-08-05 – 2013-08-09 (×5): 100 mg via ORAL
  Filled 2013-08-04 (×5): qty 2

## 2013-08-04 MED ORDER — GLIPIZIDE 5 MG PO TABS
5.0000 mg | ORAL_TABLET | Freq: Two times a day (BID) | ORAL | Status: DC
  Administered 2013-08-05: 5 mg via ORAL
  Filled 2013-08-04 (×3): qty 1

## 2013-08-04 MED ORDER — SIMVASTATIN 20 MG PO TABS
20.0000 mg | ORAL_TABLET | Freq: Every day | ORAL | Status: DC
  Administered 2013-08-04 – 2013-08-08 (×5): 20 mg via ORAL
  Filled 2013-08-04 (×6): qty 1

## 2013-08-04 MED ORDER — POTASSIUM CHLORIDE 20 MEQ/15ML (10%) PO LIQD
10.0000 meq | Freq: Every day | ORAL | Status: DC
  Administered 2013-08-05 – 2013-08-07 (×3): 10 meq via ORAL
  Filled 2013-08-04 (×4): qty 7.5

## 2013-08-04 MED ORDER — INSULIN ASPART 100 UNIT/ML ~~LOC~~ SOLN
0.0000 [IU] | Freq: Three times a day (TID) | SUBCUTANEOUS | Status: DC
  Administered 2013-08-07: 2 [IU] via SUBCUTANEOUS
  Administered 2013-08-07: 5 [IU] via SUBCUTANEOUS
  Administered 2013-08-08: 3 [IU] via SUBCUTANEOUS
  Administered 2013-08-08: 2 [IU] via SUBCUTANEOUS

## 2013-08-04 MED ORDER — ALLOPURINOL 300 MG PO TABS
300.0000 mg | ORAL_TABLET | Freq: Every day | ORAL | Status: DC
  Administered 2013-08-04 – 2013-08-09 (×6): 300 mg via ORAL
  Filled 2013-08-04 (×6): qty 1

## 2013-08-04 MED ORDER — ENOXAPARIN SODIUM 30 MG/0.3ML ~~LOC~~ SOLN
30.0000 mg | SUBCUTANEOUS | Status: DC
  Administered 2013-08-04: 30 mg via SUBCUTANEOUS
  Filled 2013-08-04 (×2): qty 0.3

## 2013-08-04 MED ORDER — BENZTROPINE MESYLATE 0.5 MG PO TABS
0.5000 mg | ORAL_TABLET | Freq: Every day | ORAL | Status: DC
  Filled 2013-08-04: qty 1

## 2013-08-04 MED ORDER — DEXTROSE 5 % IV SOLN
1.0000 g | INTRAVENOUS | Status: DC
  Administered 2013-08-04 – 2013-08-07 (×4): 1 g via INTRAVENOUS
  Filled 2013-08-04 (×4): qty 10

## 2013-08-04 MED ORDER — ACETAMINOPHEN 650 MG RE SUPP: 650.0000 mg | Freq: Four times a day (QID) | RECTAL | Status: DC | PRN

## 2013-08-04 MED ORDER — SERTRALINE HCL 25 MG PO TABS
25.0000 mg | ORAL_TABLET | Freq: Every day | ORAL | Status: DC
  Administered 2013-08-04 – 2013-08-09 (×6): 25 mg via ORAL
  Filled 2013-08-04 (×6): qty 1

## 2013-08-04 MED ORDER — ACETAMINOPHEN 325 MG PO TABS: 650.0000 mg | ORAL_TABLET | Freq: Four times a day (QID) | ORAL | Status: DC | PRN

## 2013-08-04 MED ORDER — FUROSEMIDE 40 MG PO TABS
40.0000 mg | ORAL_TABLET | Freq: Two times a day (BID) | ORAL | Status: DC
  Administered 2013-08-04: 40 mg via ORAL
  Filled 2013-08-04 (×4): qty 1

## 2013-08-04 MED ORDER — SODIUM CHLORIDE 0.9 % IV SOLN
INTRAVENOUS | Status: DC
Start: 2013-08-04 — End: 2013-08-08
  Administered 2013-08-04 – 2013-08-07 (×6): via INTRAVENOUS

## 2013-08-04 MED ORDER — CARVEDILOL 6.25 MG PO TABS
6.2500 mg | ORAL_TABLET | Freq: Two times a day (BID) | ORAL | Status: DC
  Administered 2013-08-04 – 2013-08-09 (×10): 6.25 mg via ORAL
  Filled 2013-08-04 (×11): qty 1

## 2013-08-04 MED ORDER — CLOPIDOGREL BISULFATE 75 MG PO TABS
75.0000 mg | ORAL_TABLET | Freq: Every day | ORAL | Status: DC
  Administered 2013-08-05 – 2013-08-09 (×5): 75 mg via ORAL
  Filled 2013-08-04 (×6): qty 1

## 2013-08-04 MED ORDER — QUETIAPINE FUMARATE 50 MG PO TABS
50.0000 mg | ORAL_TABLET | Freq: Three times a day (TID) | ORAL | Status: DC
  Administered 2013-08-04 – 2013-08-09 (×14): 50 mg via ORAL
  Filled 2013-08-04 (×17): qty 1

## 2013-08-04 MED ORDER — GLIPIZIDE-METFORMIN HCL 5-500 MG PO TABS: 1.0000 | ORAL_TABLET | Freq: Two times a day (BID) | ORAL | Status: DC

## 2013-08-04 MED ORDER — CARBIDOPA-LEVODOPA 25-100 MG PO TABS
0.5000 | ORAL_TABLET | Freq: Three times a day (TID) | ORAL | Status: DC
  Administered 2013-08-04 – 2013-08-09 (×14): 0.5 via ORAL
  Filled 2013-08-04 (×16): qty 0.5

## 2013-08-04 MED ORDER — PANTOPRAZOLE SODIUM 40 MG PO TBEC
40.0000 mg | DELAYED_RELEASE_TABLET | Freq: Every day | ORAL | Status: DC
  Administered 2013-08-05 – 2013-08-09 (×5): 40 mg via ORAL
  Filled 2013-08-04 (×5): qty 1

## 2013-08-04 NOTE — Progress Notes (Signed)
PHARMACIST - PHYSICIAN COMMUNICATION DR:  Jacky KindleAronson CONCERNING:  METFORMIN SAFE ADMINISTRATION POLICY  RECOMMENDATION: Metformin has been placed on DISCONTINUE (rejected order) STATUS and should be reordered only after any of the conditions below are ruled out.  Current safety recommendations include avoiding metformin for a minimum of 48 hours after the patient's exposure to intravenous contrast media.  DESCRIPTION:  The Pharmacy Committee has adopted a policy that restricts the use of metformin in hospitalized patients until all the contraindications to administration have been ruled out. Specific contraindications are: [x]  Serum creatinine ? 1.5 for males []  Serum creatinine ? 1.4 for females []  Shock, acute MI, sepsis, hypoxemia, dehydration []  Planned administration of intravenous iodinated contrast media []  Heart Failure patients with low EF []  Acute or chronic metabolic acidosis (including DKA)      Arley Phenixllen Angelissa Supan RPh 08/04/2013, 7:47 PM Pager [phone]

## 2013-08-04 NOTE — H&P (Signed)
PCP:   Minda MeoARONSON,Jaquez Farrington A, MD   Chief Complaint:  Weakness and cough  HPI: 80 yo wm with multiple chronic illnesses listed below admitted with RLL pneumonia, poor po intake, low blood sugars and progressive weakness.  Seen in office with acute illness and cxray performed [facility] confirmed rll pneumonia.  At that time, nontoxic and started on po fluids as well as levaquin.  No fever and resp status no worse but progressively weak with very little po intake.  At this point barely able to hold head up and unable to transfer/stand.  No chestpain, no n/v, no diarrhea and no gu sxs.  Admitted for ivf and iv antibiotics.  Past Medical History: Past Medical History  Diagnosis Date  . Coronary artery disease   . Diabetes mellitus   . Hypertension   . Hyperlipidemia   . CVD (cerebrovascular disease)   . Gout   . Glaucoma   . Paranoid schizophrenia   . Tremor   . History of orthostatic hypotension   . Dyspnea   . Diastolic dysfunction   . Fracture, humerus     right  . Parkinsonism 12/02/2012  . Abnormality of gait 12/02/2012  . Obesity    Past Surgical History  Procedure Laterality Date  . Cardiac catheterization  08/31/2006    MODERATE INFERIOR WALL HYPOKINESIA WITH EF 45%  . Coronary artery bypass graft  2008    LIMA GRAFT TO THE LAD, SAPHENOUS VEIN GRAFT TO THE DIAGONAL , SEQUENTIAL SAPHENOUS VEIN GRAFT TO THE PDA, POSTERIOR LATERAL , AND OBTUSE MARGINAL VESSELS.  . Tonsillectomy    . Asd repair    . Cholecystectomy      Medications: Prior to Admission medications   Medication Sig Start Date End Date Taking? Authorizing Provider  ACCU-CHEK COMPACT PLUS test strip  06/22/13   Historical Provider, MD  allopurinol (ZYLOPRIM) 300 MG tablet Take 300 mg by mouth daily.      Historical Provider, MD  benztropine (COGENTIN) 1 MG tablet Take 1/2 tablet in the morning Take 1/2 tablet in the afternoon    Historical Provider, MD  carbidopa-levodopa (SINEMET IR) 25-100 MG per tablet One  half tablet twice a day for 3 weeks, then take one half tablet three times a day 06/24/13   York Spanielharles K Willis, MD  carvedilol (COREG) 12.5 MG tablet Take 0.5 tablets (6.25 mg total) by mouth 2 (two) times daily. 07/27/13   Rosalio MacadamiaLori C Gerhardt, NP  clopidogrel (PLAVIX) 75 MG tablet TAKE ONE TABLET BY MOUTH ONCE DAILY    York Spanielharles K Willis, MD  clotrimazole-betamethasone (LOTRISONE) cream Apply 1 application topically.  06/13/12   Historical Provider, MD  furosemide (LASIX) 40 MG tablet Take 1 tablet (40 mg total) by mouth 2 (two) times daily. 06/15/13   Rosalio MacadamiaLori C Gerhardt, NP  glipiZIDE-metformin (METAGLIP) 5-500 MG per tablet Take 1 tablet by mouth 2 (two) times daily before a meal.  06/30/12   Historical Provider, MD  losartan (COZAAR) 100 MG tablet Take 1 tablet (100 mg total) by mouth daily. 06/30/13   Rosalio MacadamiaLori C Gerhardt, NP  pantoprazole (PROTONIX) 40 MG tablet Take 40 mg by mouth daily.    Historical Provider, MD  polyethylene glycol (MIRALAX / GLYCOLAX) packet Take 17 g by mouth daily as needed. For constipation    Historical Provider, MD  potassium chloride 20 MEQ/15ML (10%) solution TAKE 15 MLS (20 MEQ TOTAL) BY MOUTH 3 (THREE) TIMES DAILY.  Patient is taking 3 tablespoons a day. 05/04/13   Theron AristaPeter  M SwazilandJordan, MD  QUEtiapine (SEROQUEL) 50 MG tablet Take 50 mg by mouth 3 (three) times daily.     Historical Provider, MD  sertraline (ZOLOFT) 25 MG tablet Take 25 mg by mouth daily. 11/10/12   Historical Provider, MD  silver sulfADIAZINE (SILVADENE) 1 % cream Apply 1 application topically daily. 06/17/13   Historical Provider, MD  simvastatin (ZOCOR) 20 MG tablet TAKE ONE TABLET BY MOUTH IN THE EVENING 02/22/13   Peter M SwazilandJordan, MD  travoprost, benzalkonium, (TRAVATAN) 0.004 % ophthalmic solution Place 1 drop into both eyes at bedtime.      Historical Provider, MD  vitamin E (ALPH-E) 400 UNIT capsule Take 400 Units by mouth 2 (two) times daily.     Historical Provider, MD    Allergies:  No Known Allergies  Social  History:  reports that he quit smoking about 55 years ago. He does not have any smokeless tobacco history on file. He reports that he does not drink alcohol or use illicit drugs.  Family History: Family History  Problem Relation Age of Onset  . Diabetes Father   . Cystic fibrosis Father   . Heart disease Father     Physical Exam: There were no vitals filed for this visit. General appearance: fatigued, head slumped Head: Normocephalic, without obvious abnormality, atraumatic Eyes: conjunctivae/corneas clear. PERRL, EOM's intact.  Nose: Nares normal. Septum midline. Mucosa normal. No drainage or sinus tenderness. Throat: lips, mucosa, and tongue normal; teeth and gums normal Neck: no adenopathy, no carotid bruit, no JVD and thyroid not enlarged, symmetric, no tenderness/mass/nodules Resp: bronchophony RLL Cardio: regular rate and rhythm GI: soft, non-tender; bowel sounds normal; no masses,  no organomegaly Extremities: extremities normal, atraumatic, no cyanosis or edema Pulses: 2+ and symmetric Lymph nodes: Cervical adenopathy: no cervical lymphadenopathy Neurologic: somnolent, minimal conversation, moe x4, diffuse weakness    Labs on Admission:  No results found for this basename: NA, K, CL, CO2, GLUCOSE, BUN, CREATININE, CALCIUM, MG, PHOS,  in the last 72 hours No results found for this basename: AST, ALT, ALKPHOS, BILITOT, PROT, ALBUMIN,  in the last 72 hours No results found for this basename: LIPASE, AMYLASE,  in the last 72 hours No results found for this basename: WBC, NEUTROABS, HGB, HCT, MCV, PLT,  in the last 72 hours No results found for this basename: CKTOTAL, CKMB, CKMBINDEX, TROPONINI,  in the last 72 hours No results found for this basename: TSH, T4TOTAL, FREET3, T3FREE, THYROIDAB,  in the last 72 hours No results found for this basename: VITAMINB12, FOLATE, FERRITIN, TIBC, IRON, RETICCTPCT,  in the last 72 hours  Radiological Exams on Admission: No results  found. Orders placed in visit on 06/03/13  . EKG    Assessment/Plan Active Problems:   Pneumonia Admit for iv abx and ivf  Minda MeoRichard A Ayo Smoak 08/04/2013, 6:12 PM

## 2013-08-05 ENCOUNTER — Ambulatory Visit: Payer: Medicare Other | Admitting: Podiatrist

## 2013-08-05 LAB — GLUCOSE, CAPILLARY
GLUCOSE-CAPILLARY: 88 mg/dL (ref 70–99)
GLUCOSE-CAPILLARY: 155 mg/dL — AB (ref 70–99)
GLUCOSE-CAPILLARY: 111 mg/dL — AB (ref 70–99)
Glucose-Capillary: 103 mg/dL — ABNORMAL HIGH (ref 70–99)
Glucose-Capillary: 182 mg/dL — ABNORMAL HIGH (ref 70–99)

## 2013-08-05 MED ORDER — ALPRAZOLAM 0.5 MG PO TABS
0.5000 mg | ORAL_TABLET | Freq: Once | ORAL | Status: AC
  Administered 2013-08-05: 0.5 mg via ORAL
  Filled 2013-08-05: qty 1

## 2013-08-05 MED ORDER — GLYBURIDE 5 MG PO TABS
5.0000 mg | ORAL_TABLET | Freq: Two times a day (BID) | ORAL | Status: DC
  Administered 2013-08-05 – 2013-08-06 (×2): 5 mg via ORAL
  Filled 2013-08-05 (×4): qty 1

## 2013-08-05 MED ORDER — METFORMIN HCL 500 MG PO TABS
500.0000 mg | ORAL_TABLET | Freq: Two times a day (BID) | ORAL | Status: DC
  Filled 2013-08-05 (×2): qty 1

## 2013-08-05 MED ORDER — METFORMIN HCL 500 MG PO TABS
500.0000 mg | ORAL_TABLET | Freq: Two times a day (BID) | ORAL | Status: DC
  Administered 2013-08-05 – 2013-08-09 (×8): 500 mg via ORAL
  Filled 2013-08-05 (×10): qty 1

## 2013-08-05 MED ORDER — ONDANSETRON HCL 4 MG/2ML IJ SOLN
4.0000 mg | INTRAMUSCULAR | Status: DC | PRN
  Administered 2013-08-05: 4 mg via INTRAVENOUS
  Filled 2013-08-05: qty 2

## 2013-08-05 MED ORDER — ENOXAPARIN SODIUM 40 MG/0.4ML ~~LOC~~ SOLN
40.0000 mg | SUBCUTANEOUS | Status: DC
  Administered 2013-08-05 – 2013-08-08 (×4): 40 mg via SUBCUTANEOUS
  Filled 2013-08-05 (×5): qty 0.4

## 2013-08-05 NOTE — Progress Notes (Signed)
Subjective: Patient is resting this morning but will converse calls me by the name of Dr. and knows he is in the hospital. Seems a bit brighter. Still very congested and coughing with poor intake  Objective: Vital signs in last 24 hours: Temp:  [98.2 F (36.8 C)-98.7 F (37.1 C)] 98.7 F (37.1 C) (05/08 0445) Pulse Rate:  [76-85] 76 (05/08 0445) Resp:  [20] 20 (05/08 0445) BP: (125-140)/(62-77) 125/62 mmHg (05/08 0445) SpO2:  [96 %-99 %] 99 % (05/08 0445) Weight:  [99.3 kg (218 lb 14.7 oz)] 99.3 kg (218 lb 14.7 oz) (05/07 1830) Weight change:   CBG (last 3)   Recent Labs  08/04/13 2131 08/05/13 0532  GLUCAP 197* 103*    Intake/Output from previous day: 05/07 0701 - 05/08 0700 In: -  Out: 200 [Urine:200]  Physical Exam: Patient's eyes are closed but awakens and converses No JVD or bruits lying supine no distress moist cough Left chest is clear right chest with scattered rhonchi rales at the base Heart sounds distant regular Abdomen is obese soft nontender good bowel sounds Extremities with intact pulses trace edema venous stasis changes Neurologically he has chronic tardive dyskinesia from psychiatric meds diffusely weak nonlateralizing   Lab Results:  Recent Labs  08/04/13 1926  NA 123*  K 3.5*  CL 85*  CO2 24  GLUCOSE 96  BUN 27*  CREATININE 1.28  CALCIUM 8.3*    Recent Labs  08/04/13 1926  AST 29  ALT 6  ALKPHOS 74  BILITOT 1.1  PROT 6.9  ALBUMIN 3.2*    Recent Labs  08/04/13 1926  WBC 4.1  HGB 10.7*  HCT 30.7*  MCV 89.8  PLT 198   Lab Results  Component Value Date   INR 1.8* 09/03/2006   INR 1.1 08/30/2006   No results found for this basename: CKTOTAL, CKMB, CKMBINDEX, TROPONINI,  in the last 72 hours  Recent Labs  08/04/13 1926  TSH 2.300   No results found for this basename: VITAMINB12, FOLATE, FERRITIN, TIBC, IRON, RETICCTPCT,  in the last 72 hours  Studies/Results: No results found.   Assessment/Plan: #1  community-acquired pneumonia right lower lobe by outside chest x-ray done it tried imaging confirmed clinically failed outpatient regimen currently on IV antibiotics  #2 hyponatremia mildly dehydrated we'll hold his Lasix continue normal saline recheck sodium in the morning  #3 coronary artery disease stable  #4 diastolic heart failure well compensated if anything on the dry side given his several DL this see #2  #5 paranoid schizophrenia well compensated  #6 Parkinson's disease functionally limited but is normally able to ambulate short distance with assistance in transfer  #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 PT and OT have been consulted   LOS: 1 day   Minda Meoichard A Anishka Bushard 08/05/2013, 7:39 AM

## 2013-08-05 NOTE — Evaluation (Signed)
Occupational Therapy Evaluation Patient Details Name: Terrance SabalStephen R Perez MRN: [id number] DOB: June 11, 1933 Today's Date: 08/05/2013    History of Present Illness Pt admitted on 08/04/13 with weakness and cough and pnuemonia.   Clinical Impression   Pt presents with generalized weakness and requires +2 for safety with pivoting to the chair this am. No family present to confirm/discuss d/c plans. Will benefit from continued OT services to maximize ADL independence for next venue.    Follow Up Recommendations  Supervision/Assistance - 24 hour;SNF    Equipment Recommendations  None recommended by OT    Recommendations for Other Services       Precautions / Restrictions Precautions Precautions: Fall Precaution Comments: history of Parkinsonism Restrictions Weight Bearing Restrictions: No      Mobility Bed Mobility Overal bed mobility: +2 for physical assistance;+ 2 for safety/equipment;Needs Assistance Bed Mobility: Supine to Sit     Supine to sit: +2 for physical assistance;+2 for safety/equipment;Max assist     General bed mobility comments: use of bed pad to scoot around to EOB  Transfers Overall transfer level: Needs assistance Equipment used: Rolling walker (2 wheeled) Transfers: Sit to/from Stand Sit to Stand: From elevated surface;Min assist;Mod assist         General transfer comment: from an elevated bed surface. assist to rise and stabilize and then control descent to chair.    Balance                                            ADL Overall ADL's : Needs assistance/impaired Eating/Feeding: Set up;Sitting   Grooming: Wash/dry face;Set up;Sitting;Supervision/safety   Upper Body Bathing: Moderate assistance;Sitting   Lower Body Bathing: Maximal assistance;Sit to/from stand   Upper Body Dressing : Moderate assistance;Sitting   Lower Body Dressing: Total assistance;Sit to/from stand   Toilet Transfer: +2 for  safety/equipment;Stand-pivot;RW;Moderate assistance Toilet Transfer Details (indicate cue type and reason): especially to control descent to chair.  Toileting- Clothing Manipulation and Hygiene: Total assistance;Sit to/from stand         General ADL Comments: Pt tends to keep eyes closed during session. He states he has glaucoma and doesnt see very well. He is slow to respond to commands. He has difficulty recalling time framess on how long he has needed assist, been weaker than his usual, etc.      Vision                     Perception     Praxis      Pertinent Vitals/Pain No complaint of     Hand Dominance     Extremity/Trunk Assessment Upper Extremity Assessment Upper Extremity Assessment: RUE deficits/detail RUE Deficits / Details: only able to raise arm to about 90 degree shoulder flexion; generalized weakness bilateral UE. [facility] L [facility] to [facility] for [facility] flexion but weak. Note resting tremors.           Communication Communication Communication: No difficulties   Cognition Arousal/Alertness: Awake/alert Behavior During Therapy: WFL for tasks assessed/performed Overall Cognitive Status: No family/caregiver present to determine baseline cognitive functioning                     General Comments       Exercises       Shoulder Instructions      Home Living Family/patient expects to be discharged to:: Other (Comment) (  if family agrees) Living Arrangements: Spouse/significant other Available Help at Discharge: Family Type of Home: House                 Bathroom Toilet: Standard     Home Equipment: Environmental consultantWalker - 2 wheels;Bedside commode          Prior Functioning/Environment Level of Independence: Needs assistance    ADL's / Homemaking Assistance Needed: difficult to get accurate history from pt but he indicates he has needed assist with bathing and dressing for several years. He has help when he gets up by his grand daughter during  the day while wife is at work. Not sure from pt how long he has needed assist with ambulation.        OT Diagnosis: Generalized weakness   OT Problem List: Decreased strength;Decreased knowledge of use of DME or AE   OT Treatment/Interventions: Self-care/ADL training;Patient/family education;Therapeutic activities;DME and/or AE instruction    OT Goals(Current goals can be found in the care plan section) Acute Rehab OT Goals Patient Stated Goal: none stated OT Goal Formulation: With patient Time For Goal Achievement: 08/19/13 Potential to Achieve Goals: Good  OT Frequency: Min 2X/week   Barriers to D/C:            Co-evaluation              End of Session Equipment Utilized During Treatment: Gait belt;Rolling walker;Oxygen  Activity Tolerance: Patient limited by fatigue;Other (comment) (some nausea) Patient left: in chair;with call bell/phone within reach;with chair alarm set   Time: [phone] OT Time Calculation (min): 30 min Charges:  OT General Charges $OT Visit: 1 Procedure OT Evaluation $Initial OT Evaluation Tier I: 1 Procedure OT Treatments $Self Care/Home Management : 8-22 mins $Therapeutic Activity: 8-22 mins G-Codes:    Sabino GasserStephanie Stafford Celicia Minahan [phone] 08/05/2013, 9:40 AM

## 2013-08-05 NOTE — Progress Notes (Addendum)
PHARMACIST - PHYSICIAN COMMUNICATION DR:  Jacky KindleAronson CONCERNING:  METFORMIN SAFE ADMINISTRATION POLICY  Scr initially held per previous labs (from April) when SCr > 1.5mg /dl.  Scr last evening was 1.28  Plan: 1. OK to resume metformin 500mg  BID (with glyburide, to make glucovance) as per home.  Juliette Alcideustin Zeigler, PharmD, BCPS.   Pager: [phone]. 08/05/2013 11:05 AM

## 2013-08-05 NOTE — Progress Notes (Signed)
Clinical Social Work Department BRIEF PSYCHOSOCIAL ASSESSMENT 08/05/2013  Patient:  Terrance Perez,Terrance Perez     Account Number:  [id number]     Admit date:  08/04/2013  Clinical Social Worker:  Orpah GreekFOLEY,Angely Dietz, LCSWA  Date/Time:  08/05/2013 04:09 PM  Referred by:  Physician  Date Referred:  08/05/2013 Referred for  SNF Placement   Other Referral:   Interview type:  Family Other interview type:   patient's wife, Terrance DandyMary at bedside    PSYCHOSOCIAL DATA Living Status:  WIFE Admitted from facility:   Level of care:   Primary support name:  Terrance Perez (wife) h#: [phone] w#: [phone] c#: [phone] Primary support relationship to patient:  SPOUSE Degree of support available:   good    CURRENT CONCERNS Current Concerns  Post-Acute Placement   Other Concerns:    SOCIAL WORK ASSESSMENT / PLAN CSW reviewed PT/OT evaluation indicating supervision for mobility/OOB vs. SNF.   Assessment/plan status:  [facility] Other assessment/ plan:   Information/referral to community resources:   CSW completed FL2 should wife change her mind and decide on SNF.    PATIENT'S/FAMILY'S RESPONSE TO PLAN OF CARE: Patient's wife states that she works during the day but either their neighbor (who lives in the same building) or grandaughter stays with him. Wife states that as long as he is able to transfer from the wheelchair to potty, then they should be able to handle him at home. Wife informed CSW that patient was fine at home up until 2-3 days ago, ambulating with a rolling walker.       Terrance MaxinKelly Irini Leet, LCSW [facility] Clinical Social Worker cell #: [phone]

## 2013-08-05 NOTE — Progress Notes (Signed)
Clinical Social Work Department CLINICAL SOCIAL WORK PLACEMENT NOTE 08/05/2013  Patient:  Allyson SabalJOHNSON,Shloimy R  Account Number:  [id number] Admit date:  08/04/2013  Clinical Social Worker:  Orpah GreekKELLY FOLEY, LCSWA  Date/time:  08/05/2013 04:29 PM  Clinical Social Work is seeking post-discharge placement for this patient at the following level of care:   SKILLED NURSING   (*CSW will update this form in Epic as items are completed)   08/05/2013  Patient/family provided with [facility] of Clinical Social Work's list of facilities offering this level of care within the geographic area requested by the patient (or if unable, by the patient's family).  08/05/2013  Patient/family informed of their freedom to choose among providers that offer the needed level of care, that participate in Medicare, Medicaid or managed care program needed by the patient, have an available bed and are willing to accept the patient.  08/05/2013  Patient/family informed of [facility]' ownership interest in [facility], as well as of the fact that they are under no obligation to receive care at this facility.  PASARR submitted to EDS on  PASARR number received from EDS on   FL2 transmitted to all facilities in geographic area requested by pt/family on  08/05/2013 FL2 transmitted to all facilities within larger geographic area on   Patient informed that his/her managed care company has contracts with or will negotiate with  certain facilities, including the following:     Patient/family informed of bed offers received:   Patient chooses bed at  Physician recommends and patient chooses bed at    Patient to be transferred to  on   Patient to be transferred to facility by   The following physician request were entered in Epic:   Additional Comments:   Lincoln MaxinKelly Aviannah Castoro, LCSW [facility] Clinical Social Worker cell #: [phone]

## 2013-08-05 NOTE — Evaluation (Signed)
Physical Therapy Evaluation Patient Details Name: Terrance SabalStephen R Perez MRN: [id number] DOB: 04-14-1933 Today's Date: 08/05/2013   History of Present Illness  Pt is a 80 year old male admitted on 08/04/13 with weakness and pnuemonia.  PMHx CVD, Parkinsonism, HTN, CAD, tremor, glaucoma.  Clinical Impression  Pt currently with functional limitations due to the deficits listed below (see PT Problem List).  Pt will benefit from skilled PT to increase their independence and safety with mobility to allow discharge to the venue listed below. Pt reports increased weakness and fatigue today.  Pt agreeable to stand with RW in front of recliner but felt too weak to ambulate.  Recommend SNF unless family can provide assist for mobility.  Pt states granddaughter present during the day and spouse works.     Follow Up Recommendations SNF;Supervision for mobility/OOB    Equipment Recommendations  Wheelchair (measurements PT)    Recommendations for Other Services       Precautions / Restrictions Precautions Precautions: Fall Precaution Comments: history of Parkinsonism Restrictions Weight Bearing Restrictions: No      Mobility  Bed Mobility Overal bed mobility: +2 for physical assistance;+ 2 for safety/equipment;Needs Assistance Bed Mobility: Supine to Sit     Supine to sit: +2 for physical assistance;+2 for safety/equipment;Max assist     General bed mobility comments: pt up in recliner on arrival  Transfers Overall transfer level: Needs assistance Equipment used: Rolling walker (2 wheeled) Transfers: Sit to/from Stand Sit to Stand: Min assist         General transfer comment: assist to rise and control descent, able to tolerate standing for 4-5 minutes due to runny watery BM movement (hygiene and linen changed), pt felt unable to tolerate ambulation or pivot back to bed during this session (wished to stand only then return to recliner)  Ambulation/Gait                Stairs             Wheelchair Mobility    Modified Rankin (Stroke Patients Only)       Balance Overall balance assessment: Needs assistance         Standing balance support: Bilateral upper extremity supported Standing balance-Leahy Scale: Poor Standing balance comment: standing with assist to steady for 4-5 minutes with support of RW                             Pertinent Vitals/Pain Remained on 2L O2 , no c/o of pain    Home Living Family/patient expects to be discharged to:: Private residence Living Arrangements: Spouse/significant other Available Help at Discharge: Family (granddaughter comes during the day to assist) Type of Home: House       Home Layout: One level Home Equipment: Environmental consultantWalker - 2 wheels;Bedside commode      Prior Function Level of Independence: Needs assistance   Gait / Transfers Assistance Needed: granddaughter states pt normally household ambulator with RW however since progressive weakness has been in bed a lot [facility]  ADL's / Homemaking Assistance Needed: difficult to get accurate history from pt but he indicates he has needed assist with bathing and dressing for several years. He has help when he gets up by his grand daughter during the day while wife is at work. Not sure from pt how long he has needed assist with ambulation.        Hand Dominance  Extremity/Trunk Assessment   Upper Extremity Assessment: RUE deficits/detail RUE Deficits / Details: only able to raise arm to about 90 degree shoulder flexion; generalized weakness bilateral UE. Raises L UE to [facility] for shoulder flexion but weak. Note resting tremors.         Lower Extremity Assessment: Generalized weakness (grossly at least 3/5 per functional observation)      Cervical / Trunk Assessment: Kyphotic  Communication   Communication: No difficulties  Cognition Arousal/Alertness: Awake/alert Behavior During Therapy: WFL for tasks  assessed/performed Overall Cognitive Status: Within Functional Limits for tasks assessed (appears to be his baseline)                      General Comments General comments (skin integrity, edema, etc.): sitting EOB X 2 minutes with min guard assist.    Exercises        Assessment/Plan    PT Assessment Patient needs continued PT services  PT Diagnosis Difficulty walking;Generalized weakness   PT Problem List Decreased activity tolerance;Decreased mobility;Decreased knowledge of use of DME;Decreased strength;Decreased balance  PT Treatment Interventions Gait training;DME instruction;Neuromuscular re-education;Balance training;Functional mobility training;Therapeutic activities;Therapeutic exercise;Patient/family education   PT Goals (Current goals can be found in the Care Plan section) Acute Rehab PT Goals Patient Stated Goal: agreeable to PT and progressing mobility PT Goal Formulation: With patient Time For Goal Achievement: 08/12/13 Potential to Achieve Goals: Good    Frequency Min 3X/week   Barriers to discharge        Co-evaluation               End of Session Equipment Utilized During Treatment: Gait belt;Oxygen Activity Tolerance: Patient limited by fatigue Patient left: in chair;with call bell/phone within reach;with family/visitor present;with nursing/sitter in room;with chair alarm set           Time: [id number] PT Time Calculation (min): 15 min   Charges:   PT Evaluation $Initial PT Evaluation Tier I: 1 Procedure PT Treatments $Therapeutic Activity: 8-22 mins   PT G CodesLynnell Catalan:          Samarie Pinder E Shawnette Augello 08/05/2013, 11:53 AM Zenovia JarredKati Novia Lansberry, PT, DPT 08/05/2013 Pager: [phone]

## 2013-08-05 NOTE — Progress Notes (Signed)
Resumed care of patient.  Pt resting comfortably.  No change in patient assessment.  Will continue to monitor.

## 2013-08-06 LAB — GLUCOSE, CAPILLARY
GLUCOSE-CAPILLARY: 58 mg/dL — AB (ref 70–99)
GLUCOSE-CAPILLARY: 68 mg/dL — AB (ref 70–99)
GLUCOSE-CAPILLARY: 90 mg/dL (ref 70–99)
Glucose-Capillary: 95 mg/dL (ref 70–99)
Glucose-Capillary: 93 mg/dL (ref 70–99)
Glucose-Capillary: 201 mg/dL — ABNORMAL HIGH (ref 70–99)

## 2013-08-06 LAB — BASIC METABOLIC PANEL
BUN: 18 mg/dL (ref 6–23)
CHLORIDE: 97 meq/L (ref 96–112)
CO2: 24 mEq/L (ref 19–32)
CREATININE: 1.2 mg/dL (ref 0.50–1.35)
Calcium: 7.8 mg/dL — ABNORMAL LOW (ref 8.4–10.5)
GFR, EST AFRICAN AMERICAN: 66 mL/min — AB (ref >90)
GFR, EST NON AFRICAN AMERICAN: 57 mL/min — AB (ref >90)
Glucose, Bld: 62 mg/dL — ABNORMAL LOW (ref 70–99)
POTASSIUM: 3.3 meq/L — AB (ref 3.7–5.3)
Sodium: 131 mEq/L — ABNORMAL LOW (ref 137–147)

## 2013-08-06 LAB — URINE CULTURE
CULTURE: NO GROWTH
Colony Count: NO GROWTH

## 2013-08-06 MED ORDER — POTASSIUM CHLORIDE CRYS ER 20 MEQ PO TBCR
40.0000 meq | EXTENDED_RELEASE_TABLET | Freq: Once | ORAL | Status: AC
  Administered 2013-08-06: 40 meq via ORAL
  Filled 2013-08-06: qty 2

## 2013-08-06 NOTE — Progress Notes (Signed)
Subjective: Looks comfortable.  Had a low blood sugar this AM, responded to orange juice.  Didn't eat much else for breakfast.  Objective: Vital signs in last 24 hours: Temp:  [97.8 F (36.6 C)-98.9 F (37.2 C)] 97.8 F (36.6 C) (05/09 0550) Pulse Rate:  [80-94] 94 (05/09 0550) Resp:  [17-19] 19 (05/09 0550) BP: (108-113)/(53-78) 113/57 mmHg (05/09 0550) SpO2:  [96 %-100 %] 96 % (05/09 0550) Weight change:  Last BM Date: 08/05/13  Intake/Output from previous day: 05/08 0701 - 05/09 0700 In: 2325 [P.O.:600; I.V.:1425; IV Piggyback:300] Out: 4225 [Urine:4225] Intake/Output this shift:    General appearance: alert, cooperative and appears stated age Neck: no adenopathy, no carotid bruit, no JVD, supple, symmetrical, trachea midline and thyroid not enlarged, symmetric, no tenderness/mass/nodules Resp: diminished breath sounds bibasilar Cardio: regular rate and rhythm, S1, S2 normal, no murmur, click, rub or gallop GI: soft, non-tender; bowel sounds normal; no masses,  no organomegaly Extremities: extremities normal, atraumatic, no cyanosis or edema  Lab Results:  Recent Labs  08/04/13 1926  WBC 4.1  HGB 10.7*  HCT 30.7*  PLT 198   BMET  Recent Labs  08/04/13 1926 08/06/13 0536  NA 123* 131*  K 3.5* 3.3*  CL 85* 97  CO2 24 24  GLUCOSE 96 62*  BUN 27* 18  CREATININE 1.28 1.20  CALCIUM 8.3* 7.8*    Studies/Results: No results found.  Medications:  I have reviewed the patient's current medications. Scheduled: . allopurinol  300 mg Oral Daily  . azithromycin  500 mg Intravenous Q24H  . carbidopa-levodopa  0.5 tablet Oral TID  . carvedilol  6.25 mg Oral BID  . cefTRIAXone (ROCEPHIN)  IV  1 g Intravenous Q24H  . clopidogrel  75 mg Oral Q breakfast  . enoxaparin (LOVENOX) injection  40 mg Subcutaneous Q24H  . insulin aspart  0-15 Units Subcutaneous TID WC  . losartan  100 mg Oral Daily  . metFORMIN  500 mg Oral BID WC  . pantoprazole  40 mg Oral Daily  .  potassium chloride  10 mEq Oral Daily  . QUEtiapine  50 mg Oral TID  . sertraline  25 mg Oral Daily  . simvastatin  20 mg Oral q1800   Continuous: . sodium chloride 75 mL/hr at 08/06/13 0008   ZOX:[id number]:acetaminophen, acetaminophen, ondansetron (ZOFRAN) IV, polyethylene glycol  Assessment/Plan: #1 community-acquired pneumonia right lower lobe by outside chest x-ray done it tried imaging confirmed clinically failed outpatient regimen currently on IV antibiotics   Continue Levaquin.  Clinical improvement. #2 hyponatremia:  Up to 131 today.  Continue to hold diuretics. #3 Hypokalemia:  Replace orally today. 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

## 2013-08-07 LAB — BASIC METABOLIC PANEL
BUN: 14 mg/dL (ref 6–23)
CALCIUM: 7.9 mg/dL — AB (ref 8.4–10.5)
CO2: 24 mEq/L (ref 19–32)
Chloride: 99 mEq/L (ref 96–112)
Creatinine, Ser: 1.11 mg/dL (ref 0.50–1.35)
GFR, EST AFRICAN AMERICAN: 72 mL/min — AB (ref >90)
GFR, EST NON AFRICAN AMERICAN: 62 mL/min — AB (ref >90)
GLUCOSE: 79 mg/dL (ref 70–99)
POTASSIUM: 3.3 meq/L — AB (ref 3.7–5.3)
Sodium: 133 mEq/L — ABNORMAL LOW (ref 137–147)

## 2013-08-07 LAB — GLUCOSE, CAPILLARY
Glucose-Capillary: 88 mg/dL (ref 70–99)
Glucose-Capillary: 148 mg/dL — ABNORMAL HIGH (ref 70–99)
Glucose-Capillary: 214 mg/dL — ABNORMAL HIGH (ref 70–99)
Glucose-Capillary: 195 mg/dL — ABNORMAL HIGH (ref 70–99)

## 2013-08-07 MED ORDER — POTASSIUM CHLORIDE CRYS ER 20 MEQ PO TBCR
40.0000 meq | EXTENDED_RELEASE_TABLET | Freq: Once | ORAL | Status: AC
  Administered 2013-08-07: 40 meq via ORAL
  Filled 2013-08-07: qty 2

## 2013-08-07 NOTE — Plan of Care (Signed)
Problem: Phase I Progression Outcomes Goal: OOB as tolerated unless otherwise ordered Outcome: Completed/Met Date Met:  08/07/13 Patient OOB in chair using steady equipment. Patient tolerated well.

## 2013-08-07 NOTE — Progress Notes (Signed)
Subjective: Uneventful 24 hours.  Has not had a significant BM but is on enteric precautions, which will be d/ced as C. Dif cannot be collected.  Also, nursing noting a bit more coughing with feeds.  He has dry bacon and biscuits in front of him this AM.  Objective: Vital signs in last 24 hours: Temp:  [98.1 F (36.7 C)-98.4 F (36.9 C)] 98.4 F (36.9 C) (05/10 0527) Pulse Rate:  [78-87] 78 (05/10 0527) Resp:  [18] 18 (05/10 0527) BP: (110-128)/(62-68) 128/62 mmHg (05/10 0527) SpO2:  [95 %-97 %] 95 % (05/10 0527) Weight change:  Last BM Date: 08/06/13  Intake/Output from previous day: 05/09 0701 - 05/10 0700 In: 1800 [P.O.:600; I.V.:900; IV Piggyback:300] Out: 800 [Urine:800] Intake/Output this shift:   General appearance: alert, cooperative and appears stated age  Neck: no adenopathy, no carotid bruit, no JVD, supple, symmetrical, trachea midline and thyroid not enlarged, symmetric, no tenderness/mass/nodules  Resp: diminished breath sounds bibasilar  Cardio: regular rate and rhythm, S1, S2 normal, no murmur, click, rub or gallop  GI: soft, non-tender; bowel sounds normal; no masses, no organomegaly  Extremities: extremities normal, atraumatic, no cyanosis or edema  Lab Results:  Recent Labs  08/04/13 1926  WBC 4.1  HGB 10.7*  HCT 30.7*  PLT 198   BMET  Recent Labs  08/06/13 0536 08/07/13 0513  NA 131* 133*  K 3.3* 3.3*  CL 97 99  CO2 24 24  GLUCOSE 62* 79  BUN 18 14  CREATININE 1.20 1.11  CALCIUM 7.8* 7.9*    Studies/Results: No results found.  Medications:  I have reviewed the patient's current medications. Scheduled: . allopurinol  300 mg Oral Daily  . azithromycin  500 mg Intravenous Q24H  . carbidopa-levodopa  0.5 tablet Oral TID  . carvedilol  6.25 mg Oral BID  . cefTRIAXone (ROCEPHIN)  IV  1 g Intravenous Q24H  . clopidogrel  75 mg Oral Q breakfast  . enoxaparin (LOVENOX) injection  40 mg Subcutaneous Q24H  . insulin aspart  0-15 Units  Subcutaneous TID WC  . losartan  100 mg Oral Daily  . metFORMIN  500 mg Oral BID WC  . pantoprazole  40 mg Oral Daily  . potassium chloride  10 mEq Oral Daily  . potassium chloride  40 mEq Oral Once  . QUEtiapine  50 mg Oral TID  . sertraline  25 mg Oral Daily  . simvastatin  20 mg Oral q1800   Continuous: . sodium chloride 75 mL/hr at 08/07/13 [id number]   RUE:[id number]:acetaminophen, acetaminophen, ondansetron (ZOFRAN) IV, polyethylene glycol  Assessment/Plan: #1 community-acquired pneumonia right lower lobe by outside chest x-ray done it tried imaging confirmed clinically failed outpatient regimen currently on IV antibiotics Continue Levaquin. Clinical improvement.  #2 hyponatremia: Up to 133 today. Continue to hold diuretics.  #3 Hypokalemia: Replace orally today again.  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   LOS: 3 days   Adelfa KohRichard W [facility], LLCisovec 08/07/2013, 9:41 AM

## 2013-08-08 ENCOUNTER — Inpatient Hospital Stay (HOSPITAL_COMMUNITY): Payer: Medicare Other

## 2013-08-08 LAB — BLOOD GAS, ARTERIAL
Acid-base deficit: 2.1 mmol/L — ABNORMAL HIGH (ref 0.0–2.0)
BICARBONATE: 21.4 meq/L (ref 20.0–24.0)
Drawn by: 308601
FIO2: 0.21 %
O2 SAT: 99.7 %
PH ART: 7.414 (ref 7.350–7.450)
Patient temperature: 98.6
TCO2: 19.8 mmol/L (ref 0–100)
pCO2 arterial: 34.1 mmHg — ABNORMAL LOW (ref 35.0–45.0)
pO2, Arterial: 175 mmHg — ABNORMAL HIGH (ref 80.0–100.0)

## 2013-08-08 LAB — BASIC METABOLIC PANEL
BUN: 13 mg/dL (ref 6–23)
CHLORIDE: 101 meq/L (ref 96–112)
CO2: 22 mEq/L (ref 19–32)
Calcium: 8 mg/dL — ABNORMAL LOW (ref 8.4–10.5)
Creatinine, Ser: 1.06 mg/dL (ref 0.50–1.35)
GFR calc Af Amer: 76 mL/min — ABNORMAL LOW (ref >90)
GFR, EST NON AFRICAN AMERICAN: 66 mL/min — AB (ref >90)
Glucose, Bld: 121 mg/dL — ABNORMAL HIGH (ref 70–99)
POTASSIUM: 3.8 meq/L (ref 3.7–5.3)
SODIUM: 134 meq/L — AB (ref 137–147)

## 2013-08-08 LAB — GLUCOSE, CAPILLARY
GLUCOSE-CAPILLARY: 117 mg/dL — AB (ref 70–99)
Glucose-Capillary: 163 mg/dL — ABNORMAL HIGH (ref 70–99)
Glucose-Capillary: 129 mg/dL — ABNORMAL HIGH (ref 70–99)
Glucose-Capillary: 168 mg/dL — ABNORMAL HIGH (ref 70–99)

## 2013-08-08 MED ORDER — LEVOFLOXACIN 500 MG PO TABS
500.0000 mg | ORAL_TABLET | Freq: Every day | ORAL | Status: DC
  Administered 2013-08-08 – 2013-08-09 (×2): 500 mg via ORAL
  Filled 2013-08-08 (×2): qty 1

## 2013-08-08 MED ORDER — POTASSIUM CHLORIDE 20 MEQ/15ML (10%) PO LIQD
20.0000 meq | Freq: Every day | ORAL | Status: DC
  Administered 2013-08-08 – 2013-08-09 (×2): 20 meq via ORAL
  Filled 2013-08-08 (×2): qty 15

## 2013-08-08 MED ORDER — ALBUTEROL SULFATE (2.5 MG/3ML) 0.083% IN NEBU
2.5000 mg | INHALATION_SOLUTION | Freq: Once | RESPIRATORY_TRACT | Status: AC
  Administered 2013-08-08: 2.5 mg via RESPIRATORY_TRACT

## 2013-08-08 MED ORDER — FUROSEMIDE 40 MG PO TABS
40.0000 mg | ORAL_TABLET | Freq: Every day | ORAL | Status: DC
  Administered 2013-08-08 – 2013-08-09 (×2): 40 mg via ORAL
  Filled 2013-08-08 (×2): qty 1

## 2013-08-08 MED ORDER — ALBUTEROL SULFATE (2.5 MG/3ML) 0.083% IN NEBU
2.5000 mg | INHALATION_SOLUTION | RESPIRATORY_TRACT | Status: DC | PRN
  Administered 2013-08-08: 2.5 mg via RESPIRATORY_TRACT
  Filled 2013-08-08 (×2): qty 3

## 2013-08-08 MED ORDER — FUROSEMIDE 10 MG/ML IJ SOLN
40.0000 mg | Freq: Once | INTRAMUSCULAR | Status: AC
  Administered 2013-08-08: 40 mg via INTRAVENOUS
  Filled 2013-08-08: qty 4

## 2013-08-08 NOTE — Progress Notes (Signed)
Physical Therapy Treatment Patient Details Name: Terrance SabalStephen R Perez MRN: [id number] DOB: 1934-02-22 Today's Date: 08/08/2013    History of Present Illness Pt is a 80 year old male admitted on 08/04/13 with weakness and pnuemonia.  PMHx CVD, Parkinsonism, HTN, CAD, tremor, glaucoma.    PT Comments    [facility] and [facility].  Pt with increased work of breathing today requiring frequent breaks and vitals assessed (see below).  Follow Up Recommendations  SNF;Supervision for mobility/OOB     Equipment Recommendations  None recommended by PT (w/c per CSW note)    Recommendations for Other Services       Precautions / Restrictions Precautions Precautions: Fall Precaution Comments: history of Parkinsonism Restrictions Weight Bearing Restrictions: No    Mobility  Bed Mobility Overal bed mobility: +2 for physical assistance;+ 2 for safety/equipment;Needs Assistance Bed Mobility: Supine to Sit     Supine to sit: Max assist;+2 for physical assistance     General bed mobility comments: required increased cues and assist to EOB, utilized bed pad to scoot hips  Transfers Overall transfer level: Needs assistance Equipment used: Rolling walker (2 wheeled) Transfers: Sit to/from U[employer] CorporationStand;Stand Pivot Transfers Sit to Stand: Mod assist;+2 safety/equipment Stand pivot transfers: Mod assist;+2 safety/equipment       General transfer comment: verbal cues for safe technique, pt performed 2 sit to stands prior to [facility] (felt as though his bowels were moving and has been having very loose stool), pt [facility]ig[facility] and put recliner behind pt  Ambulation/Gait                 Stairs            Wheelchair Mobility    Modified Rankin (Stroke Patients Only)       Balance                                    Cognition Arousal/Alertness: Awake/alert Behavior During Therapy: WFL for tasks  assessed/performed Overall Cognitive Status: Within Functional Limits for tasks assessed                      Exercises      General Comments        Pertinent Vitals/Pain Upon sitting EOB: SpO2 96% room air, 88 bpm, 146/103 mmHg however retaken just after and 155/88 mmHg End of session: SpO2 96% room air, 92 bpm, 145/96 mmHg    Home Living                      Prior Function            PT Goals (current goals can now be found in the care plan section) Progress towards PT goals: Progressing toward goals    Frequency  Min 3X/week    PT Plan Current plan remains appropriate    Co-evaluation             End of Session Equipment Utilized During Treatment: Gait belt;Oxygen Activity Tolerance: Patient limited by fatigue Patient left: in chair;with call bell/phone within reach;with chair alarm set     Time: [id number] PT Time Calculation (min): 32 min  Charges:  $Therapeutic Activity: 23-37 mins                    G Codes:      Natalia LeatherwoodKatherine  E Yanelis Osika 08/08/2013, 12:38 PM Zenovia JarredKati Kennen Stammer, PT, DPT 08/08/2013 Pager: [phone]

## 2013-08-08 NOTE — Progress Notes (Signed)
No change in pt's respiratory status noted after nebulizer treatment was given. RR remains 30s-40s and O2 sat WNL on RA. Pt wheezing audibly with labored breathing. Dr. Jacky KindleAronson notified. Order received for additional neb treatment, IV lasix, and blood gas. Blood gas results abnormal. After dose of lasix and second neb treatment pt breathing is no longer labored and breath sounds noted to improve. Dr. Jacky KindleAronson notified of blood gas results and pt's condition. Dr. Jacky KindleAronson to bedside to assess pt. Report given to oncoming RN, who will continue to monitor.

## 2013-08-08 NOTE — Progress Notes (Signed)
Occupational Therapy Treatment Patient Details Name: Terrance Perez Terrance Perez MRN: [id number] DOB: 1934-01-17 Today's Date: 08/08/2013    History of present illness Pt is a 80 year old male admitted on 08/04/13 with weakness and pnuemonia.  PMHx CVD, Parkinsonism, HTN, CAD, tremor, glaucoma.   OT comments  Pt more fatigued today and states he didn't sleep well last night. Pt willing to work on grooming at edge of chair and then had to stand for periarea cleanup. Worked on Lucent Technologieseducaton of safety with hand placement and LE placement for safety with transitions to standing/sitting.   Follow Up Recommendations  Supervision/Assistance - 24 hour;SNF    Equipment Recommendations  None recommended by OT    Recommendations for Other Services      Precautions / Restrictions Precautions Precautions: Fall Precaution Comments: history of Parkinsonism       Mobility Bed Mobility                  Transfers Overall transfer level: Needs assistance Equipment used: Rolling walker (2 wheeled) Transfers: Sit to/from Stand Sit to Stand: Mod assist         General transfer comment: verbal cues for hand placement and keeping feet under him more.    Balance                                   ADL       Grooming: Wash/dry face;Min guard;Sitting Grooming Details (indicate cue type and reason): for safety. pt forward flexed sitting at edge of chair. verbal and tactile cues for more upright posture but pt tends to flex forward again. pt reports feeling fatigued and didnt sleep well last night.                 Toilet Transfer: Moderate assistance;RW Toilet Transfer Details (indicate cue type and reason): sit to stand. Toileting- Clothing Manipulation and Hygiene: +2 for safety/equipment;+2 for physical assistance;Total assistance;Sit to/from stand         General ADL Comments: pt reports feeling very fatigued and states he didnt sleep well last night. he was agreeable to  sit at edge of chair and work on grooming today. He tends to be forward flexed in sitting and gave pt cues to try to sit more upright. He can correct but then tends to forward flex again. While sitting edge of chair, pt with some clear mucous type fluid coming from bottom. Nursing made aware and tech in to assist with clean up. He is also very forward  in standing over walker.  Mod verbal cues for hand placement, pt tends to pull up on walker. He also needs cues for placing his feet under him more.      Vision                     Perception     Praxis      Cognition   Behavior During Therapy: [facility]WFL for tasks assessed/performed Overall Cognitive Status: Within Functional Limits for tasks assessed                       Extremity/Trunk Assessment               Exercises     Shoulder Instructions       General Comments      Pertinent Vitals/ Pain       No complaint of  Home Living                                          Prior Functioning/Environment              Frequency Min 2X/week     Progress Toward Goals  OT Goals(current goals can now be found in the care plan section)  Progress towards OT goals:  (pt more fatigued today.)     Plan Discharge plan remains appropriate    Co-evaluation                 End of Session Equipment Utilized During Treatment: Rolling walker   Activity Tolerance Patient limited by fatigue   Patient Left in chair;with call bell/phone within reach;with chair alarm set   Nurse Communication          Time: [id number] OT Time Calculation (min): 27 min  Charges: OT General Charges $OT Visit: 1 Procedure OT Treatments $Self Care/Home Management : 8-22 mins $Therapeutic Activity: 8-22 mins  Terrance Perez Terrance Perez [phone] 08/08/2013, 11:32 AM

## 2013-08-08 NOTE — Progress Notes (Signed)
Subjective: Difficult night with increased work of breathing.  cxray noted-good respopnse to lasix--clinically better, baseline  Objective: Vital signs in last 24 hours: Temp:  [98 F (36.7 C)-98.2 F (36.8 C)] 98 F (36.7 C) (05/11 0427) Pulse Rate:  [84-90] 85 (05/11 0650) Resp:  [24-41] 32 (05/11 0650) BP: (117-143)/(61-78) 143/73 mmHg (05/11 0650) SpO2:  [96 %-98 %] 97 % (05/11 0650) Weight change:   CBG (last 3)   Recent Labs  08/07/13 1656 08/07/13 2112 08/08/13 0731  GLUCAP 214* 195* 117*    Intake/Output from previous day: 05/10 0701 - 05/11 0700 In: 2220 [P.O.:720; I.V.:1200; IV Piggyback:300] Out: 1300 [Urine:1300]  Physical Exam:  General appearance: alert, cooperative and appears stated age -breathing easy, no distress Neck: no adenopathy, no carotid bruit, no JVD, supple, symmetrical, trachea midline and thyroid not enlarged, symmetric, no tenderness/mass/nodules  Resp: diminished breath sounds bibasilar  Cardio: regular rate and rhythm, S1, S2 normal, no murmur, click, rub or gallop  GI: soft, non-tender; bowel sounds normal; no masses, no organomegaly  Extremities: extremities normal, atraumatic, no cyanosis or edema Neuro- moex4, drowsy this am, will converse  Lab Results:  Recent Labs  08/07/13 0513 08/08/13 0355  NA 133* 134*  K 3.3* 3.8  CL 99 101  CO2 24 22  GLUCOSE 79 121*  BUN 14 13  CREATININE 1.11 1.06  CALCIUM 7.9* 8.0*   No results found for this basename: AST, ALT, ALKPHOS, BILITOT, PROT, ALBUMIN,  in the last 72 hours No results found for this basename: WBC, NEUTROABS, HGB, HCT, MCV, PLT,  in the last 72 hours Lab Results  Component Value Date   INR 1.8* 09/03/2006   INR 1.1 08/30/2006   No results found for this basename: CKTOTAL, CKMB, CKMBINDEX, TROPONINI,  in the last 72 hours No results found for this basename: TSH, T4TOTAL, FREET3, T3FREE, THYROIDAB,  in the last 72 hours No results found for this basename: VITAMINB12,  FOLATE, FERRITIN, TIBC, IRON, RETICCTPCT,  in the last 72 hours  Studies/Results: Dg Chest Port 1 View  08/08/2013   CLINICAL DATA:  Cough and congestion with increasing shortness of breath.  EXAM: PORTABLE CHEST - 1 VIEW  COMPARISON:  DG CHEST 2 VIEW dated 04/02/2010  FINDINGS: Postoperative changes in the mediastinum. Mild cardiac enlargement and pulmonary vascular congestion. Appearance is likely similar to prior study, lying differences in technique. No focal consolidation. No blunting of costophrenic angles. Calcified and tortuous aorta.  IMPRESSION: Shallow inspiration. Cardiac enlargement with mild pulmonary vascular congestion. No edema or consolidation.   Electronically Signed   By: Burman NievesWilliam  Stevens M.D.   On: 08/08/2013 05:25     Assessment/Plan: #1 community-acquired pneumonia right lower lobe by outside chest x-ray done it tried imaging confirmed clinically failed outpatient regimen currently on IV antibiotics Continue Levaquin. Clinical improvement.  #2 hyponatremia: Up to 133 today. Continue to hold diuretics.  #3 Hypokalemia: Replace orally today again.  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

## 2013-08-08 NOTE — Progress Notes (Signed)
CSW spoke with patient's wife, Corrie DandyMary (work#: [phone]) re: discharge planning. Wife is now agreeable with plan for SNF. [facility] - anticipating discharge tomorrow. Wife to complete [facility] when she gets off work this afternoon.  Lincoln MaxinKelly Cortlin Marano, LCSW [facility] Clinical Social Worker cell #: [phone]

## 2013-08-08 NOTE — Progress Notes (Signed)
Pt noted to have increased RR of 41 with expiratory wheezes, O2 sat 97% on RA and all other VS WNL. Also noted to be coughing whenever the pt drinks. Dr. Jacky KindleAronson notified and order received for STAT chest xray and nebulizer treatments Q2hPRN. Pt received nebulizer tx. Will continue to monitor.

## 2013-08-09 LAB — BASIC METABOLIC PANEL
BUN: 15 mg/dL (ref 6–23)
CALCIUM: 8.3 mg/dL — AB (ref 8.4–10.5)
CO2: 25 meq/L (ref 19–32)
CREATININE: 1.16 mg/dL (ref 0.50–1.35)
Chloride: 99 mEq/L (ref 96–112)
GFR calc Af Amer: 68 mL/min — ABNORMAL LOW (ref >90)
GFR calc non Af Amer: 59 mL/min — ABNORMAL LOW (ref >90)
GLUCOSE: 112 mg/dL — AB (ref 70–99)
Potassium: 3.4 mEq/L — ABNORMAL LOW (ref 3.7–5.3)
Sodium: 136 mEq/L — ABNORMAL LOW (ref 137–147)

## 2013-08-09 LAB — GLUCOSE, CAPILLARY
Glucose-Capillary: 117 mg/dL — ABNORMAL HIGH (ref 70–99)
Glucose-Capillary: 176 mg/dL — ABNORMAL HIGH (ref 70–99)

## 2013-08-09 MED ORDER — LEVOFLOXACIN 500 MG PO TABS: 500.0000 mg | ORAL_TABLET | Freq: Every day | ORAL | Status: DC

## 2013-08-09 MED ORDER — METFORMIN HCL 500 MG PO TABS: 500.0000 mg | ORAL_TABLET | Freq: Two times a day (BID) | ORAL | Status: DC

## 2013-08-09 MED ORDER — INSULIN ASPART 100 UNIT/ML ~~LOC~~ SOLN: 0.0000 [IU] | Freq: Three times a day (TID) | SUBCUTANEOUS | Status: DC

## 2013-08-09 NOTE — Telephone Encounter (Signed)
Follow up      Wanted the nurse to know patient was admit to hospital for 5 days with pneumonia . [facility].

## 2013-08-09 NOTE — Care Management Note (Signed)
    Page 1 of 1   08/09/2013     11:44:15 AM CARE MANAGEMENT NOTE 08/09/2013  Patient:  Allyson SabalJOHNSON,Breyer R   Account Number:  [id number]  Date Initiated:  08/07/2013  Documentation initiated by:  [facility]MIRINGU,RUTH  Subjective/Objective Assessment:   80 year old male admitted with pneumonia.     Action/Plan:   From home with spouse. Possible SNF at d/c   Anticipated DC Date:  08/09/2013   Anticipated DC Plan:  SKILLED NURSING FACILITY  In-house referral  Clinical Social Worker      DC Planning Services  CM consult      Choice offered to / List presented to:             Status of service:  Completed, signed off Medicare Important Message given?  YES (If response is "NO", the following Medicare IM given date fields will be blank) Date Medicare IM given:  08/08/2013 Date Additional Medicare IM given:    Discharge Disposition:  SKILLED NURSING FACILITY  Per UR Regulation:  Reviewed for med. necessity/level of care/duration of stay  If discussed at Long Length of Stay Meetings, dates discussed:   08/09/2013    Comments:  08/09/13 Ladell Lea RN,BSN NCM [phone] D/C SNF.  08/08/13 Lezette Kitts RN,BSN NCM [phone] PATIENT/SPOUSE AGREE TO SNF.CSW FOLLOWING.

## 2013-08-09 NOTE — Discharge Summary (Signed)
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     ALPH-E 400 UNIT capsule  Generic drug:  vitamin E  Take 400 Units by mouth 2 (two) times daily.     brimonidine 0.1 % Soln  Commonly known as:  ALPHAGAN P  Place 1 drop into both eyes 2 (two) times daily.     carbidopa-levodopa 25-100 MG per tablet  Commonly known as:  SINEMET IR  Take 0.5 tablets by mouth 3 (three) times daily.     carvedilol 12.5 MG tablet  Commonly known as:  COREG  Take 0.5 tablets (6.25 mg total) by mouth 2 (two) times daily.     clopidogrel 75 MG tablet  Commonly known as:  PLAVIX  Take 75 mg by mouth daily with breakfast.     dorzolamide-timolol 22.3-6.8 MG/ML ophthalmic solution  Commonly known as:  COSOPT  Place 1 drop into both eyes 2 (two) times daily.     furosemide 40 MG tablet  Commonly known as:  LASIX  Take 40 mg by mouth daily.     insulin aspart 100 UNIT/ML injection  Commonly known as:  novoLOG  Inject 0-15 Units into the skin 3 (three) times daily with meals.     levofloxacin 500 MG tablet  Commonly known as:  LEVAQUIN  Take 1 tablet (500 mg  total) by mouth daily.     losartan 100 MG tablet  Commonly known as:  COZAAR  Take 1 tablet (100 mg total) by mouth daily.     metFORMIN 500 MG tablet  Commonly known as:  GLUCOPHAGE  Take 1 tablet (500 mg total) by mouth 2 (two) times daily with a meal.     pantoprazole 40 MG tablet  Commonly known as:  PROTONIX  Take 40 mg by mouth daily.     polyethylene glycol packet  Commonly known as:  MIRALAX / GLYCOLAX  Take 17 g by mouth daily as needed. For constipation     potassium chloride 20 MEQ/15ML (10%) solution  TAKE 15 MLS (20 MEQ TOTAL) BY MOUTH 3 (THREE) TIMES DAILY.  Patient is taking 3 tablespoons a day.     QUEtiapine 50 MG tablet  Commonly known as:  SEROQUEL  Take 50 mg by mouth 3 (three) times daily.     sertraline 25 MG tablet  Commonly known as:  ZOLOFT  Take 25 mg by mouth at bedtime.     simvastatin 20 MG tablet  Commonly known as:  ZOCOR  Take 20 mg by mouth daily at 6 PM.     travoprost (benzalkonium) 0.004 % ophthalmic  solution  Commonly known as:  TRAVATAN  Place 1 drop into both eyes at bedtime.        Hospital Procedures: Dg Chest Port 1 View  08/08/2013   CLINICAL DATA:  Cough and congestion with increasing shortness of breath.  EXAM: PORTABLE CHEST - 1 VIEW  COMPARISON:  DG CHEST 2 VIEW dated 04/02/2010  FINDINGS: Postoperative changes in the mediastinum. Mild cardiac enlargement and pulmonary vascular congestion. Appearance is likely similar to prior study, lying differences in technique. No focal consolidation. No blunting of costophrenic angles. Calcified and tortuous aorta.  IMPRESSION: Shallow inspiration. Cardiac enlargement with mild pulmonary vascular congestion. No edema or consolidation.   Electronically Signed   By: Burman NievesWilliam  Stevens M.D.   On: 08/08/2013 05:25    History of Present Illness:  80 yo wm with multiple chronic illnesses listed below admitted with RLL pneumonia, poor po intake, low blood sugars and progressive weakness. Seen in  office with acute illness and cxray performed [facility] confirmed rll pneumonia. At that time, nontoxic and started on po fluids as well as levaquin. No fever and resp status no worse but progressively weak with very little po intake. At this point barely able to hold head up and unable to transfer/stand. No chestpain, no n/v, no diarrhea and no gu sxs. Admitted for ivf and iv antibiotics.     Hospital Course: Patient was admitted after failing outpatient regimen initially of Levaquin with progressive weakness, hypoglycemia, cough and congestion. Pneumonia was confirmed outside film and was treated empirically with a combination of Rocephin and Zithromax. This ultimately cleared and he was transition to oral Levaquin to complete his course. Prior to discharge from a pulmonary standpoint lung fields much clear with few scattered rhonchi but no distress. From a metabolic standpoint he was a bit dehydrated, hypoglycemic such that oral medications were altered with regards to the diabetes, patient was hydrated, and from a metabolic standpoint he stabilized. He did have a brief course of nausea vomiting and diarrhea on the front end of this hospitalization but all of this result. Finally, following holding of his Lasix and hydration he did have a bout of decompensated congestive heart failure; this responded nicely to IV Lasix and ultimately resumption of his oral dose of Lasix. Given this extensive illness, he remains the condition with regards to his Parkinson's disease and unmanageable at home without an attempt at rehabilitation. He is transition to skilled nursing for stabilization of his medical issues and for OT and PT to return to the care of his wife.  Day of Discharge Exam BP 131/71  Pulse 96  Temp(Src) 98.2 F (36.8 C) (Oral)  Resp 18  Ht 5\' 7"  (1.702 m)  Wt 99.3 kg (218 lb 14.7 oz)  BMI 34.28 kg/m2  SpO2 95%  Physical Exam: General appearance: alert and cooperative Eyes: no scleral  icterus Throat: oropharynx moist without erythema Resp: clear to auscultation bilaterally Cardio: regular rate and rhythm Extremities: no clubbing, cyanosis or edema Patient will arouse his diffusely weak does recognize me has a parkinsonian face, tremor, bradykinesia. In addition he does have tardive dyskinesia from his long-standing use of antipsychotics for his schizophrenia.  Discharge Labs:  Recent Labs  08/08/13 0355 08/09/13 0433  NA 134* 136*  K 3.8 3.4*  CL 101 99  CO2 22 25  GLUCOSE 121* 112*  BUN 13 15  CREATININE 1.06 1.16  CALCIUM 8.0* 8.3*   No results found for this basename: AST, ALT, ALKPHOS, BILITOT, PROT,  ALBUMIN,  in the last 72 hours No results found for this basename: WBC, NEUTROABS, HGB, HCT, MCV, PLT,  in the last 72 hours No results found for this basename: CKTOTAL, CKMB, CKMBINDEX, TROPONINI,  in the last 72 hours No results found for this basename: TSH, T4TOTAL, FREET3, T3FREE, THYROIDAB,  in the last 72 hours No results found for this basename: VITAMINB12, FOLATE, FERRITIN, TIBC, IRON, RETICCTPCT,  in the last 72 hours  Discharge instructions:     Discharge Orders   Future Appointments Provider Department Dept Phone   08/12/2013 2:15 PM Peter M [place], MD [facility] Office [phone]   12/02/2013 3:30 PM York Spaniel, MD [facility] [phone]   Future Orders Complete By Expires   Diet - low sodium heart healthy  As directed    Increase activity slowly  As directed       Disposition: Skilled nursing as he is not at baseline unmanageable at home until he is more functional  Follow-up Appts: Follow-up with Dr. Jacky Kindle at [facility] in skilled nursing.  Call for appointment.  Condition on Discharge: Improved and stable condition  Tests Needing Follow-up: None  Signed: Minda Meo 08/09/2013, 10:55 AM

## 2013-08-09 NOTE — Progress Notes (Signed)
[facility] to [facility]. Patient, wife & grandaugther at bedside aware. [facility], ConcowJen aware. [facility] scheduled for 1:30 transport pickup (Service Request Id: [id number]).   Clinical Social Work Department CLINICAL SOCIAL WORK PLACEMENT NOTE 08/09/2013  Patient:  Terrance Perez,Terrance Perez  Account Number:  [id number] Admit date:  08/04/2013  Clinical Social Worker:  Orpah GreekKELLY FOLEY, LCSWA  Date/time:  08/05/2013 04:29 PM  Clinical Social Work is seeking post-discharge placement for this patient at the following level of care:   SKILLED NURSING   (*CSW will update this form in Epic as items are completed)   08/05/2013  Patient/family provided with [facility] Department of Clinical Social Work's list of facilities offering this level of care within the geographic area requested by the patient (or if unable, by the patient's family).  08/05/2013  Patient/family informed of their freedom to choose among providers that offer the needed level of care, that participate in Medicare, Medicaid or managed care program needed by the patient, have an available bed and are willing to accept the patient.  08/05/2013  Patient/family informed of [facility]' ownership interest in [facility], as well as of the fact that they are under no obligation to receive care at this facility.  PASARR submitted to EDS on 08/09/2013 PASARR number received from EDS on 08/09/2013  FL2 transmitted to all facilities in geographic area requested by pt/family on  08/05/2013 FL2 transmitted to all facilities within larger geographic area on   Patient informed that his/her managed care company has contracts with or will negotiate with  certain facilities, including the following:     Patient/family informed of bed offers received:  08/09/2013 [name] Physician recommends and patient chooses bed at    [facility] on  08/09/2013 Patient to be transferred to facility by [facility]  The following physician request were entered in Epic:   Additional Comments:   Terrance MaxinKelly Jaleiyah Alas, LCSW [facility] Clinical Social Worker cell #: [phone]

## 2013-08-09 NOTE — Progress Notes (Signed)
Attempted to  Call report, RN unable to receive report at this time. Left message to have [facility] call me back at [phone]. Transportation scheduled for pick up at 1:30. Patient ready for discharge. J.Alyia Lacerte, RN

## 2013-08-12 ENCOUNTER — Ambulatory Visit: Payer: Medicare Other | Admitting: Cardiology

## 2013-08-12 ENCOUNTER — Telehealth: Payer: Self-pay | Admitting: Cardiology

## 2013-08-12 NOTE — Telephone Encounter (Signed)
Follow up  ° ° ° °Returning call back to nurse  °

## 2013-08-12 NOTE — Telephone Encounter (Signed)
Returned call to patient's daughter Corrie DandyMary no answer.LMTC.

## 2013-08-12 NOTE — Telephone Encounter (Signed)
Returned call to patient's daughter [name] no answer.LMTC. 

## 2013-08-12 NOTE — Telephone Encounter (Signed)
New message     Returning Terrance Perez's call from yesterday

## 2013-08-16 NOTE — Telephone Encounter (Signed)
Spoke to patient's wife she stated husband recently in hospital with pneumonia.Stated [facility].Appointment scheduled with Dr.Jordan 09/16/13 at 4:30 pm.

## 2013-08-16 NOTE — Telephone Encounter (Signed)
See previous 08/16/13 note.

## 2013-08-16 NOTE — Telephone Encounter (Signed)
Returned call to patient's daughter no answer.LMTC. 

## 2013-08-19 ENCOUNTER — Ambulatory Visit: Payer: Medicare Other | Admitting: Podiatrist

## 2013-08-30 ENCOUNTER — Encounter: Payer: Self-pay | Admitting: Cardiology

## 2013-09-16 ENCOUNTER — Ambulatory Visit: Payer: Medicare Other | Admitting: Cardiology

## 2013-10-05 ENCOUNTER — Other Ambulatory Visit: Payer: Self-pay | Admitting: Cardiology

## 2013-10-07 ENCOUNTER — Encounter: Payer: Medicare Other | Admitting: Nurse Practitioner

## 2013-11-15 ENCOUNTER — Telehealth: Payer: Self-pay | Admitting: Cardiology

## 2013-11-15 NOTE — Telephone Encounter (Signed)
New information needed:      [facility] with [facility]field[facility]phan[facility] would like a call back on this pt's EF % and conformation of pt's CHF diagnose please.  Pt is enrolled in there heart frailer program.

## 2013-11-16 NOTE — Telephone Encounter (Signed)
Left requested information on [facility] nurse secured confidential voicemail.

## 2013-12-02 ENCOUNTER — Encounter: Payer: Self-pay | Admitting: Neurology

## 2013-12-02 ENCOUNTER — Ambulatory Visit (INDEPENDENT_AMBULATORY_CARE_PROVIDER_SITE_OTHER): Payer: Medicare Other | Admitting: Neurology

## 2013-12-02 VITALS — BP 99/61 | HR 76

## 2013-12-02 DIAGNOSIS — R269 Unspecified abnormalities of gait and mobility: Secondary | ICD-10-CM

## 2013-12-02 DIAGNOSIS — G20 Parkinson's disease: Secondary | ICD-10-CM

## 2013-12-02 MED ORDER — CARBIDOPA-LEVODOPA 25-100 MG PO TABS: 1.0000 | ORAL_TABLET | Freq: Three times a day (TID) | ORAL | Status: DC

## 2013-12-02 NOTE — Patient Instructions (Signed)
Parkinson Disease Parkinson disease is a disorder of the central nervous system, which includes the brain and spinal cord. A person with this disease slowly loses the ability to completely control body movements. Within the brain, there is a group of nerve cells (basal ganglia) that help control movement. The basal ganglia are damaged and do not work properly in a person with Parkinson disease. In addition, the basal ganglia produce and use a brain chemical called dopamine. The dopamine chemical sends messages to other parts of the body to control and coordinate body movements. Dopamine levels are low in a person with Parkinson disease. If the dopamine levels are low, then the body does not receive the correct messages it needs to move normally.  CAUSES  The exact reason why the basal ganglia get damaged is not known. Some medical researchers have thought that infection, genes, environment, and certain medicines may contribute to the cause.  SYMPTOMS   An early symptom of Parkinson disease is often an uncontrolled shaking (tremor) of the hands. The tremor will often disappear when the affected hand is consciously used.  As the disease progresses, walking, talking, getting out of a chair, and new movements become more difficult.  Muscles get stiff and movements become slower.  Balance and coordination become harder.  Depression, trouble swallowing, urinary problems, constipation, and sleep problems can occur.  Later in the disease, memory and thought processes may deteriorate. DIAGNOSIS  There are no specific tests to diagnose Parkinson disease. You may be referred to a neurologist for evaluation. Your caregiver will ask about your medical history, symptoms, and perform a physical exam. Blood tests and imaging tests of your brain may be performed to rule out other diseases. The imaging tests may include an MRI or a CT scan. TREATMENT  The goal of treatment is to relieve symptoms. Medicines may be  prescribed once the symptoms become troublesome. Medicine will not stop the progression of the disease, but medicine can make movement and balance better and help control tremors. Speech and occupational therapy may also be prescribed. Sometimes, surgical treatment of the brain can be done in young people. HOME CARE INSTRUCTIONS  Get regular exercise and rest periods during the day to help prevent exhaustion and depression.  If getting dressed becomes difficult, replace buttons and zippers with Velcro and elastic on your clothing.  Take all medicine as directed by your caregiver.  Install grab bars or railings in your home to prevent falls.  Go to speech or occupational therapy as directed.  Keep all follow-up visits as directed by your caregiver. SEEK MEDICAL CARE IF:  Your symptoms are not controlled with your medicine.  You fall.  You have trouble swallowing or choke on your food. MAKE SURE YOU:  Understand these instructions.  Will watch your condition.  Will get help right away if you are not doing well or get worse. Document Released: 03/14/2000 Document Revised: 07/12/2012 Document Reviewed: 04/16/2011 ExitCare Patient Information 2015 [employer]. This information is not intended to replace advice given to you by your health care provider. Make sure you discuss any questions you have with your health care provider.  

## 2013-12-02 NOTE — Progress Notes (Signed)
Reason for visit: Parkinson's disease  Terrance Perez is an 80 y.o. male  History of present illness:  Terrance Perez is a 80 year old left-handed white male with a history of Parkinson's disease associated with a significant gait disorder. The patient was recently in the hospital in May of 2015 with a pneumonia. The patient received physical therapy following this hospitalization, and he has had modification of his diet, as the pneumonia was felt secondary to aspiration. He is on thickened liquids. The patient has recently been increased on Zoloft taking 25 mg twice daily secondary to increased depression issues. The patient had some drowsiness on this dose increase. He is on Sinemet taking the 25/100 mg tablet, one half tablet 3 times daily. The patient walks with a walker, but he finds that his stamina has been reduced dramatically since the hospitalization for pneumonia. He has fallen recently, when he was transferring from the wheelchair to the toilet. He did not hurt himself. He returns for an evaluation.   Past Medical History  Diagnosis Date  . Coronary artery disease   . Diabetes mellitus   . Hypertension   . Hyperlipidemia   . CVD (cerebrovascular disease)   . Gout   . Glaucoma   . Paranoid schizophrenia   . Tremor   . History of orthostatic hypotension   . Dyspnea   . Diastolic dysfunction   . Fracture, humerus     right  . Parkinsonism 12/02/2012  . Abnormality of gait 12/02/2012  . Obesity     Past Surgical History  Procedure Laterality Date  . Cardiac catheterization  08/31/2006    MODERATE INFERIOR WALL HYPOKINESIA WITH EF 45%  . Coronary artery bypass graft  2008    LIMA GRAFT TO THE LAD, SAPHENOUS VEIN GRAFT TO THE DIAGONAL , SEQUENTIAL SAPHENOUS VEIN GRAFT TO THE PDA, POSTERIOR LATERAL , AND OBTUSE MARGINAL VESSELS.  . Tonsillectomy    . Asd repair    . Cholecystectomy      Family History  Problem Relation Age of Onset  . Diabetes Father   . Cystic  fibrosis Father   . Heart disease Father     Social history:  reports that he quit smoking about 55 years ago. He has never used smokeless tobacco. He reports that he does not drink alcohol or use illicit drugs.   No Known Allergies  Medications:  Current Outpatient Prescriptions on File Prior to Visit  Medication Sig Dispense Refill  . allopurinol (ZYLOPRIM) 300 MG tablet Take 300 mg by mouth every evening.       . brimonidine (ALPHAGAN P) 0.1 % SOLN Place 1 drop into both eyes 2 (two) times daily.      . carvedilol (COREG) 12.5 MG tablet Take 0.5 tablets (6.25 mg total) by mouth 2 (two) times daily.  30 tablet  6  . clopidogrel (PLAVIX) 75 MG tablet Take 75 mg by mouth daily with breakfast.      . dorzolamide-timolol (COSOPT) 22.3-6.8 MG/ML ophthalmic solution Place 1 drop into both eyes 2 (two) times daily.      . furosemide (LASIX) 40 MG tablet Take 40 mg by mouth daily.      . insulin aspart (NOVOLOG) 100 UNIT/ML injection Inject 0-15 Units into the skin 3 (three) times daily with meals.  10 mL  11  . levofloxacin (LEVAQUIN) 500 MG tablet Take 1 tablet (500 mg total) by mouth daily.  5 tablet  0  . losartan (COZAAR) 100 MG tablet Take  1 tablet (100 mg total) by mouth daily.  30 tablet  6  . metFORMIN (GLUCOPHAGE) 500 MG tablet Take 1 tablet (500 mg total) by mouth 2 (two) times daily with a meal.  60 tablet  5  . pantoprazole (PROTONIX) 40 MG tablet Take 40 mg by mouth daily.      . polyethylene glycol (MIRALAX / GLYCOLAX) packet Take 17 g by mouth daily as needed. For constipation      . potassium chloride 20 MEQ/15ML (10%) solution TAKE 15 MLS (20 MEQ TOTAL) BY MOUTH 3 (THREE) TIMES DAILY.  473 mL  3  . QUEtiapine (SEROQUEL) 50 MG tablet Take 50 mg by mouth 3 (three) times daily.       . sertraline (ZOLOFT) 25 MG tablet Take 25 mg by mouth 2 (two) times daily.       . simvastatin (ZOCOR) 20 MG tablet Take 20 mg by mouth daily at 6 PM.      . travoprost, benzalkonium, (TRAVATAN)  0.004 % ophthalmic solution Place 1 drop into both eyes at bedtime.        . vitamin E (ALPH-E) 400 UNIT capsule Take 400 Units by mouth 2 (two) times daily.        No current facility-administered medications on file prior to visit.    ROS:  Out of a complete 14 system review of symptoms, the patient complains only of the following symptoms, and all other reviewed systems are negative.  Difficulty swallowing  Light sensitivity, blurred vision Shortness of breath Leg swelling Cold intolerance Constipation Restless legs, daytime sleepiness Difficulty urinating, incontinence of bladder, frequency of urination Back pain, walking difficulty Tremors Depression  Blood pressure 99/61, pulse 76, weight 0 lb (0 kg).  Physical Exam  General: The patient is alert and cooperative at the time of the examination. The patient is moderately obese.   Skin: 3+ edema below the knees is noted bilaterally.    Neurologic Exam  Mental status: The patient is oriented x 3.  Cranial nerves: Facial symmetry is present. Speech is normal, no aphasia or dysarthria is noted. Extraocular movements are full. Visual fields are full.  Motor: The patient has good strength in all 4 extremities.  Sensory examination: soft touch sensation is symmetric on the face, arms, and legs.   Coordination: The patient has good finger-nose-finger, but he has difficulty performing of the shin bilaterally.  Gait and station: The patient has a stooped posture, shuffling gait. The patient requires some assistance with standing. He is using a wheelchair today, uses a walker at home. Tandem gait was not attempted. Romberg is negative. When sitting, the patient tends to lean to the left.   Reflexes: Deep tendon reflexes are symmetric, but are depressed.   Assessment/Plan:  One. Parkinson's disease  2. Gait disorder  3. Dysphagia  4. Restless leg syndrome  The patient will be increased on the Sinemet taking one full  tablet of the 25/100 mg tablets 3 times daily. He will followup in 3 months. I have encouraged him to remain active, trying to walk on a regular basis.    Marlan Palau MD 12/03/2013 9:12 AM  [facility] [address] [place], [place] [address]  Phone [phone] Fax [phone]

## 2013-12-09 ENCOUNTER — Ambulatory Visit (INDEPENDENT_AMBULATORY_CARE_PROVIDER_SITE_OTHER): Payer: Medicare Other | Admitting: Cardiology

## 2013-12-09 ENCOUNTER — Encounter: Payer: Self-pay | Admitting: Cardiology

## 2013-12-09 VITALS — BP 104/56 | HR 80 | Ht 67.0 in | Wt 209.6 lb

## 2013-12-09 DIAGNOSIS — I209 Angina pectoris, unspecified: Secondary | ICD-10-CM

## 2013-12-09 DIAGNOSIS — I509 Heart failure, unspecified: Secondary | ICD-10-CM

## 2013-12-09 DIAGNOSIS — I2510 Atherosclerotic heart disease of native coronary artery without angina pectoris: Secondary | ICD-10-CM

## 2013-12-09 DIAGNOSIS — I10 Essential (primary) hypertension: Secondary | ICD-10-CM

## 2013-12-09 DIAGNOSIS — I25119 Atherosclerotic heart disease of native coronary artery with unspecified angina pectoris: Secondary | ICD-10-CM

## 2013-12-09 DIAGNOSIS — I5032 Chronic diastolic (congestive) heart failure: Secondary | ICD-10-CM

## 2013-12-09 NOTE — Progress Notes (Signed)
[name] Date of Birth: [birth year] Medical Record #[id number]  History of Present Illness: Mr. Brislin is seen back today for a follow up visit.  He has known CAD with past CABG in 2000. Myoview in October of 2012 showed mild apical ischemia consistent with known severe distal LAD disease - normal EF at 56%. Last echo from 2012 showed normal EF.   Other issues include DM, HTN, HLD, CVD, gout, paranoid schizophrenia, tremor, Parkinson's.   Seen back in February of 2014 - complaining of increased edema, cough and SOB - weight was up. Had had his lasix increased, Norvasc stopped and losartan added. He improved clinically. Echo updated and his EF has decreased - down to 30 to 35%.   He was admitted in May with RLL PNA. Lasix held and he was hydrated. Was in [facility] for one month afterwards. His leg swelling actually improved a lot but has started again. Feet dependent much of the day. Reports sodium restriction. Has occasional chest pain described as in the "gas department". Takes Gas X. Weight is down 9 lbs since April.  Current Outpatient Prescriptions  Medication Sig Dispense Refill  . allopurinol (ZYLOPRIM) 300 MG tablet Take 300 mg by mouth every evening.       . benztropine (COGENTIN) 1 MG tablet Take 1 mg by mouth 3 (three) times daily.      . brimonidine (ALPHAGAN P) 0.1 % SOLN Place 1 drop into both eyes 2 (two) times daily.      . carbidopa-levodopa (SINEMET IR) 25-100 MG per tablet Take 1 tablet by mouth 3 (three) times daily.  90 tablet  5  . carvedilol (COREG) 12.5 MG tablet Take 0.5 tablets (6.25 mg total) by mouth 2 (two) times daily.  30 tablet  6  . clopidogrel (PLAVIX) 75 MG tablet Take 75 mg by mouth daily with breakfast.      . clotrimazole-betamethasone (LOTRISONE) cream Apply 1 application topically as needed.      . dorzolamide-timolol (COSOPT) 22.3-6.8 MG/ML ophthalmic solution Place 1 drop into both eyes 2 (two) times daily.      . furosemide (LASIX) 40 MG  tablet Take 40 mg by mouth daily.      [name] glipiZIDE-metformin (METAGLIP) 5-500 MG per tablet Take 1 tablet by mouth 2 (two) times daily.      [name] levofloxacin (LEVAQUIN) 500 MG tablet Take 1 tablet (500 mg total) by mouth daily.  5 tablet  0  . losartan (COZAAR) 100 MG tablet Take 1 tablet (100 mg total) by mouth daily.  30 tablet  6  . pantoprazole (PROTONIX) 40 MG tablet Take 40 mg by mouth daily.      . polyethylene glycol (MIRALAX / GLYCOLAX) packet Take 17 g by mouth daily as needed. For constipation      . potassium chloride 20 MEQ/15ML (10%) solution TAKE 15 MLS (20 MEQ TOTAL) BY MOUTH 3 (THREE) TIMES DAILY.  473 mL  3  . QUEtiapine (SEROQUEL) 50 MG tablet Take 50 mg by mouth 3 (three) times daily.       . sertraline (ZOLOFT) 25 MG tablet Take 25 mg by mouth 2 (two) times daily.       . simvastatin (ZOCOR) 20 MG tablet Take 20 mg by mouth daily at 6 PM.      . travoprost, benzalkonium, (TRAVATAN) 0.004 % ophthalmic solution Place 1 drop into both eyes at bedtime.        . vitamin E (ALPH-E) 400 UNIT capsule Take  400 Units by mouth 2 (two) times daily.        No current facility-administered medications for this visit.    No Known Allergies  Past Medical History  Diagnosis Date  . Coronary artery disease   . Diabetes mellitus   . Hypertension   . Hyperlipidemia   . CVD (cerebrovascular disease)   . Gout   . Glaucoma   . Paranoid schizophrenia   . Tremor   . History of orthostatic hypotension   . Dyspnea   . Diastolic dysfunction   . Fracture, humerus     right  . Parkinsonism 12/02/2012  . Abnormality of gait 12/02/2012  . Obesity   . PNA (pneumonia) May 2015    Past Surgical History  Procedure Laterality Date  . Cardiac catheterization  08/31/2006    MODERATE INFERIOR WALL HYPOKINESIA WITH EF 45%  . Coronary artery bypass graft  2008    LIMA GRAFT TO THE LAD, SAPHENOUS VEIN GRAFT TO THE DIAGONAL , SEQUENTIAL SAPHENOUS VEIN GRAFT TO THE PDA, POSTERIOR LATERAL , AND OBTUSE  MARGINAL VESSELS.  . Tonsillectomy    . Asd repair    . Cholecystectomy      History  Smoking status  . Former Smoker  . Quit date: 03/31/1958  Smokeless tobacco  . Never Used    Comment: quit 1960's    History  Alcohol Use No    Family History  Problem Relation Age of Onset  . Diabetes Father   . Cystic fibrosis Father   . Heart disease Father     Review of Systems: The review of systems is per the HPI.  All other systems were reviewed and are negative.  Physical Exam: BP 104/56  Pulse 80  Ht  (1.702 m)  Wt 209 lb 9.6 oz (95.074 kg)  BMI 32.82 kg/m2 Patient is very pleasant and in no acute distress. Less tremor. Skin is warm and dry. Color is normal.  HEENT is unremarkable. Normocephalic/atraumatic. PERRL. Sclera are nonicteric. Neck is supple. No masses. No JVD. Lungs are clear. Cardiac exam shows a regular rate and rhythm. Normal S1-2. soft apical systolic murmur. Abdomen is soft. Extremities are with 1-2+ edema.  Resting tremor noted.  No gross neurologic deficits noted.  Wt Readings from Last 3 Encounters:  12/09/13 209 lb 9.6 oz (95.074 kg)  08/04/13 218 lb 14.7 oz (99.3 kg)  07/08/13 218 lb 1.9 oz (98.939 kg)     LABORATORY DATA: BMET is pending  Lab Results  Component Value Date   WBC 4.1 08/04/2013   HGB 10.7* 08/04/2013   HCT 30.7* 08/04/2013   PLT 198 08/04/2013   GLUCOSE 112* 08/09/2013   CHOL  Value: 191        ATP III CLASSIFICATION:  <200     mg/dL   Desirable  161-096  mg/dL   Borderline High  >=045    mg/dL   High 4/0/9811   TRIG 196* 08/30/2006   HDL 29* 08/30/2006   LDLCALC  Value: 123        Total Cholesterol/HDL:CHD Risk Coronary Heart Disease Risk Table                     Men   Women  1/2 Average Risk   3.4   3.3* 08/30/2006   ALT 6 08/04/2013   AST 29 08/04/2013   NA 136* 08/09/2013   K 3.4* 08/09/2013   CL 99 08/09/2013   CREATININE 1.16 08/09/2013  BUN 15 08/09/2013   CO2 25 08/09/2013   TSH 2.300 08/04/2013   INR 1.8* 09/03/2006   HGBA1C  Value:  7.9 (NOTE) The ADA recommends the following therapeutic goal for glycemic control related to Hgb A1c measurement: Goal of therapy: <6.5 Hgb A1c  Reference: American Diabetes Association: Clinical Practice Recommendations 2010, Diabetes Care, 2010, 33: (Suppl  1).* 03/27/2009   Ecg: NSR with LBBB  Assessment / Plan:  1. Ischemic CM - EF now down at 30 to 35% -some swelling.  On Coreg and lasix. Recommend sodium restriction. Needs to resume using support hose.   2. CAD - remote CABG - last Myoview in 2012 with mild apical ischemic and felt to be consistent with his known severe distal LAD disease - no active chest pain. Would favor conservative management.   3. HTN - well controlled.   4. HLD   5. DM   6. Parkinson's  7. Schizophrenia  8. Intermittent LBBB.   I will follow up in 4 months.

## 2013-12-09 NOTE — Patient Instructions (Signed)
Continue your current therapy  Sodium restriction/ support hose  I will see you in 4 months.

## 2013-12-28 ENCOUNTER — Telehealth: Payer: Self-pay | Admitting: Neurology

## 2013-12-28 NOTE — Telephone Encounter (Signed)
See phone note

## 2013-12-28 NOTE — Telephone Encounter (Signed)
I called the patient, talked with the wife. The patient is having side effects of drowsiness and some confusion on the medication. The patient was recently increased from one half of a Sinemet tablet 25/100 mg 3 times daily to a full tablet 3 times daily. He is to cut back to one half tablet twice during the day, and one full tablet at night. He'll do this for 3 weeks, if he is tolerating the medication he will take one full tablet in the morning, half a tablet at midday, and a full tablet in the evening. He will do this for 3 weeks, and if he is doing well, he will go back to 1 full tablet 3 times daily. If they are having problems with this dose increase, the family is to contact our office.

## 2013-12-28 NOTE — Telephone Encounter (Signed)
Patient's wife calling to state that patient has been very sleepy and disoriented, believes it to be due to the carbidopa levodopa, please return call and advise.

## 2013-12-29 ENCOUNTER — Other Ambulatory Visit: Payer: Self-pay

## 2013-12-29 MED ORDER — CARBIDOPA-LEVODOPA 25-100 MG PO TABS: 1.0000 | ORAL_TABLET | Freq: Three times a day (TID) | ORAL | Status: DC

## 2013-12-29 NOTE — Telephone Encounter (Signed)
Pharmacy requested 90 day Rx  

## 2014-02-03 ENCOUNTER — Other Ambulatory Visit (HOSPITAL_COMMUNITY): Payer: Self-pay | Admitting: *Deleted

## 2014-02-03 DIAGNOSIS — I6523 Occlusion and stenosis of bilateral carotid arteries: Secondary | ICD-10-CM

## 2014-02-15 ENCOUNTER — Encounter: Payer: Self-pay | Admitting: Neurology

## 2014-02-17 ENCOUNTER — Encounter (HOSPITAL_COMMUNITY): Payer: Medicare Other

## 2014-02-21 ENCOUNTER — Encounter: Payer: Self-pay | Admitting: Neurology

## 2014-02-24 ENCOUNTER — Ambulatory Visit (HOSPITAL_COMMUNITY): Payer: Medicare Other | Attending: Internal Medicine | Admitting: *Deleted

## 2014-02-24 DIAGNOSIS — I6523 Occlusion and stenosis of bilateral carotid arteries: Secondary | ICD-10-CM

## 2014-02-24 DIAGNOSIS — I10 Essential (primary) hypertension: Secondary | ICD-10-CM | POA: Diagnosis not present

## 2014-02-24 DIAGNOSIS — I2510 Atherosclerotic heart disease of native coronary artery without angina pectoris: Secondary | ICD-10-CM | POA: Insufficient documentation

## 2014-02-24 DIAGNOSIS — E119 Type 2 diabetes mellitus without complications: Secondary | ICD-10-CM | POA: Diagnosis not present

## 2014-02-24 DIAGNOSIS — E785 Hyperlipidemia, unspecified: Secondary | ICD-10-CM | POA: Diagnosis not present

## 2014-02-24 DIAGNOSIS — Z720 Tobacco use: Secondary | ICD-10-CM | POA: Diagnosis not present

## 2014-02-24 DIAGNOSIS — Z951 Presence of aortocoronary bypass graft: Secondary | ICD-10-CM | POA: Diagnosis not present

## 2014-02-24 NOTE — Progress Notes (Signed)
Carotid duplex complete 

## 2014-03-02 ENCOUNTER — Other Ambulatory Visit: Payer: Self-pay

## 2014-03-02 DIAGNOSIS — I739 Peripheral vascular disease, unspecified: Principal | ICD-10-CM

## 2014-03-02 DIAGNOSIS — I779 Disorder of arteries and arterioles, unspecified: Secondary | ICD-10-CM

## 2014-03-08 ENCOUNTER — Other Ambulatory Visit: Payer: Self-pay | Admitting: *Deleted

## 2014-03-08 MED ORDER — SIMVASTATIN 20 MG PO TABS: 20.0000 mg | ORAL_TABLET | Freq: Every day | ORAL | Status: DC

## 2014-03-10 ENCOUNTER — Ambulatory Visit: Payer: Medicare Other | Admitting: Neurology

## 2014-03-17 ENCOUNTER — Other Ambulatory Visit: Payer: Self-pay | Admitting: *Deleted

## 2014-03-17 ENCOUNTER — Telehealth: Payer: Self-pay | Admitting: Cardiology

## 2014-03-17 MED ORDER — POTASSIUM CHLORIDE 20 MEQ/15ML (10%) PO SOLN: 20.0000 meq | Freq: Three times a day (TID) | ORAL | Status: DC

## 2014-03-17 NOTE — Telephone Encounter (Signed)
Pt still have not received his Potassium. Would you please call it today to [phone].

## 2014-03-29 ENCOUNTER — Other Ambulatory Visit: Payer: Self-pay | Admitting: Nurse Practitioner

## 2014-04-06 ENCOUNTER — Other Ambulatory Visit: Payer: Self-pay | Admitting: *Deleted

## 2014-04-06 MED ORDER — SIMVASTATIN 20 MG PO TABS: 20.0000 mg | ORAL_TABLET | Freq: Every day | ORAL | Status: DC

## 2014-04-07 ENCOUNTER — Other Ambulatory Visit: Payer: Self-pay

## 2014-04-07 MED ORDER — SIMVASTATIN 20 MG PO TABS: 20.0000 mg | ORAL_TABLET | Freq: Every day | ORAL | Status: DC

## 2014-04-14 ENCOUNTER — Encounter: Payer: Self-pay | Admitting: Cardiology

## 2014-04-14 ENCOUNTER — Ambulatory Visit (INDEPENDENT_AMBULATORY_CARE_PROVIDER_SITE_OTHER): Payer: Medicare Other | Admitting: Cardiology

## 2014-04-14 VITALS — BP 130/70 | HR 60 | Ht 67.0 in | Wt 213.5 lb

## 2014-04-14 DIAGNOSIS — I10 Essential (primary) hypertension: Secondary | ICD-10-CM

## 2014-04-14 DIAGNOSIS — I25119 Atherosclerotic heart disease of native coronary artery with unspecified angina pectoris: Secondary | ICD-10-CM

## 2014-04-14 DIAGNOSIS — I5032 Chronic diastolic (congestive) heart failure: Secondary | ICD-10-CM

## 2014-04-14 NOTE — Progress Notes (Signed)
Terrance Perez Date of Birth: July 09, 1933 Medical Record #[id number]  History of Present Illness: Terrance Perez is seen for follow up of CHF. [name]  He has known CAD with past CABG in 2000. Myoview in October of 2012 showed mild apical ischemia consistent with known severe distal LAD disease - normal EF at 56%. He also has CHF with Echo in March 2015 showing an EF 35-40%. Other issues include DM, HTN, HLD, CVD, gout, paranoid schizophrenia, tremor, Parkinson's.   On follow up today he reports intermittent pressure build up in his lower abdomen that radiates into his shoulders then resolves. Lasts 10 seconds. Thinks swelling is about the same. Wears support hose. Wife monitors weight and it has been fairly stable. He has gain 4 lbs since September but prior to that he had lost 9 lbs after admission for PNA last April. He is seeing Dr. Jacky Kindle this afternoon with lab work.  Current Outpatient Prescriptions  Medication Sig Dispense Refill  . allopurinol (ZYLOPRIM) 300 MG tablet Take 300 mg by mouth every evening.     . benztropine (COGENTIN) 1 MG tablet Take 1 mg by mouth 3 (three) times daily.    . brimonidine (ALPHAGAN P) 0.1 % SOLN Place 1 drop into both eyes 2 (two) times daily.    . carbidopa-levodopa (SINEMET IR) 25-100 MG per tablet Take 1 tablet by mouth 3 (three) times daily. (Patient taking differently: Take 0.5 tablets by mouth 3 (three) times daily. ) 270 tablet 1  . carvedilol (COREG) 12.5 MG tablet TAKE 1/2 TABLET BY MOUTH TWICE A DAY 30 tablet 3  . clopidogrel (PLAVIX) 75 MG tablet Take 75 mg by mouth daily with breakfast.    . clotrimazole-betamethasone (LOTRISONE) cream Apply 1 application topically as needed.    . dorzolamide-timolol (COSOPT) 22.3-6.8 MG/ML ophthalmic solution Place 1 drop into both eyes 2 (two) times daily.    . furosemide (LASIX) 40 MG tablet Take 40 mg by mouth daily.    [name] glipiZIDE-metformin (METAGLIP) 5-500 MG per tablet Take 1 tablet by mouth 2 (two) times  daily.    [name] levofloxacin (LEVAQUIN) 500 MG tablet Take 1 tablet (500 mg total) by mouth daily. 5 tablet 0  . losartan (COZAAR) 100 MG tablet Take 1 tablet (100 mg total) by mouth daily. 30 tablet 6  . pantoprazole (PROTONIX) 40 MG tablet Take 40 mg by mouth daily.    . polyethylene glycol (MIRALAX / GLYCOLAX) packet Take 17 g by mouth daily as needed. For constipation    . potassium chloride 20 MEQ/15ML (10%) SOLN Take 15 mLs (20 mEq total) by mouth 3 (three) times daily. 450 mL 5  . QUEtiapine (SEROQUEL) 50 MG tablet Take 50 mg by mouth 3 (three) times daily.     . sertraline (ZOLOFT) 25 MG tablet Take 25 mg by mouth 2 (two) times daily. Take 1 25 mg by mouth at bedtime.    . simvastatin (ZOCOR) 20 MG tablet Take 1 tablet (20 mg total) by mouth daily at 6 PM. 30 tablet 6  . travoprost, benzalkonium, (TRAVATAN) 0.004 % ophthalmic solution Place 1 drop into both eyes at bedtime.      . vitamin E (ALPH-E) 400 UNIT capsule Take 400 Units by mouth 2 (two) times daily.      No current facility-administered medications for this visit.    No Known Allergies  Past Medical History  Diagnosis Date  . Coronary artery disease   . Diabetes mellitus   . Hypertension   .  Hyperlipidemia   . CVD (cerebrovascular disease)   . Gout   . Glaucoma   . Paranoid schizophrenia   . Tremor   . History of orthostatic hypotension   . Dyspnea   . Diastolic dysfunction   . Fracture, humerus     right  . Parkinsonism 12/02/2012  . Abnormality of gait 12/02/2012  . Obesity   . PNA (pneumonia) May 2015    Past Surgical History  Procedure Laterality Date  . Cardiac catheterization  08/31/2006    MODERATE INFERIOR WALL HYPOKINESIA WITH EF 45%  . Coronary artery bypass graft  2008    LIMA GRAFT TO THE LAD, SAPHENOUS VEIN GRAFT TO THE DIAGONAL , SEQUENTIAL SAPHENOUS VEIN GRAFT TO THE PDA, POSTERIOR LATERAL , AND OBTUSE MARGINAL VESSELS.  . Tonsillectomy    . Asd repair    . Cholecystectomy      History    Smoking status  . Former Smoker  . Quit date: 03/31/1958  Smokeless tobacco  . Never Used    Comment: quit 1960's    History  Alcohol Use No    Family History  Problem Relation Age of Onset  . Diabetes Father   . Cystic fibrosis Father   . Heart disease Father     Review of Systems: The review of systems is per the HPI.  All other systems were reviewed and are negative.  Physical Exam: BP 130/70 mmHg  Pulse 60  Ht 5\' 7"  (1.702 m)  Wt 213 lb 8 oz (96.843 kg)  BMI 33.43 kg/m2 Patient is very pleasant and in no acute distress. Less tremor. Skin is warm and dry. Color is normal.  HEENT is unremarkable. Normocephalic/atraumatic. PERRL. Sclera are nonicteric. Neck is supple. No masses. No JVD. Lungs are clear. Cardiac exam shows a regular rate and rhythm. Normal S1-2. soft apical systolic murmur. Abdomen is soft. Extremities are with 1-2+ edema.  Resting tremor noted.  No gross neurologic deficits noted.  Wt Readings from Last 3 Encounters:  04/14/14 213 lb 8 oz (96.843 kg)  12/09/13 209 lb 9.6 oz (95.074 kg)  08/04/13 218 lb 14.7 oz (99.3 kg)     LABORATORY DATA:   Lab Results  Component Value Date   WBC 4.1 08/04/2013   HGB 10.7* 08/04/2013   HCT 30.7* 08/04/2013   PLT 198 08/04/2013   GLUCOSE 112* 08/09/2013   CHOL  08/30/2006    191        ATP III CLASSIFICATION:  <200     mg/dL   Desirable  161-096200-239  mg/dL   Borderline High  >=045>=240    mg/dL   High   TRIG 409196* 81/19/147806/03/2006   HDL 29* 08/30/2006   LDLCALC * 08/30/2006    123        Total Cholesterol/HDL:CHD Risk Coronary Heart Disease Risk Table                     Men   Women  1/2 Average Risk   3.4   3.3   ALT 6 08/04/2013   AST 29 08/04/2013   NA 136* 08/09/2013   K 3.4* 08/09/2013   CL 99 08/09/2013   CREATININE 1.16 08/09/2013   BUN 15 08/09/2013   CO2 25 08/09/2013   TSH 2.300 08/04/2013   INR 1.8* 09/03/2006   HGBA1C * 03/27/2009    7.9 (NOTE) The ADA recommends the following therapeutic goal  for glycemic control related to Hgb A1c measurement: Goal of therapy: <6.5  Hgb A1c  Reference: American Diabetes Association: Clinical Practice Recommendations 2010, Diabetes Care, 2010, 33: (Suppl  1).     Assessment / Plan:  1. Ischemic CM - EF 35-40% -some swelling.  This is stable on exam. On Coreg and lasix. Recommend sodium restriction. Needs to continue using support hose. If weight increases by 3 lbs or increase edema recommend he take an extra Lasix. Follow up labs today with primary care.  2. CAD - remote CABG - last Myoview in 2012 with mild apical ischemic and felt to be consistent with his known severe distal LAD disease - no active chest pain. Would favor conservative management.   3. HTN - well controlled.   4. HLD   5. DM   6. Parkinson's  7. Schizophrenia  8. Intermittent LBBB.   I will follow up in 6 months.

## 2014-04-14 NOTE — Patient Instructions (Signed)
Continue your current medication  Monitor your weight periodically. If your weight goes up by 3 lbs or if your have increased swelling you can take an extra lasix.

## 2014-06-08 ENCOUNTER — Encounter: Payer: Self-pay | Admitting: Neurology

## 2014-06-08 ENCOUNTER — Ambulatory Visit (INDEPENDENT_AMBULATORY_CARE_PROVIDER_SITE_OTHER): Payer: Medicare Other | Admitting: Neurology

## 2014-06-08 VITALS — BP 151/78 | HR 76 | Ht 67.0 in | Wt 208.0 lb

## 2014-06-08 DIAGNOSIS — R269 Unspecified abnormalities of gait and mobility: Secondary | ICD-10-CM

## 2014-06-08 DIAGNOSIS — G20 Parkinson's disease: Secondary | ICD-10-CM

## 2014-06-08 DIAGNOSIS — R1314 Dysphagia, pharyngoesophageal phase: Secondary | ICD-10-CM | POA: Insufficient documentation

## 2014-06-08 HISTORY — DX: Dysphagia, pharyngoesophageal phase: R13.14

## 2014-06-08 MED ORDER — CARBIDOPA-LEVODOPA-ENTACAPONE 12.5-50-200 MG PO TABS: 1.0000 | ORAL_TABLET | Freq: Three times a day (TID) | ORAL | Status: DC

## 2014-06-08 NOTE — Patient Instructions (Signed)
Parkinson Disease Parkinson disease is a disorder of the central nervous system, which includes the brain and spinal cord. A person with this disease slowly loses the ability to completely control body movements. Within the brain, there is a group of nerve cells (basal ganglia) that help control movement. The basal ganglia are damaged and do not work properly in a person with Parkinson disease. In addition, the basal ganglia produce and use a brain chemical called dopamine. The dopamine chemical sends messages to other parts of the body to control and coordinate body movements. Dopamine levels are low in a person with Parkinson disease. If the dopamine levels are low, then the body does not receive the correct messages it needs to move normally.  CAUSES  The exact reason why the basal ganglia get damaged is not known. Some medical researchers have thought that infection, genes, environment, and certain medicines may contribute to the cause.  SYMPTOMS   An early symptom of Parkinson disease is often an uncontrolled shaking (tremor) of the hands. The tremor will often disappear when the affected hand is consciously used.  As the disease progresses, walking, talking, getting out of a chair, and new movements become more difficult.  Muscles get stiff and movements become slower.  Balance and coordination become harder.  Depression, trouble swallowing, urinary problems, constipation, and sleep problems can occur.  Later in the disease, memory and thought processes may deteriorate. DIAGNOSIS  There are no specific tests to diagnose Parkinson disease. You may be referred to a neurologist for evaluation. Your caregiver will ask about your medical history, symptoms, and perform a physical exam. Blood tests and imaging tests of your brain may be performed to rule out other diseases. The imaging tests may include an MRI or a CT scan. TREATMENT  The goal of treatment is to relieve symptoms. Medicines may be  prescribed once the symptoms become troublesome. Medicine will not stop the progression of the disease, but medicine can make movement and balance better and help control tremors. Speech and occupational therapy may also be prescribed. Sometimes, surgical treatment of the brain can be done in young people. HOME CARE INSTRUCTIONS  Get regular exercise and rest periods during the day to help prevent exhaustion and depression.  If getting dressed becomes difficult, replace buttons and zippers with Velcro and elastic on your clothing.  Take all medicine as directed by your caregiver.  Install grab bars or railings in your home to prevent falls.  Go to speech or occupational therapy as directed.  Keep all follow-up visits as directed by your caregiver. SEEK MEDICAL CARE IF:  Your symptoms are not controlled with your medicine.  You fall.  You have trouble swallowing or choke on your food. MAKE SURE YOU:  Understand these instructions.  Will watch your condition.  Will get help right away if you are not doing well or get worse. Document Released: 03/14/2000 Document Revised: 07/12/2012 Document Reviewed: 04/16/2011 ExitCare Patient Information 2015 [employer]. This information is not intended to replace advice given to you by your health care provider. Make sure you discuss any questions you have with your health care provider.  

## 2014-06-08 NOTE — Progress Notes (Signed)
Reason for visit: Parkinson's disease  Terrance Perez is an 81 y.o. male  History of present illness:  Terrance Perez is a 81 year old left-handed white male with a history of Parkinson's disease with a significant gait disorder. The patient is extremely limited in his ability to take Sinemet, he is on the 25/100 mg Sinemet tablets taking one half tablet 3 times daily. The patient was to go up to a full tablet 3 times a day, but he began having significant increasing confusion and hallucinations. He is on Seroquel 50 mg 3 times daily. The patient tends to sleep during the day, and stay awake at night. This follows the pattern of his work schedule when he was younger. The patient is minimally ambulatory. He can walk short distances with his walker, he does not attempt to walk very often. He has fallen on occasion, the last fall was one week prior to this evaluation. The patient has a tendency to fall backwards. He does also note some problem with dysphagia. The patient has thickened liquids, he chokes on occasion. The patient requires some assistance with dressing and occasionally with bathing himself. The patient returns to this office for an evaluation.  Past Medical History  Diagnosis Date  . Coronary artery disease   . Diabetes mellitus   . Hypertension   . Hyperlipidemia   . CVD (cerebrovascular disease)   . Gout   . Glaucoma   . Paranoid schizophrenia   . Tremor   . History of orthostatic hypotension   . Dyspnea   . Diastolic dysfunction   . Fracture, humerus     right  . Parkinsonism 12/02/2012  . Abnormality of gait 12/02/2012  . Obesity   . PNA (pneumonia) May 2015  . Dysphagia, pharyngoesophageal phase 06/08/2014    Thickened fluids    Past Surgical History  Procedure Laterality Date  . Cardiac catheterization  08/31/2006    MODERATE INFERIOR WALL HYPOKINESIA WITH EF 45%  . Coronary artery bypass graft  2008    LIMA GRAFT TO THE LAD, SAPHENOUS VEIN GRAFT TO THE DIAGONAL ,  SEQUENTIAL SAPHENOUS VEIN GRAFT TO THE PDA, POSTERIOR LATERAL , AND OBTUSE MARGINAL VESSELS.  . Tonsillectomy    . Asd repair    . Cholecystectomy      Family History  Problem Relation Age of Onset  . Diabetes Father   . Cystic fibrosis Father   . Heart disease Father     Social history:  reports that he quit smoking about 56 years ago. He has never used smokeless tobacco. He reports that he does not drink alcohol or use illicit drugs.   No Known Allergies  Medications:  Prior to Admission medications   Medication Sig Start Date End Date Taking? Authorizing Provider  allopurinol (ZYLOPRIM) 300 MG tablet Take 300 mg by mouth every evening.    Yes Historical Provider, MD  benztropine (COGENTIN) 1 MG tablet Take 1 mg by mouth 3 (three) times daily.   Yes Historical Provider, MD  brimonidine (ALPHAGAN P) 0.1 % SOLN Place 1 drop into both eyes 2 (two) times daily.   Yes Historical Provider, MD  carbidopa-levodopa (SINEMET IR) 25-100 MG per tablet Take 1 tablet by mouth 3 (three) times daily. Patient taking differently: Take 0.5 tablets by mouth 3 (three) times daily.  12/29/13  Yes York Spaniel, MD  carvedilol (COREG) 12.5 MG tablet TAKE 1/2 TABLET BY MOUTH TWICE A DAY 03/30/14  Yes Rosalio Macadamia, NP  clopidogrel (PLAVIX)  75 MG tablet Take 75 mg by mouth daily with breakfast.   Yes Historical Provider, MD  clotrimazole-betamethasone (LOTRISONE) cream Apply 1 application topically as needed. 10/07/13  Yes Historical Provider, MD  dorzolamide-timolol (COSOPT) 22.3-6.8 MG/ML ophthalmic solution Place 1 drop into both eyes 2 (two) times daily.   Yes Historical Provider, MD  furosemide (LASIX) 40 MG tablet Take 40 mg by mouth daily.   Yes Historical Provider, MD  glipiZIDE-metformin (METAGLIP) 5-500 MG per tablet Take 1 tablet by mouth 2 (two) times daily. 10/26/13  Yes Historical Provider, MD  losartan (COZAAR) 100 MG tablet Take 1 tablet (100 mg total) by mouth daily. 06/30/13  Yes Rosalio Macadamia, NP  pantoprazole (PROTONIX) 40 MG tablet Take 40 mg by mouth daily.   Yes Historical Provider, MD  polyethylene glycol (MIRALAX / GLYCOLAX) packet Take 17 g by mouth daily as needed. For constipation   Yes Historical Provider, MD  potassium chloride 20 MEQ/15ML (10%) SOLN Take 15 mLs (20 mEq total) by mouth 3 (three) times daily. 03/17/14  Yes Peter M [place], MD  QUEtiapine (SEROQUEL) 50 MG tablet Take 50 mg by mouth 3 (three) times daily.    Yes Historical Provider, MD  sertraline (ZOLOFT) 25 MG tablet Take 25 mg by mouth at bedtime. Take 1 25 mg by mouth at bedtime. 11/10/12  Yes Historical Provider, MD  simvastatin (ZOCOR) 20 MG tablet Take 1 tablet (20 mg total) by mouth daily at 6 PM. 04/07/14  Yes Peter M [place], MD  travoprost, benzalkonium, (TRAVATAN) 0.004 % ophthalmic solution Place 1 drop into both eyes at bedtime.     Yes Historical Provider, MD  vitamin E (ALPH-E) 400 UNIT capsule Take 400 Units by mouth 2 (two) times daily.    Yes Historical Provider, MD    ROS:  Out of a complete 14 system review of symptoms, the patient complains only of the following symptoms, and all other reviewed systems are negative.  Difficulty swallowing, drooling Eye redness, light sensitivity, loss of vision, blurred vision Leg swelling Constipation Restless legs Difficulty urinating, incontinence of the bladder Walking difficulty Tremors, confusion, hallucinations  Blood pressure 151/78, pulse 76, height  (1.702 m), weight 208 lb (94.348 kg).  Physical Exam  General: The patient is alert and cooperative at the time of the examination. The patient is moderately obese.  Skin: 1-2+ edema at the ankles is noted bilaterally.   Neurologic Exam  Mental status: The patient is alert and oriented x 3 at the time of the examination. The patient has apparent normal recent and remote memory, with an apparently normal attention span and concentration ability.   Cranial nerves: Facial  symmetry is present. Speech is dysphonic, not aphasic. Extraocular movements are full. Visual fields are full.  Motor: The patient has good strength in all 4 extremities.  Sensory examination: Soft touch sensation is symmetric on the face, arms, and legs.  Coordination: The patient has good finger-nose-finger and heel-to-shin bilaterally.  Gait and station: The patient requires assistance with standing and walking. He can take short shuffling steps, he has difficulty with turns. Tandem gait was not attempted.  Reflexes: Deep tendon reflexes are symmetric, but are depressed.   Assessment/Plan:  1. Parkinson's disease  2. Gait disorder  3. Dysphagia  4. Frequent hallucinations  The patient is minimally ambulatory at this time, he continues to progress. The patient has been quite limited in his ability to take Sinemet, a minimal increase in dosing has resulted in increased confusion.  He has his day and night sleep cycles inverted, sleeping during the day, and staying awake at night. The patient will be tried on Stalevo taking the 12.5/50/200 mg tablets 3 times daily. If he has problems on this medication change, he is to contact our office. I have encouraged him to try to walk on a regular basis. The patient is to remain on thickened liquids for his dysphagia. He will follow-up in about 4 months.  Marlan Palau. Keith Hamdi Kley MD 06/08/2014 8:14 PM  [facility] [address] [place] [id number]  Phone [phone] Fax [phone]

## 2014-06-28 ENCOUNTER — Other Ambulatory Visit: Payer: Self-pay | Admitting: Neurology

## 2014-06-29 NOTE — Telephone Encounter (Signed)
Former [facility] patient.  He had been prescribing this med since 2011

## 2014-07-18 ENCOUNTER — Observation Stay (HOSPITAL_COMMUNITY)
Admission: EM | Admit: 2014-07-18 | Discharge: 2014-07-19 | Disposition: A | Discharge status: Home / Self Care | Payer: Medicare Other | Attending: Internal Medicine | Admitting: Internal Medicine

## 2014-07-18 ENCOUNTER — Telehealth: Payer: Self-pay | Admitting: Cardiology

## 2014-07-18 ENCOUNTER — Encounter (HOSPITAL_COMMUNITY): Payer: Self-pay

## 2014-07-18 ENCOUNTER — Observation Stay (HOSPITAL_COMMUNITY): Payer: Medicare Other

## 2014-07-18 DIAGNOSIS — Z87891 Personal history of nicotine dependence: Secondary | ICD-10-CM | POA: Diagnosis not present

## 2014-07-18 DIAGNOSIS — E785 Hyperlipidemia, unspecified: Secondary | ICD-10-CM | POA: Diagnosis not present

## 2014-07-18 DIAGNOSIS — N289 Disorder of kidney and ureter, unspecified: Secondary | ICD-10-CM

## 2014-07-18 DIAGNOSIS — E119 Type 2 diabetes mellitus without complications: Secondary | ICD-10-CM | POA: Insufficient documentation

## 2014-07-18 DIAGNOSIS — I252 Old myocardial infarction: Secondary | ICD-10-CM | POA: Diagnosis not present

## 2014-07-18 DIAGNOSIS — I2510 Atherosclerotic heart disease of native coronary artery without angina pectoris: Secondary | ICD-10-CM | POA: Diagnosis not present

## 2014-07-18 DIAGNOSIS — F200 Paranoid schizophrenia: Secondary | ICD-10-CM | POA: Insufficient documentation

## 2014-07-18 DIAGNOSIS — G20 Parkinson's disease: Secondary | ICD-10-CM | POA: Diagnosis not present

## 2014-07-18 DIAGNOSIS — Z951 Presence of aortocoronary bypass graft: Secondary | ICD-10-CM | POA: Insufficient documentation

## 2014-07-18 DIAGNOSIS — R079 Chest pain, unspecified: Principal | ICD-10-CM | POA: Insufficient documentation

## 2014-07-18 DIAGNOSIS — Z6834 Body mass index (BMI) 34.0-34.9, adult: Secondary | ICD-10-CM | POA: Diagnosis not present

## 2014-07-18 DIAGNOSIS — I10 Essential (primary) hypertension: Secondary | ICD-10-CM | POA: Diagnosis present

## 2014-07-18 DIAGNOSIS — Z794 Long term (current) use of insulin: Secondary | ICD-10-CM | POA: Diagnosis not present

## 2014-07-18 DIAGNOSIS — M109 Gout, unspecified: Secondary | ICD-10-CM | POA: Diagnosis not present

## 2014-07-18 DIAGNOSIS — E669 Obesity, unspecified: Secondary | ICD-10-CM | POA: Insufficient documentation

## 2014-07-18 DIAGNOSIS — I5022 Chronic systolic (congestive) heart failure: Secondary | ICD-10-CM | POA: Insufficient documentation

## 2014-07-18 LAB — CBC
HCT: 33.9 % — ABNORMAL LOW (ref 39.0–52.0)
HEMATOCRIT: 34.1 % — AB (ref 39.0–52.0)
HEMOGLOBIN: 11 g/dL — AB (ref 13.0–17.0)
Hemoglobin: 11 g/dL — ABNORMAL LOW (ref 13.0–17.0)
MCH: 31.3 pg (ref 26.0–34.0)
MCH: 31.2 pg (ref 26.0–34.0)
MCHC: 32.4 g/dL (ref 30.0–36.0)
MCHC: 32.3 g/dL (ref 30.0–36.0)
MCV: 96.3 fL (ref 78.0–100.0)
MCV: 96.6 fL (ref 78.0–100.0)
PLATELETS: 237 10*3/uL (ref 150–400)
Platelets: 205 10*3/uL (ref 150–400)
RBC: 3.52 MIL/uL — AB (ref 4.22–5.81)
RBC: 3.53 MIL/uL — ABNORMAL LOW (ref 4.22–5.81)
RDW: 14.2 % (ref 11.5–15.5)
RDW: 14.1 % (ref 11.5–15.5)
WBC: 7.2 10*3/uL (ref 4.0–10.5)
WBC: 8.4 10*3/uL (ref 4.0–10.5)

## 2014-07-18 LAB — BASIC METABOLIC PANEL
Anion gap: 10 (ref 5–15)
BUN: 27 mg/dL — ABNORMAL HIGH (ref 6–23)
CALCIUM: 8.7 mg/dL (ref 8.4–10.5)
CHLORIDE: 97 mmol/L (ref 96–112)
CO2: 26 mmol/L (ref 19–32)
CREATININE: 1.45 mg/dL — AB (ref 0.50–1.35)
GFR calc non Af Amer: 45 mL/min — ABNORMAL LOW (ref >90)
GFR, EST AFRICAN AMERICAN: 52 mL/min — AB (ref >90)
Glucose, Bld: 174 mg/dL — ABNORMAL HIGH (ref 70–99)
Potassium: 4.1 mmol/L (ref 3.5–5.1)
Sodium: 133 mmol/L — ABNORMAL LOW (ref 135–145)

## 2014-07-18 LAB — I-STAT TROPONIN, ED: TROPONIN I, POC: 0 ng/mL (ref 0.00–0.08)

## 2014-07-18 LAB — TROPONIN I: Troponin I: <0.03 ng/mL (ref <0.031)

## 2014-07-18 LAB — CREATININE, SERUM
Creatinine, Ser: 1.4 mg/dL — ABNORMAL HIGH (ref 0.50–1.35)
GFR calc Af Amer: 54 mL/min — ABNORMAL LOW (ref >90)
GFR, EST NON AFRICAN AMERICAN: 47 mL/min — AB (ref >90)

## 2014-07-18 MED ORDER — SIMVASTATIN 20 MG PO TABS
20.0000 mg | ORAL_TABLET | Freq: Every day | ORAL | Status: DC
  Administered 2014-07-19: 20 mg via ORAL
  Filled 2014-07-18: qty 1

## 2014-07-18 MED ORDER — HEPARIN SODIUM (PORCINE) 5000 UNIT/ML IJ SOLN
5000.0000 [IU] | Freq: Three times a day (TID) | INTRAMUSCULAR | Status: DC
  Administered 2014-07-18 – 2014-07-19 (×3): 5000 [IU] via SUBCUTANEOUS
  Filled 2014-07-18: qty 1

## 2014-07-18 MED ORDER — GLIPIZIDE-METFORMIN HCL 5-500 MG PO TABS: 1.0000 | ORAL_TABLET | Freq: Two times a day (BID) | ORAL | Status: DC

## 2014-07-18 MED ORDER — ACETAMINOPHEN 325 MG PO TABS: 650.0000 mg | ORAL_TABLET | ORAL | Status: DC | PRN

## 2014-07-18 MED ORDER — ALLOPURINOL 300 MG PO TABS
300.0000 mg | ORAL_TABLET | Freq: Every evening | ORAL | Status: DC
  Administered 2014-07-19: 300 mg via ORAL
  Filled 2014-07-18: qty 1

## 2014-07-18 MED ORDER — ONDANSETRON HCL 4 MG/2ML IJ SOLN: 4.0000 mg | Freq: Three times a day (TID) | INTRAMUSCULAR | Status: DC | PRN

## 2014-07-18 MED ORDER — TRAVOPROST 0.004 % OP SOLN: 1.0000 [drp] | Freq: Every day | OPHTHALMIC | Status: DC

## 2014-07-18 MED ORDER — GI COCKTAIL ~~LOC~~: 30.0000 mL | Freq: Four times a day (QID) | ORAL | Status: DC | PRN

## 2014-07-18 MED ORDER — PANTOPRAZOLE SODIUM 40 MG PO TBEC
40.0000 mg | DELAYED_RELEASE_TABLET | Freq: Every day | ORAL | Status: DC
  Administered 2014-07-19: 40 mg via ORAL
  Filled 2014-07-18: qty 1

## 2014-07-18 MED ORDER — LOSARTAN POTASSIUM 50 MG PO TABS
100.0000 mg | ORAL_TABLET | Freq: Every day | ORAL | Status: DC
  Administered 2014-07-19: 100 mg via ORAL
  Filled 2014-07-18: qty 2

## 2014-07-18 MED ORDER — ASPIRIN 81 MG PO CHEW
324.0000 mg | CHEWABLE_TABLET | Freq: Once | ORAL | Status: AC
  Administered 2014-07-18: 324 mg via ORAL
  Filled 2014-07-18: qty 4

## 2014-07-18 MED ORDER — GLIPIZIDE 10 MG PO TABS
5.0000 mg | ORAL_TABLET | Freq: Two times a day (BID) | ORAL | Status: DC
  Administered 2014-07-19 (×2): 5 mg via ORAL
  Filled 2014-07-18 (×2): qty 1

## 2014-07-18 MED ORDER — CARVEDILOL 6.25 MG PO TABS
6.2500 mg | ORAL_TABLET | Freq: Two times a day (BID) | ORAL | Status: DC
  Administered 2014-07-19 (×2): 6.25 mg via ORAL
  Filled 2014-07-18 (×2): qty 1

## 2014-07-18 MED ORDER — ENTACAPONE 200 MG PO TABS
200.0000 mg | ORAL_TABLET | Freq: Three times a day (TID) | ORAL | Status: DC
  Administered 2014-07-18 – 2014-07-19 (×3): 200 mg via ORAL
  Filled 2014-07-18 (×4): qty 1

## 2014-07-18 MED ORDER — BRIMONIDINE TARTRATE 0.15 % OP SOLN
1.0000 [drp] | Freq: Three times a day (TID) | OPHTHALMIC | Status: DC
  Administered 2014-07-18 – 2014-07-19 (×3): 1 [drp] via OPHTHALMIC
  Filled 2014-07-18: qty 5

## 2014-07-18 MED ORDER — SODIUM CHLORIDE 0.9 % IV SOLN
INTRAVENOUS | Status: DC
Start: 2014-07-18 — End: 2014-07-19
  Administered 2014-07-18: 22:00:00 via INTRAVENOUS

## 2014-07-18 MED ORDER — CLOPIDOGREL BISULFATE 75 MG PO TABS
75.0000 mg | ORAL_TABLET | Freq: Every day | ORAL | Status: DC
  Administered 2014-07-19: 75 mg via ORAL
  Filled 2014-07-18: qty 1

## 2014-07-18 MED ORDER — MORPHINE SULFATE 2 MG/ML IJ SOLN: 2.0000 mg | INTRAMUSCULAR | Status: DC | PRN

## 2014-07-18 MED ORDER — DORZOLAMIDE HCL-TIMOLOL MAL 2-0.5 % OP SOLN
1.0000 [drp] | Freq: Two times a day (BID) | OPHTHALMIC | Status: DC
  Administered 2014-07-18 – 2014-07-19 (×2): 1 [drp] via OPHTHALMIC
  Filled 2014-07-18: qty 10

## 2014-07-18 MED ORDER — QUETIAPINE FUMARATE 25 MG PO TABS
50.0000 mg | ORAL_TABLET | Freq: Three times a day (TID) | ORAL | Status: DC
  Administered 2014-07-18 – 2014-07-19 (×3): 50 mg via ORAL
  Filled 2014-07-18 (×3): qty 2

## 2014-07-18 MED ORDER — ONDANSETRON HCL 4 MG/2ML IJ SOLN: 4.0000 mg | Freq: Four times a day (QID) | INTRAMUSCULAR | Status: DC | PRN

## 2014-07-18 MED ORDER — SERTRALINE HCL 25 MG PO TABS: 25.0000 mg | ORAL_TABLET | Freq: Every day | ORAL | Status: DC

## 2014-07-18 MED ORDER — LATANOPROST 0.005 % OP SOLN
1.0000 [drp] | Freq: Every day | OPHTHALMIC | Status: DC
  Administered 2014-07-18: 1 [drp] via OPHTHALMIC
  Filled 2014-07-18: qty 2.5

## 2014-07-18 MED ORDER — METFORMIN HCL 500 MG PO TABS
500.0000 mg | ORAL_TABLET | Freq: Two times a day (BID) | ORAL | Status: DC
  Administered 2014-07-19 (×2): 500 mg via ORAL
  Filled 2014-07-18 (×2): qty 1

## 2014-07-18 MED ORDER — CARBIDOPA-LEVODOPA-ENTACAPONE 12.5-50-200 MG PO TABS: 1.0000 | ORAL_TABLET | Freq: Three times a day (TID) | ORAL | Status: DC

## 2014-07-18 MED ORDER — ZOLPIDEM TARTRATE 5 MG PO TABS
5.0000 mg | ORAL_TABLET | Freq: Every evening | ORAL | Status: DC | PRN
  Administered 2014-07-18: 5 mg via ORAL
  Filled 2014-07-18: qty 1

## 2014-07-18 MED ORDER — CARBIDOPA-LEVODOPA 25-100 MG PO TABS
0.5000 | ORAL_TABLET | Freq: Three times a day (TID) | ORAL | Status: DC
  Administered 2014-07-18 – 2014-07-19 (×3): 0.5 via ORAL
  Filled 2014-07-18 (×3): qty 1

## 2014-07-18 MED ORDER — BENZTROPINE MESYLATE 0.5 MG PO TABS
1.0000 mg | ORAL_TABLET | Freq: Three times a day (TID) | ORAL | Status: DC
  Administered 2014-07-18 – 2014-07-19 (×3): 1 mg via ORAL
  Filled 2014-07-18 (×3): qty 2

## 2014-07-18 NOTE — ED Provider Notes (Signed)
CSN: [id number]     Arrival date & time 07/18/14  1827 History   First MD Initiated Contact with Patient 07/18/14 2005     Chief Complaint  Patient presents with  . Chest Pain     (Consider location/radiation/quality/duration/timing/severity/associated sxs/prior Treatment) HPI Comments: The patient is an 81 year old male, history of coronary artery disease status post bypass grafting 5 vessels, Dr. SwazilandJordan is his cardiologist. The patient takes Plavix every day, he states that when he woke up at 10:00 this morning he was having pain in the left side of his chest, a cramping feeling across the left side of his chest which has been coming and going throughout the day, he states that the patient feels very similar to when he had his heart problems in the past. He denies shortness of breath nausea fevers chills coughing or diarrhea. His symptoms are currently resolved, nothing seems to make this better or worse, he did not have any exertion today.  Patient is a 81 y.o. male presenting with chest pain. The history is provided by the patient and the spouse.  Chest Pain   Past Medical History  Diagnosis Date  . Coronary artery disease   . Diabetes mellitus   . Hypertension   . Hyperlipidemia   . CVD (cerebrovascular disease)   . Gout   . Glaucoma   . Paranoid schizophrenia   . Tremor   . History of orthostatic hypotension   . Dyspnea   . Diastolic dysfunction   . Fracture, humerus     right  . Parkinsonism 12/02/2012  . Abnormality of gait 12/02/2012  . Obesity   . PNA (pneumonia) May 2015  . Dysphagia, pharyngoesophageal phase 06/08/2014    Thickened fluids   Past Surgical History  Procedure Laterality Date  . Cardiac catheterization  08/31/2006    MODERATE INFERIOR WALL HYPOKINESIA WITH EF 45%  . Coronary artery bypass graft  2008    LIMA GRAFT TO THE LAD, SAPHENOUS VEIN GRAFT TO THE DIAGONAL , SEQUENTIAL SAPHENOUS VEIN GRAFT TO THE PDA, POSTERIOR LATERAL , AND OBTUSE MARGINAL  VESSELS.  . Tonsillectomy    . Asd repair    . Cholecystectomy     Family History  Problem Relation Age of Onset  . Diabetes Father   . Cystic fibrosis Father   . Heart disease Father    History  Substance Use Topics  . Smoking status: Former Smoker    Quit date: 03/31/1958  . Smokeless tobacco: Never Used     Comment: quit 1960's  . Alcohol Use: No    Review of Systems  Cardiovascular: Positive for chest pain.  All other systems reviewed and are negative.     Allergies  Review of patient's allergies indicates no known allergies.  Home Medications   Prior to Admission medications   Medication Sig Start Date End Date Taking? Authorizing Provider  allopurinol (ZYLOPRIM) 300 MG tablet Take 300 mg by mouth every evening.    Yes Historical Provider, MD  benztropine (COGENTIN) 1 MG tablet Take 1 mg by mouth 3 (three) times daily.   Yes Historical Provider, MD  brimonidine (ALPHAGAN) 0.2 % ophthalmic solution Place 1 drop into both eyes 3 (three) times daily.  07/17/14  Yes Historical Provider, MD  carbidopa-levodopa-entacapone (STALEVO 50) 12.5-50-200 MG per tablet Take 1 tablet by mouth 3 (three) times daily. 06/08/14  Yes York Spanielharles K Willis, MD  carvedilol (COREG) 12.5 MG tablet TAKE 1/2 TABLET BY MOUTH TWICE A DAY 03/30/14  Yes Lawson FiscalLori  Temple Pacini, NP  clopidogrel (PLAVIX) 75 MG tablet TAKE ONE TABLET BY MOUTH ONCE DAILY 06/29/14  Yes York Spaniel, MD  dorzolamide-timolol (COSOPT) 22.3-6.8 MG/ML ophthalmic solution Place 1 drop into both eyes 2 (two) times daily.   Yes Historical Provider, MD  furosemide (LASIX) 40 MG tablet Take 40 mg by mouth daily.   Yes Historical Provider, MD  glipiZIDE-metformin (METAGLIP) 5-500 MG per tablet Take 1 tablet by mouth 2 (two) times daily. 10/26/13  Yes Historical Provider, MD  losartan (COZAAR) 100 MG tablet Take 1 tablet (100 mg total) by mouth daily. 06/30/13  Yes Rosalio Macadamia, NP  pantoprazole (PROTONIX) 40 MG tablet Take 40 mg by mouth  daily.   Yes Historical Provider, MD  polyethylene glycol (MIRALAX / GLYCOLAX) packet Take 17 g by mouth daily as needed. For constipation   Yes Historical Provider, MD  potassium chloride 20 MEQ/15ML (10%) SOLN Take 15 mLs (20 mEq total) by mouth 3 (three) times daily. 03/17/14  Yes Peter M Swaziland, MD  QUEtiapine (SEROQUEL) 50 MG tablet Take 50 mg by mouth 3 (three) times daily.    Yes Historical Provider, MD  simvastatin (ZOCOR) 20 MG tablet Take 1 tablet (20 mg total) by mouth daily at 6 PM. Patient taking differently: Take 20 mg by mouth daily at 2 PM daily at 2 PM.  04/07/14  Yes Peter M Swaziland, MD  travoprost, benzalkonium, (TRAVATAN) 0.004 % ophthalmic solution Place 1 drop into both eyes at bedtime.     Yes Historical Provider, MD  vitamin E (ALPH-E) 400 UNIT capsule Take 400 Units by mouth 2 (two) times daily.    Yes Historical Provider, MD   BP 143/89 mmHg  Pulse 99  Temp(Src) 98 F (36.7 C) (Oral)  Resp 19  Ht  (1.702 m)  Wt 217 lb 2.5 oz (98.5 kg)  BMI 34.00 kg/m2  SpO2 99% Physical Exam  Constitutional: He appears well-developed and well-nourished. No distress.  HENT:  Head: Normocephalic and atraumatic.  Mouth/Throat: Oropharynx is clear and moist. No oropharyngeal exudate.  Eyes: Conjunctivae and EOM are normal. Pupils are equal, round, and reactive to light. Right eye exhibits no discharge. Left eye exhibits no discharge. No scleral icterus.  Neck: Normal range of motion. Neck supple. No JVD present. No thyromegaly present.  Cardiovascular: Normal rate, regular rhythm, normal heart sounds and intact distal pulses.  Exam reveals no gallop and no friction rub.   No murmur heard. Pulmonary/Chest: Effort normal and breath sounds normal. No respiratory distress. He has no wheezes. He has no rales.  Abdominal: Soft. Bowel sounds are normal. He exhibits no distension and no mass. There is no tenderness.  Musculoskeletal: Normal range of motion. He exhibits no edema or  tenderness.  Lymphadenopathy:    He has no cervical adenopathy.  Neurological: He is alert. Coordination normal.  Skin: Skin is warm and dry. No rash noted. No erythema.  Psychiatric: He has a normal mood and affect. His behavior is normal.  Nursing note and vitals reviewed.   ED Course  Procedures (including critical care time) Labs Review Labs Reviewed  CBC - Abnormal; Notable for the following:    RBC 3.52 (*)    Hemoglobin 11.0 (*)    HCT 33.9 (*)    All other components within normal limits  BASIC METABOLIC PANEL - Abnormal; Notable for the following:    Sodium 133 (*)    Glucose, Bld 174 (*)    BUN 27 (*)  Creatinine, Ser 1.45 (*)    GFR calc non Af Amer 45 (*)    GFR calc Af Amer 52 (*)    All other components within normal limits  CBC - Abnormal; Notable for the following:    RBC 3.53 (*)    Hemoglobin 11.0 (*)    HCT 34.1 (*)    All other components within normal limits  CREATININE, SERUM - Abnormal; Notable for the following:    Creatinine, Ser 1.40 (*)    GFR calc non Af Amer 47 (*)    GFR calc Af Amer 54 (*)    All other components within normal limits  TROPONIN I  TROPONIN I  TROPONIN I  Rosezena Sensor, ED    Imaging Review Dg Chest Port 1 View  07/18/2014   CLINICAL DATA:  Central chest pain beginning this morning. Dull pain. Previous CABG.  EXAM: PORTABLE CHEST - 1 VIEW  COMPARISON:  08/08/2013  FINDINGS: Artifact overlies the chest. There has been previous median sternotomy and CABG. The heart is enlarged. There is calcification and unfolding of the thoracic aorta. There is pulmonary venous hypertension without frank edema. No infiltrate, collapse or visible effusion. No acute bony finding.  IMPRESSION: Previous CABG. Cardiomegaly. Aortic atherosclerosis. Pulmonary venous hypertension.   Electronically Signed   By: Paulina Fusi M.D.   On: 07/18/2014 21:09     EKG Interpretation   Date/Time:  Tuesday July 18 2014 18:35:39 EDT Ventricular Rate:   82 PR Interval:  177 QRS Duration: 180 QT Interval:  423 QTC Calculation: 494 R Axis:   -5 Text Interpretation:  Sinus rhythm Left bundle branch block Baseline  wander in lead(s) V1 V3 since last tracing no significant change Confirmed  by Tasmia Blumer  MD, Reza Crymes ([id number]) on 07/18/2014 8:23:22 PM      MDM   Final diagnoses:  Chest pain, unspecified chest pain type  Renal insufficiency  Chest pain    Creatinine of 1.45, otherwise normal troponin, EKG which shows left bundle branch block, slightly wider QRS than prior, will discuss with admitting team as the patient likely needs a rule out admission.  Pt high risk for recurrent ACS, D/w Dr. Welton Flakes who will admit.    Eber Hong, MD 07/19/14 1121

## 2014-07-18 NOTE — Telephone Encounter (Signed)
Terrance Perez is calling because Terrance Perez states he is having some Chest Pains . Please call .    Thanks

## 2014-07-18 NOTE — ED Notes (Signed)
Pt presents with c/o chest pain, centralized, that started this morning. Pt reports the pain is dull. Pt reports he has had this pain before which went away with gas-x but this pain did not go away. Pt has a hx of previous MI. No shortness of breath.

## 2014-07-18 NOTE — H&P (Signed)
[facility] History and Physical  Terrance SabalStephen R Birt RUE:[id number] DOB: 1933-10-04 DOA: 07/18/2014  Referring physician: Eber HongBrian Miller, MD PCP: Minda MeoARONSON,RICHARD A, MD   Chief Complaint: Chest Pain  HPI: Terrance Perez is a 81 y.o. male presents with chest pain. The pain started aroudn 10AM. Patient states that the pain was located in the upper elly lower central chest. Patient states that pain does not seem to radiate anywhere. Patient states that there is no shortness of breath noted with the pain. There is some radiation down the arm at times. He states that he has had some numbness in the arm but this has been present for years. Apparently he has had a heart attack in 2008 and had a CABG done at that time. Patient has had an echo done last year and this shows an EF of about 35-40%. He was last seen by cardiology in January of this year. Currently he states that the pain has completely resolved.   Review of Systems:  12 point ROS done and is unremarkable other than noted in HPI  Past Medical History  Diagnosis Date  . Coronary artery disease   . Diabetes mellitus   . Hypertension   . Hyperlipidemia   . CVD (cerebrovascular disease)   . Gout   . Glaucoma   . Paranoid schizophrenia   . Tremor   . History of orthostatic hypotension   . Dyspnea   . Diastolic dysfunction   . Fracture, humerus     right  . Parkinsonism 12/02/2012  . Abnormality of gait 12/02/2012  . Obesity   . PNA (pneumonia) May 2015  . Dysphagia, pharyngoesophageal phase 06/08/2014    Thickened fluids   Past Surgical History  Procedure Laterality Date  . Cardiac catheterization  08/31/2006    MODERATE INFERIOR WALL HYPOKINESIA WITH EF 45%  . Coronary artery bypass graft  2008    LIMA GRAFT TO THE LAD, SAPHENOUS VEIN GRAFT TO THE DIAGONAL , SEQUENTIAL SAPHENOUS VEIN GRAFT TO THE PDA, POSTERIOR LATERAL , AND OBTUSE MARGINAL VESSELS.  . Tonsillectomy    . Asd repair    . Cholecystectomy     Social  History:  reports that he quit smoking about 56 years ago. He has never used smokeless tobacco. He reports that he does not drink alcohol or use illicit drugs.  No Known Allergies  Family History  Problem Relation Age of Onset  . Diabetes Father   . Cystic fibrosis Father   . Heart disease Father      Prior to Admission medications   Medication Sig Start Date End Date Taking? Authorizing Provider  allopurinol (ZYLOPRIM) 300 MG tablet Take 300 mg by mouth every evening.     Historical Provider, MD  benztropine (COGENTIN) 1 MG tablet Take 1 mg by mouth 3 (three) times daily.    Historical Provider, MD  brimonidine (ALPHAGAN P) 0.1 % SOLN Place 1 drop into both eyes 2 (two) times daily.    Historical Provider, MD  carbidopa-levodopa-entacapone (STALEVO 50) 12.5-50-200 MG per tablet Take 1 tablet by mouth 3 (three) times daily. 06/08/14   York Spanielharles K Willis, MD  carvedilol (COREG) 12.5 MG tablet TAKE 1/2 TABLET BY MOUTH TWICE A DAY 03/30/14   Rosalio MacadamiaLori C Gerhardt, NP  clopidogrel (PLAVIX) 75 MG tablet Take 75 mg by mouth daily with breakfast.    Historical Provider, MD  clopidogrel (PLAVIX) 75 MG tablet TAKE ONE TABLET BY MOUTH ONCE DAILY 06/29/14   York Spanielharles K Willis, MD  clotrimazole-betamethasone (  LOTRISONE) cream Apply 1 application topically as needed. 10/07/13   Historical Provider, MD  dorzolamide-timolol (COSOPT) 22.3-6.8 MG/ML ophthalmic solution Place 1 drop into both eyes 2 (two) times daily.    Historical Provider, MD  furosemide (LASIX) 40 MG tablet Take 40 mg by mouth daily.    Historical Provider, MD  glipiZIDE-metformin (METAGLIP) 5-500 MG per tablet Take 1 tablet by mouth 2 (two) times daily. 10/26/13   Historical Provider, MD  losartan (COZAAR) 100 MG tablet Take 1 tablet (100 mg total) by mouth daily. 06/30/13   Rosalio Macadamia, NP  pantoprazole (PROTONIX) 40 MG tablet Take 40 mg by mouth daily.    Historical Provider, MD  polyethylene glycol (MIRALAX / GLYCOLAX) packet Take 17 g by mouth  daily as needed. For constipation    Historical Provider, MD  potassium chloride 20 MEQ/15ML (10%) SOLN Take 15 mLs (20 mEq total) by mouth 3 (three) times daily. 03/17/14   Peter M Sw[place], MD  QUEtiapine (SEROQUEL) 50 MG tablet Take 50 mg by mouth 3 (three) times daily.     Historical Provider, MD  sertraline (ZOLOFT) 25 MG tablet Take 25 mg by mouth at bedtime. Take 1 25 mg by mouth at bedtime. 11/10/12   Historical Provider, MD  simvastatin (ZOCOR) 20 MG tablet Take 1 tablet (20 mg total) by mouth daily at 6 PM. 04/07/14   Peter M [place]az[place], MD  travoprost, benzalkonium, (TRAVATAN) 0.004 % ophthalmic solution Place 1 drop into both eyes at bedtime.      Historical Provider, MD  vitamin E (ALPH-E) 400 UNIT capsule Take 400 Units by mouth 2 (two) times daily.     Historical Provider, MD   Physical Exam: Filed Vitals:   07/18/14 1837  BP: 138/64  Pulse: 80  Temp: 97.7 F (36.5 C)  TempSrc: Oral  Resp: 25  SpO2: 100%    Wt Readings from Last 3 Encounters:  06/08/14 94.348 kg (208 lb)  04/14/14 96.843 kg (213 lb 8 oz)  12/09/13 95.074 kg (209 lb 9.6 oz)    General:  Appears calm and comfortable Eyes: PERRL, normal lids, irises & conjunctiva ENT: grossly normal hearing, lips & tongue Neck: no LAD, masses or thyromegaly Cardiovascular: RRR, no m/r/g. +LE edema. Respiratory: CTA bilaterally, no w/r/r. Normal respiratory effort. Abdomen: soft, ntnd Skin: no rash or induration seen on limited exam Musculoskeletal: grossly normal tone BUE/BLE Psychiatric: grossly normal mood and affect, speech fluent and appropriate Neurologic: grossly non-focal. Has resting tremor of Parkinsons          Labs on Admission:  Basic Metabolic Panel:  Recent Labs Lab 07/18/14 1913  NA 133*  K 4.1  CL 97  CO2 26  GLUCOSE 174*  BUN 27*  CREATININE 1.45*  CALCIUM 8.7   Liver Function Tests: No results for input(s): AST, ALT, ALKPHOS, BILITOT, PROT, ALBUMIN in the last 168 hours. No results for  input(s): LIPASE, AMYLASE in the last 168 hours. No results for input(s): AMMONIA in the last 168 hours. CBC:  Recent Labs Lab 07/18/14 1913  WBC 7.2  HGB 11.0*  HCT 33.9*  MCV 96.3  PLT 237   Cardiac Enzymes: No results for input(s): CKTOTAL, CKMB, CKMBINDEX, TROPONINI in the last 168 hours.  BNP (last 3 results) No results for input(s): BNP in the last 8760 hours.  ProBNP (last 3 results) No results for input(s): PROBNP in the last 8760 hours.  CBG: No results for input(s): GLUCAP in the last 168 hours.  Radiological Exams  on Admission: No results found.    Assessment/Plan Principal Problem:   Chest pain Active Problems:   Hypertension   Hyperlipidemia   Diabetes mellitus type II, non insulin dependent   Parkinsonism   1. Chest Pain -will admit to telemetry -will check serial enzymes -currently he is pain free -will get echo in am las echo in 2015 shows EF 35-40% -cardiology consult in am  2. HTN -will monitor pressures -continue with home medications as ordered  3. Hyperlipidemia -on statins will continue  4. Diabetes Mellitus -check A1C -will monitor FSBS -continue oral Agents insulin coverage as needed  5. Parkinson Disease -continue with home medications  6. Elevated Creatinine -possible dehydration? -will hold lasix for now and hold potassium for now -recheck labs in am   Code Status: Full Code (must indicate code status--if unknown or must be presumed, indicate so) DVT Prophylaxis:Plavix Family Communication:None  (indicate person spoken with, if applicable, with phone number if by telephone) Disposition Plan: Home (indicate anticipated LOS)  Time spent:  [facility] Pager [phone]

## 2014-07-18 NOTE — ED Notes (Signed)
MD at bedside. 

## 2014-07-18 NOTE — Telephone Encounter (Signed)
Returned call to patient he stated he has been having chest pain since 1:00 pm this afternoon.Stated he is still having chest pain.Rates pain # 6.Stated his wife on her way home.Spoke to wife advised patient needs to go to ER.[place] [facility].

## 2014-07-19 DIAGNOSIS — I5022 Chronic systolic (congestive) heart failure: Secondary | ICD-10-CM

## 2014-07-19 DIAGNOSIS — E785 Hyperlipidemia, unspecified: Secondary | ICD-10-CM | POA: Diagnosis not present

## 2014-07-19 DIAGNOSIS — R079 Chest pain, unspecified: Secondary | ICD-10-CM

## 2014-07-19 DIAGNOSIS — I10 Essential (primary) hypertension: Secondary | ICD-10-CM

## 2014-07-19 DIAGNOSIS — G20 Parkinson's disease: Secondary | ICD-10-CM | POA: Diagnosis not present

## 2014-07-19 LAB — TROPONIN I: Troponin I: <0.03 ng/mL (ref <0.031)

## 2014-07-19 MED ORDER — ISOSORBIDE MONONITRATE ER 30 MG PO TB24: 15.0000 mg | ORAL_TABLET | Freq: Every day | ORAL | Status: DC

## 2014-07-19 MED ORDER — ISOSORBIDE MONONITRATE ER 30 MG PO TB24
15.0000 mg | ORAL_TABLET | Freq: Every day | ORAL | Status: DC
  Administered 2014-07-19: 15 mg via ORAL
  Filled 2014-07-19: qty 1

## 2014-07-19 NOTE — Discharge Summary (Signed)
Physician Discharge Summary  Terrance Perez:[id number] DOB: 07-15-1933 DOA: 07/18/2014  PCP: Minda Meo, MD  Admit date: 07/18/2014 Discharge date: 07/19/2014  Time spent:55 minutes    Discharge Condition: stable  Diet recommendation: Low-sodium heart healthy diabetic diet  Discharge Diagnoses:  Principal Problem:   Chest pain Active Problems:   Hypertension   Hyperlipidemia   Diabetes mellitus type II, non insulin dependent   Parkinsonism CAD status post CABG   History of present illness:  This is 81 year old male with hypertension hyperlipidemia, diabetes mellitus, parkinsonism, ASD repair and coronary artery disease status post CABG in 2008. The patient presented with chest pain that started at 10:00 in the morning on the day of admission it started as a heavy or expanding feeling in his upper abdomen and then radiated to the right side and then the left side of his chest he did not feel it radiated to his arms. He felt this was the same kind of pain he had when he had an MI. It continued all day and faded away on the evening of admission. He was admitted for further workup.  Hospital Course:   Chest pain -Overall a very poor historian-chest pain resolved yesterday evening- cardiac enzymes have been negative-EKG is unchanged from his prior  -Myoview in 2012 revealed mild apical ischemia -he has been evaluated by cardiology (Dr Armanda Magic) and according to her note conservative management is recommended Imdur15 mg daily has been added -He has seen Dr. Swaziland as an outpatient earlier this year -Continue Plavix-statin-beta blocker  Chronic systolic heart failure -Last echo in 2015 revealed EF of 30-35%  -He is euvolemic  Hypertension -BP reasonably controlled  Procedures:  None  Consultations:  Cardiology  Discharge Exam: Filed Weights   07/18/14 2121  Weight: 98.5 kg (217 lb 2.5 oz)   Filed Vitals:   07/19/14 1606  BP: 126/79  Pulse: 83   Temp:   Resp:     General: AAO x 3, no distress Cardiovascular: RRR, no murmurs  Respiratory: clear to auscultation bilaterally GI: soft, non-tender, non-distended, bowel sound positive  Discharge Instructions You were cared for by a hospitalist during your hospital stay. If you have any questions about your discharge medications or the care you received while you were in the hospital after you are discharged, you can call the unit and asked to speak with the hospitalist on call if the hospitalist that took care of you is not available. Once you are discharged, your primary care physician will handle any further medical issues. Please note that NO REFILLS for any discharge medications will be authorized once you are discharged, as it is imperative that you return to your primary care physician (or establish a relationship with a primary care physician if you do not have one) for your aftercare needs so that they can reassess your need for medications and monitor your lab values.  Discharge Instructions    Discharge instructions    Complete by:  As directed   Continue a diabetic and low sodium, heart healthy diet.     Increase activity slowly    Complete by:  As directed             Medication List    TAKE these medications        allopurinol 300 MG tablet  Commonly known as:  ZYLOPRIM  Take 300 mg by mouth every evening.     ALPH-E 400 UNIT capsule  Generic drug:  vitamin E  Take 400  Units by mouth 2 (two) times daily.     benztropine 1 MG tablet  Commonly known as:  COGENTIN  Take 1 mg by mouth 3 (three) times daily.     brimonidine 0.2 % ophthalmic solution  Commonly known as:  ALPHAGAN  Place 1 drop into both eyes 3 (three) times daily.     carbidopa-levodopa-entacapone 12.5-50-200 MG per tablet  Commonly known as:  STALEVO 50  Take 1 tablet by mouth 3 (three) times daily.     carvedilol 12.5 MG tablet  Commonly known as:  COREG  TAKE 1/2 TABLET BY MOUTH TWICE A  DAY     clopidogrel 75 MG tablet  Commonly known as:  PLAVIX  TAKE ONE TABLET BY MOUTH ONCE DAILY     dorzolamide-timolol 22.3-6.8 MG/ML ophthalmic solution  Commonly known as:  COSOPT  Place 1 drop into both eyes 2 (two) times daily.     furosemide 40 MG tablet  Commonly known as:  LASIX  Take 40 mg by mouth daily.     glipiZIDE-metformin 5-500 MG per tablet  Commonly known as:  METAGLIP  Take 1 tablet by mouth 2 (two) times daily.     isosorbide mononitrate 30 MG 24 hr tablet  Commonly known as:  IMDUR  Take 0.5 tablets (15 mg total) by mouth daily.     losartan 100 MG tablet  Commonly known as:  COZAAR  Take 1 tablet (100 mg total) by mouth daily.     pantoprazole 40 MG tablet  Commonly known as:  PROTONIX  Take 40 mg by mouth daily.     polyethylene glycol packet  Commonly known as:  MIRALAX / GLYCOLAX  Take 17 g by mouth daily as needed. For constipation     potassium chloride 20 MEQ/15ML (10%) Soln  Take 15 mLs (20 mEq total) by mouth 3 (three) times daily.     QUEtiapine 50 MG tablet  Commonly known as:  SEROQUEL  Take 50 mg by mouth 3 (three) times daily.     simvastatin 20 MG tablet  Commonly known as:  ZOCOR  Take 1 tablet (20 mg total) by mouth daily at 6 PM.     travoprost (benzalkonium) 0.004 % ophthalmic solution  Commonly known as:  TRAVATAN  Place 1 drop into both eyes at bedtime.       No Known Allergies     Follow-up Information    Follow up with ARONSON,RICHARD A, MD. Call today.   Specialty:  Internal Medicine   Contact information:   [address] [place] [address] [phone]        The results of significant diagnostics from this hospitalization (including imaging, microbiology, ancillary and laboratory) are listed below for reference.    Significant Diagnostic Studies: Dg Chest Port 1 View  07/18/2014   CLINICAL DATA:  Central chest pain beginning this morning. Dull pain. Previous CABG.  EXAM: PORTABLE CHEST - 1 VIEW   COMPARISON:  08/08/2013  FINDINGS: Artifact overlies the chest. There has been previous median sternotomy and CABG. The heart is enlarged. There is calcification and unfolding of the thoracic aorta. There is pulmonary venous hypertension without frank edema. No infiltrate, collapse or visible effusion. No acute bony finding.  IMPRESSION: Previous CABG. Cardiomegaly. Aortic atherosclerosis. Pulmonary venous hypertension.   Electronically Signed   By: Paulina Fusi M.D.   On: 07/18/2014 21:09    Microbiology: No results found for this or any previous visit (from the past 240 hour(s)).  Labs: Basic Metabolic Panel:  Recent Labs Lab 07/18/14 1913 07/18/14 2216  NA 133*  --   K 4.1  --   CL 97  --   CO2 26  --   GLUCOSE 174*  --   BUN 27*  --   CREATININE 1.45* 1.40*  CALCIUM 8.7  --    Liver Function Tests: No results for input(s): AST, ALT, ALKPHOS, BILITOT, PROT, ALBUMIN in the last 168 hours. No results for input(s): LIPASE, AMYLASE in the last 168 hours. No results for input(s): AMMONIA in the last 168 hours. CBC:  Recent Labs Lab 07/18/14 1913 07/18/14 2216  WBC 7.2 8.4  HGB 11.0* 11.0*  HCT 33.9* 34.1*  MCV 96.3 96.6  PLT 237 205   Cardiac Enzymes:  Recent Labs Lab 07/18/14 2216 07/19/14 0055 07/19/14 0325  TROPONINI <0.03 <0.03 <0.03   BNP: BNP (last 3 results) No results for input(s): BNP in the last 8760 hours.  ProBNP (last 3 results) No results for input(s): PROBNP in the last 8760 hours.  CBG: No results for input(s): GLUCAP in the last 168 hours.     SignedCalvert Cantor:  Tynasia Mccaul, MD [facility] 07/19/2014, 5:59 PM

## 2014-07-19 NOTE — Progress Notes (Signed)
UR completed 

## 2014-07-19 NOTE — Consult Note (Signed)
Admit date: 07/18/2014 Referring Physician  Dr. Laural BenesJohnson Primary Physician  Dr. Geoffry Paradiseichard Aronson Primary Cardiologist  Dr. Peter SwazilandJordan Reason for Consultation  Chest pain  HPI: Terrance Perez is a 81 y.o. male with history of ASCAD s/p CABG in 2008, ASD repair, Parkinson's, Paranoid Schizophrenia, DM and HTN who presents with chest pain. The pain started around 10AM yesterday. Patient told the admitting MD that the pain was located in the upper belly lower central chest. Patient seemed to change his story during the interview.  He initially said that the pain started on the right side and went across to the left.  This is different from what he told the admitting MD.  This pain is a pressure that is similar to his pain prior to his CABG.  He has chronic CP that is sharp and occurs at multiple places on his chest. Patient states that there is no shortness of breath noted with the pain. He states that he has had some numbness in the arm but this has been present for years. Apparently he has had a heart attack in 2008 and had a CABG done at that time. Patient has had an echo done last year and this shows an EF of about 35-40%. He was last seen by cardiology in January of this year. Currently he states that the pain has completely resolved.  Cardiology is now asked to consult.  He is a poor historian and difficult to get an accurate history. He says that usually the sharp pains that he has in his chest go away after taking GasX but this time the pain lasted a much longer time and he got worried and went to the ER.  By the time he got to the ER his pain was going away.       PMH:   Past Medical History  Diagnosis Date  . Coronary artery disease   . Diabetes mellitus   . Hypertension   . Hyperlipidemia   . CVD (cerebrovascular disease)   . Gout   . Glaucoma   . Paranoid schizophrenia   . Tremor   . History of orthostatic hypotension   . Dyspnea   . Diastolic dysfunction   . Fracture,  humerus     right  . Parkinsonism 12/02/2012  . Abnormality of gait 12/02/2012  . Obesity   . PNA (pneumonia) May 2015  . Dysphagia, pharyngoesophageal phase 06/08/2014    Thickened fluids     PSH:   Past Surgical History  Procedure Laterality Date  . Cardiac catheterization  08/31/2006    MODERATE INFERIOR WALL HYPOKINESIA WITH EF 45%  . Coronary artery bypass graft  2008    LIMA GRAFT TO THE LAD, SAPHENOUS VEIN GRAFT TO THE DIAGONAL , SEQUENTIAL SAPHENOUS VEIN GRAFT TO THE PDA, POSTERIOR LATERAL , AND OBTUSE MARGINAL VESSELS.  . Tonsillectomy    . Asd repair    . Cholecystectomy      Allergies:  Review of patient's allergies indicates no known allergies. Prior to Admit Meds:   Prescriptions prior to admission  Medication Sig Dispense Refill Last Dose  . allopurinol (ZYLOPRIM) 300 MG tablet Take 300 mg by mouth every evening.    07/18/2014 at Unknown time  . benztropine (COGENTIN) 1 MG tablet Take 1 mg by mouth 3 (three) times daily.   07/18/2014 at Unknown time  . brimonidine (ALPHAGAN) 0.2 % ophthalmic solution Place 1 drop into both eyes 3 (three) times daily.    07/18/2014  .  carbidopa-levodopa-entacapone (STALEVO 50) 12.5-50-200 MG per tablet Take 1 tablet by mouth 3 (three) times daily. 90 tablet 2 07/18/2014 at pm  . carvedilol (COREG) 12.5 MG tablet TAKE 1/2 TABLET BY MOUTH TWICE A DAY 30 tablet 3 07/18/2014 at Unknown time  . clopidogrel (PLAVIX) 75 MG tablet TAKE ONE TABLET BY MOUTH ONCE DAILY 90 tablet 4 07/18/2014 at Unknown time  . dorzolamide-timolol (COSOPT) 22.3-6.8 MG/ML ophthalmic solution Place 1 drop into both eyes 2 (two) times daily.   07/18/2014 at am  . furosemide (LASIX) 40 MG tablet Take 40 mg by mouth daily.   07/18/2014 at Unknown time  . glipiZIDE-metformin (METAGLIP) 5-500 MG per tablet Take 1 tablet by mouth 2 (two) times daily.   07/18/2014 at am  . losartan (COZAAR) 100 MG tablet Take 1 tablet (100 mg total) by mouth daily. 30 tablet 6 07/18/2014 at Unknown time  .  pantoprazole (PROTONIX) 40 MG tablet Take 40 mg by mouth daily.   07/18/2014 at Unknown time  . polyethylene glycol (MIRALAX / GLYCOLAX) packet Take 17 g by mouth daily as needed. For constipation   unknown  . potassium chloride 20 MEQ/15ML (10%) SOLN Take 15 mLs (20 mEq total) by mouth 3 (three) times daily. 450 mL 5 07/18/2014 at Unknown time  . QUEtiapine (SEROQUEL) 50 MG tablet Take 50 mg by mouth 3 (three) times daily.    07/18/2014 at Unknown time  . simvastatin (ZOCOR) 20 MG tablet Take 1 tablet (20 mg total) by mouth daily at 6 PM. (Patient taking differently: Take 20 mg by mouth daily at 2 PM daily at 2 PM. ) 30 tablet 6 07/18/2014 at Unknown time  . travoprost, benzalkonium, (TRAVATAN) 0.004 % ophthalmic solution Place 1 drop into both eyes at bedtime.     07/17/2014 at Unknown time  . vitamin E (ALPH-E) 400 UNIT capsule Take 400 Units by mouth 2 (two) times daily.    07/18/2014 at am   Fam HX:    Family History  Problem Relation Age of Onset  . Diabetes Father   . Cystic fibrosis Father   . Heart disease Father    Social HX:    History   Social History  . Marital Status: Married    Spouse Name: Corrie Dandy  . Number of Children: 2  . Years of Education: GED   Occupational History  . retired     retired   Social History Main Topics  . Smoking status: Former Smoker    Quit date: 03/31/1958  . Smokeless tobacco: Never Used     Comment: quit 1960's  . Alcohol Use: No  . Drug Use: No  . Sexual Activity: No   Other Topics Concern  . Not on file   Social History Narrative   Patient is left handed.   Patient occasionally drinks 1 cup of caffeine.     ROS:  All 11 ROS were addressed and are negative except what is stated in the HPI  Physical Exam: Blood pressure 125/70, pulse 85, temperature 98.2 F (36.8 C), temperature source Oral, resp. rate 16, height  (1.702 m), weight 217 lb 2.5 oz (98.5 kg), SpO2 94 %.    General: Well developed, well nourished, in no acute  distress Head: Eyes PERRLA, No xanthomas.   Normal cephalic and atramatic  Lungs:   Clear bilaterally to auscultation and percussion. Heart:   HRRR S1 S2 Pulses are 2+ & equal.  No carotid bruit. No JVD.  No abdominal bruits. No femoral bruits. Abdomen: Bowel sounds are positive, abdomen soft and non-tender without masses  Extremities:   No clubbing, cyanosis or edema.  DP +1 Neuro: Alert and oriented X 3. Psych:  Good affect, responds appropriately    Labs:   Lab Results  Component Value Date   WBC 8.4 07/18/2014   HGB 11.0* 07/18/2014   HCT 34.1* 07/18/2014   MCV 96.6 07/18/2014   PLT 205 07/18/2014    Recent Labs Lab 07/18/14 1913 07/18/14 2216  NA 133*  --   K 4.1  --   CL 97  --   CO2 26  --   BUN 27*  --   CREATININE 1.45* 1.40*  CALCIUM 8.7  --   GLUCOSE 174*  --    No results found for: PTT Lab Results  Component Value Date   INR 1.8* 09/03/2006   INR 1.1 08/30/2006   Lab Results  Component Value Date   CKTOTAL 40 08/06/2009   CKMB 1.2 08/06/2009   TROPONINI <0.03 07/19/2014     Lab Results  Component Value Date   CHOL  08/30/2006    191        ATP III CLASSIFICATION:  <200     mg/dL   Desirable  161-096  mg/dL   Borderline High  >=045    mg/dL   High   Lab Results  Component Value Date   HDL 29* 08/30/2006   Lab Results  Component Value Date   LDLCALC * 08/30/2006    123        Total Cholesterol/HDL:CHD Risk Coronary Heart Disease Risk Table                     Men   Women  1/2 Average Risk   3.4   3.3   Lab Results  Component Value Date   TRIG 196* 08/30/2006   Lab Results  Component Value Date   CHOLHDL 6.6 08/30/2006   No results found for: LDLDIRECT    Radiology:  Dg Chest Port 1 View  07/18/2014   CLINICAL DATA:  Central chest pain beginning this morning. Dull pain. Previous CABG.  EXAM: PORTABLE CHEST - 1 VIEW  COMPARISON:  08/08/2013  FINDINGS: Artifact overlies the chest. There has been previous median  sternotomy and CABG. The heart is enlarged. There is calcification and unfolding of the thoracic aorta. There is pulmonary venous hypertension without frank edema. No infiltrate, collapse or visible effusion. No acute bony finding.  IMPRESSION: Previous CABG. Cardiomegaly. Aortic atherosclerosis. Pulmonary venous hypertension.   Electronically Signed   By: Paulina Fusi M.D.   On: 07/18/2014 21:09    EKG:  NSR with LBBBB  ASSESSMENT/PLAN:  1.  Chest pain which is similar to his anginal pain prior to his CABG.  He is a poor historian and continually changed his story of what his chest pain was like.  Cardiac enzymes are negative.  EKG shows chronic LBBB which is unchanged.  Myoview in 2012 showed mild apical ischemia c/w known severe distal LAD disease and normal LVF.  Dr. Swaziland has recommended conservative management in the past.  Add Imdur  daily.  2.  ASCAD s/p remote CABG.  Continue Plavix/BB/statin.  He has not been on been on ASA. 3.  Moderate ischemic LV dysfunction with EF 35-40% by echo 05/2013 with chronic systolic CHF.  Appears euvolemic on exam.  Continue BB/Lasix/ARB 3.  DM 4.  HTN - controlled 5.  Dyslipidemia 7.  Paranoid schizophrenia with Parkinsons   Quintella Reichert, MD  07/19/2014  3:50 PM

## 2014-07-23 ENCOUNTER — Other Ambulatory Visit: Payer: Self-pay | Admitting: Nurse Practitioner

## 2014-07-26 ENCOUNTER — Other Ambulatory Visit: Payer: Self-pay | Admitting: Nurse Practitioner

## 2014-09-25 ENCOUNTER — Telehealth: Payer: Self-pay

## 2014-09-25 NOTE — Telephone Encounter (Signed)
Terrance Perez, pt's wife called stating that patient can only r/s to an afternoon appointment 3PM or later. Pt can no longer drive and she is the only one who can bring him. Is there anyway the patient can be worked in. Corrie DandyMary can be reached @ [phone] until 4:30 PM [phone] after 4:30PM.

## 2014-09-25 NOTE — Telephone Encounter (Signed)
Left voicemail asking patient call back to r/s 7/11 appointment. 

## 2014-09-25 NOTE — Telephone Encounter (Signed)
Patient can be schedule on 7/25. 

## 2014-09-26 NOTE — Telephone Encounter (Signed)
I called Terrance Perez. Appointment scheduled for 7/25 at 4 PM.

## 2014-10-09 ENCOUNTER — Ambulatory Visit: Payer: Medicare Other | Admitting: Neurology

## 2014-10-09 ENCOUNTER — Telehealth: Payer: Self-pay

## 2014-10-09 NOTE — Telephone Encounter (Signed)
I called the patient to r/s appointment on 7/25. Dr. Anne HahnWillis will not be able to be in the office after 4 PM. I asked that the patient call back to r/s.

## 2014-10-10 NOTE — Telephone Encounter (Signed)
I called the patient. He called back to r/s but was r/s in August. I left him a message stating I have several appointments available in July that I wanted to offer him. I asked that he call back and speak to me.

## 2014-10-16 ENCOUNTER — Ambulatory Visit (INDEPENDENT_AMBULATORY_CARE_PROVIDER_SITE_OTHER): Payer: Medicare Other | Admitting: Cardiology

## 2014-10-16 ENCOUNTER — Encounter: Payer: Self-pay | Admitting: Cardiology

## 2014-10-16 VITALS — BP 124/68 | HR 76 | Ht 67.0 in | Wt 207.0 lb

## 2014-10-16 DIAGNOSIS — I25119 Atherosclerotic heart disease of native coronary artery with unspecified angina pectoris: Secondary | ICD-10-CM

## 2014-10-16 DIAGNOSIS — I5032 Chronic diastolic (congestive) heart failure: Secondary | ICD-10-CM | POA: Diagnosis not present

## 2014-10-16 DIAGNOSIS — I10 Essential (primary) hypertension: Secondary | ICD-10-CM | POA: Diagnosis not present

## 2014-10-16 NOTE — Progress Notes (Signed)
Terrance SabalStephen R Perez Date of Birth: 12-Jun-1933 Medical Record #[id number]  History of Present Illness: [name] is seen for follow up of CHF.  He has known CAD with past CABG in 2008 by Dr. Laneta SimmersBartle. Myoview in October of 2012 showed mild apical ischemia consistent with known severe distal LAD disease - normal EF at 56%. He also has CHF with Echo in March 2015 showing an EF 35-40%. Other issues include DM, HTN, HLD, CVD, gout, paranoid schizophrenia, tremor, Parkinson's.   On follow up today he reports he is doing very well. Rare sharp electrical pains in his chest once or twice a month that are fleeting. Has occasional pains relieved with Gas X. Swelling in legs is staying down. No change in weight.   Current Outpatient Prescriptions  Medication Sig Dispense Refill  . allopurinol (ZYLOPRIM) 300 MG tablet Take 300 mg by mouth every evening.     . benztropine (COGENTIN) 1 MG tablet Take 1 mg by mouth 3 (three) times daily.    . brimonidine (ALPHAGAN) 0.2 % ophthalmic solution Place 1 drop into both eyes 3 (three) times daily.     . carbidopa-levodopa-entacapone (STALEVO 50) 12.5-50-200 MG per tablet Take 1 tablet by mouth 3 (three) times daily. 90 tablet 2  . carvedilol (COREG) 12.5 MG tablet TAKE 1/2 TABLET BY MOUTH TWICE A DAY 30 tablet 3  . clopidogrel (PLAVIX) 75 MG tablet TAKE ONE TABLET BY MOUTH ONCE DAILY 90 tablet 4  . dorzolamide-timolol (COSOPT) 22.3-6.8 MG/ML ophthalmic solution Place 1 drop into both eyes 2 (two) times daily.    . furosemide (LASIX) 40 MG tablet Take 40 mg by mouth daily.    [name]. glipiZIDE-metformin (METAGLIP) 5-500 MG per tablet Take 1 tablet by mouth 2 (two) times daily.    [name]. losartan (COZAAR) 100 MG tablet Take 1 tablet (100 mg total) by mouth daily. 30 tablet 6  . pantoprazole (PROTONIX) 40 MG tablet Take 40 mg by mouth daily.    . polyethylene glycol (MIRALAX / GLYCOLAX) packet Take 17 g by mouth daily as needed. For constipation    . potassium chloride 20  MEQ/15ML (10%) SOLN Take 15 mLs (20 mEq total) by mouth 3 (three) times daily. 450 mL 5  . QUEtiapine (SEROQUEL) 50 MG tablet Take 50 mg by mouth 3 (three) times daily.     . sertraline (ZOLOFT) 25 MG tablet Take 25 mg by mouth at bedtime.    . simvastatin (ZOCOR) 20 MG tablet Take 1 tablet (20 mg total) by mouth daily at 6 PM. (Patient taking differently: Take 20 mg by mouth daily at 2 PM daily at 2 PM. ) 30 tablet 6  . travoprost, benzalkonium, (TRAVATAN) 0.004 % ophthalmic solution Place 1 drop into both eyes at bedtime.      . vitamin E (ALPH-E) 400 UNIT capsule Take 400 Units by mouth 2 (two) times daily.      No current facility-administered medications for this visit.    No Known Allergies  Past Medical History  Diagnosis Date  . Coronary artery disease   . Diabetes mellitus   . Hypertension   . Hyperlipidemia   . CVD (cerebrovascular disease)   . Gout   . Glaucoma   . Paranoid schizophrenia   . Tremor   . History of orthostatic hypotension   . Dyspnea   . Diastolic dysfunction   . Fracture, humerus     right  . Parkinsonism 12/02/2012  . Abnormality of gait 12/02/2012  . Obesity   .  PNA (pneumonia) May 2015  . Dysphagia, pharyngoesophageal phase 06/08/2014    Thickened fluids    Past Surgical History  Procedure Laterality Date  . Cardiac catheterization  08/31/2006    MODERATE INFERIOR WALL HYPOKINESIA WITH EF 45%  . Coronary artery bypass graft  2008    LIMA GRAFT TO THE LAD, SAPHENOUS VEIN GRAFT TO THE DIAGONAL , SEQUENTIAL SAPHENOUS VEIN GRAFT TO THE PDA, POSTERIOR LATERAL , AND OBTUSE MARGINAL VESSELS.  . Tonsillectomy    . Asd repair    . Cholecystectomy      History  Smoking status  . Former Smoker  . Quit date: 03/31/1958  Smokeless tobacco  . Never Used    Comment: quit 1960's    History  Alcohol Use No    Family History  Problem Relation Age of Onset  . Diabetes Father   . Cystic fibrosis Father   . Heart disease Father     Review of  Systems: The review of systems is per the HPI.  All other systems were reviewed and are negative.  Physical Exam: BP 124/68 mmHg  Pulse 76  Ht  (1.702 m)  Wt 93.895 kg (207 lb)  BMI 32.41 kg/m2 Patient is very pleasant and in no acute distress. Less tremor. Skin is warm and dry. Color is normal.  HEENT is unremarkable. Normocephalic/atraumatic. PERRL. Sclera are nonicteric. Neck is supple. No masses. No JVD. Lungs are clear. Cardiac exam shows a regular rate and rhythm. Normal S1-2. soft apical systolic murmur. Abdomen is soft. Extremities are with 1+ edema. Support hose on. Resting tremor noted.  No gross neurologic deficits noted.  Wt Readings from Last 3 Encounters:  10/16/14 93.895 kg (207 lb)  07/18/14 98.5 kg (217 lb 2.5 oz)  06/08/14 94.348 kg (208 lb)     LABORATORY DATA:   Lab Results  Component Value Date   WBC 8.4 07/18/2014   HGB 11.0* 07/18/2014   HCT 34.1* 07/18/2014   PLT 205 07/18/2014   GLUCOSE 174* 07/18/2014   CHOL  08/30/2006    191        ATP III CLASSIFICATION:  <200     mg/dL   Desirable  469-629  mg/dL   Borderline High  >=528    mg/dL   High   TRIG 413* 24/40/1027   HDL 29* 08/30/2006   LDLCALC * 08/30/2006    123        Total Cholesterol/HDL:CHD Risk Coronary Heart Disease Risk Table                     Men   Women  1/2 Average Risk   3.4   3.3   ALT 6 08/04/2013   AST 29 08/04/2013   NA 133* 07/18/2014   K 4.1 07/18/2014   CL 97 07/18/2014   CREATININE 1.40* 07/18/2014   BUN 27* 07/18/2014   CO2 26 07/18/2014   TSH 2.300 08/04/2013   INR 1.8* 09/03/2006   HGBA1C * 03/27/2009    7.9 (NOTE) The ADA recommends the following therapeutic goal for glycemic control related to Hgb A1c measurement: Goal of therapy: <6.5 Hgb A1c  Reference: American Diabetes Association: Clinical Practice Recommendations 2010, Diabetes Care, 2010, 33: (Suppl  1).     Assessment / Plan:  1. Ischemic CM - EF 35-40%. Swelling is improved.  On Coreg and  lasix. Recommend continued sodium restriction. Needs to continue using support hose. If weight increases by 3 lbs or increase edema recommend  he take an extra Lasix. Follow up labs done with Dr. Jacky Kindle.  2. CAD - remote CABG 2008- last Myoview in 2012 with mild apical ischemic and felt to be consistent with his known severe distal LAD disease - no active chest pain. Would favor conservative management.   3. HTN - well controlled.   4. HLD   5. DM   6. Parkinson's  7. Schizophrenia  8. Intermittent LBBB.   I will follow up in 6 months.

## 2014-10-16 NOTE — Patient Instructions (Signed)
Continue your current therapy  I will see you in 6 months.   

## 2014-10-19 NOTE — Telephone Encounter (Signed)
I called the patient. He called back to r/s but was r/s in August. I left him a message stating I have a few appointments available in July that I wanted to offer him. I asked that he call back and speak to me.

## 2014-10-23 ENCOUNTER — Ambulatory Visit: Payer: Self-pay | Admitting: Neurology

## 2014-11-08 ENCOUNTER — Other Ambulatory Visit: Payer: Self-pay | Admitting: Neurology

## 2014-11-21 ENCOUNTER — Encounter: Payer: Self-pay | Admitting: Neurology

## 2014-11-21 ENCOUNTER — Ambulatory Visit (INDEPENDENT_AMBULATORY_CARE_PROVIDER_SITE_OTHER): Payer: Medicare Other | Admitting: Neurology

## 2014-11-21 ENCOUNTER — Other Ambulatory Visit: Payer: Self-pay | Admitting: Cardiology

## 2014-11-21 VITALS — BP 113/64 | HR 77 | Ht 67.0 in

## 2014-11-21 DIAGNOSIS — G20 Parkinson's disease: Secondary | ICD-10-CM

## 2014-11-21 DIAGNOSIS — R269 Unspecified abnormalities of gait and mobility: Secondary | ICD-10-CM | POA: Diagnosis not present

## 2014-11-21 MED ORDER — CARBIDOPA-LEVODOPA-ENTACAPONE 12.5-50-200 MG PO TABS: 1.0000 | ORAL_TABLET | Freq: Four times a day (QID) | ORAL | Status: DC

## 2014-11-21 NOTE — Telephone Encounter (Signed)
REFILL 

## 2014-11-21 NOTE — Progress Notes (Signed)
Reason for visit: Parkinson's disease  AADARSH COZORT is an 81 y.o. male  History of present illness:  Mr. Rainford is a 81 year old left-handed white male with a history of Parkinson's disease. He has been very sensitive to even low doses of Sinemet, he was placed on low-dose Stalevo, and he seems to tolerate the medication fairly well. He has been on 3 tablets daily, and he seems to have done well with the exception of some episodes recently where he will wake up from sleep confused. While awake, he does not have any hallucinations or confusion. The patient has a significant gait disorder, he will walk occasionally with his wife using a walker, he has not had any recent falls. He denies issues with swallowing or choking. He returns to the office today for an evaluation.  Past Medical History  Diagnosis Date  . Coronary artery disease   . Diabetes mellitus   . Hypertension   . Hyperlipidemia   . CVD (cerebrovascular disease)   . Gout   . Glaucoma   . Paranoid schizophrenia   . Tremor   . History of orthostatic hypotension   . Dyspnea   . Diastolic dysfunction   . Fracture, humerus     right  . Parkinsonism 12/02/2012  . Abnormality of gait 12/02/2012  . Obesity   . PNA (pneumonia) May 2015  . Dysphagia, pharyngoesophageal phase 06/08/2014    Thickened fluids    Past Surgical History  Procedure Laterality Date  . Cardiac catheterization  08/31/2006    MODERATE INFERIOR WALL HYPOKINESIA WITH EF 45%  . Coronary artery bypass graft  2008    LIMA GRAFT TO THE LAD, SAPHENOUS VEIN GRAFT TO THE DIAGONAL , SEQUENTIAL SAPHENOUS VEIN GRAFT TO THE PDA, POSTERIOR LATERAL , AND OBTUSE MARGINAL VESSELS.  . Tonsillectomy    . Asd repair    . Cholecystectomy      Family History  Problem Relation Age of Onset  . Diabetes Father   . Cystic fibrosis Father   . Heart disease Father     Social history:  reports that he quit smoking about 56 years ago. He has never used smokeless  tobacco. He reports that he does not drink alcohol or use illicit drugs.   No Known Allergies  Medications:  Prior to Admission medications   Medication Sig Start Date End Date Taking? Authorizing Provider  allopurinol (ZYLOPRIM) 300 MG tablet Take 300 mg by mouth every evening.    Yes Historical Provider, MD  benztropine (COGENTIN) 1 MG tablet Take 1 mg by mouth 3 (three) times daily.   Yes Historical Provider, MD  brimonidine (ALPHAGAN) 0.2 % ophthalmic solution Place 1 drop into both eyes 3 (three) times daily.  07/17/14  Yes Historical Provider, MD  carbidopa-levodopa-entacapone (STALEVO) 12.5-50-200 MG per tablet TAKE 1 TABLET THREE TIMES A DAY 11/09/14  Yes York Spaniel, MD  carvedilol (COREG) 12.5 MG tablet TAKE 1/2 TABLET BY MOUTH TWICE A DAY 11/21/14  Yes Peter M [place], MD  clopidogrel (PLAVIX) 75 MG tablet TAKE ONE TABLET BY MOUTH ONCE DAILY 06/29/14  Yes York Spaniel, MD  dorzolamide-timolol (COSOPT) 22.3-6.8 MG/ML ophthalmic solution Place 1 drop into both eyes 2 (two) times daily.   Yes Historical Provider, MD  furosemide (LASIX) 40 MG tablet Take 40 mg by mouth daily.   Yes Historical Provider, MD  glipiZIDE-metformin (METAGLIP) 5-500 MG per tablet Take 1 tablet by mouth 2 (two) times daily. 10/26/13  Yes Historical Provider, MD  LATANOPROST OP Apply to eye.   Yes Historical Provider, MD  losartan (COZAAR) 100 MG tablet Take 1 tablet (100 mg total) by mouth daily. 06/30/13  Yes Rosalio Macadamia, NP  pantoprazole (PROTONIX) 40 MG tablet Take 40 mg by mouth daily.   Yes Historical Provider, MD  polyethylene glycol (MIRALAX / GLYCOLAX) packet Take 17 g by mouth daily as needed. For constipation   Yes Historical Provider, MD  potassium chloride 20 MEQ/15ML (10%) SOLN Take 15 mLs (20 mEq total) by mouth 3 (three) times daily. 03/17/14  Yes Peter M [place], MD  QUEtiapine (SEROQUEL) 50 MG tablet Take 50 mg by mouth 3 (three) times daily.    Yes Historical Provider, MD  sertraline  (ZOLOFT) 25 MG tablet Take 25 mg by mouth at bedtime.   Yes Historical Provider, MD  simvastatin (ZOCOR) 20 MG tablet Take 1 tablet (20 mg total) by mouth daily at 6 PM. Patient taking differently: Take 20 mg by mouth daily at 2 PM daily at 2 PM.  04/07/14  Yes Peter M [place], MD  vitamin E (ALPH-E) 400 UNIT capsule Take 400 Units by mouth 2 (two) times daily.    Yes Historical Provider, MD    ROS:  Out of a complete 14 system review of symptoms, the patient complains only of the following symptoms, and all other reviewed systems are negative.  Difficulty swallowing, drooling Loss of vision, blurred vision Chest pain Cold intolerance Restless legs Incontinence of bladder Back pain, walking difficulty Tremors Confusion, depression, hallucinations  Blood pressure 113/64, pulse 77, height  (1.702 m).  Physical Exam  General: The patient is alert and cooperative at the time of the examination.  Skin: 2+ edema below the knees is noted bilaterally..   Neurologic Exam  Mental status: The patient is alert and oriented x 3 at the time of the examination. The patient has apparent normal recent and remote memory, with an apparently normal attention span and concentration ability.   Cranial nerves: Facial symmetry is present. Speech is hypophonic. Extraocular movements are full, with exception Summers friction of superior gaze. Visual fields are full.  Motor: The patient has good strength in all 4 extremities.  Sensory examination: Soft touch sensation is symmetric on the face, arms, and legs.  Coordination: The patient has good finger-nose-finger and heel-to-shin bilaterally.  Gait and station: The patient requires assistance with standing. He is stooped, can take short shuffling steps, he has significant difficulty with initiating turns, he will freeze frequently. Tremors are noted with the upper extremities.  Reflexes: Deep tendon reflexes are  symmetric.   Assessment/Plan:  1. Parkinson's disease  2. Gait disorder  The patient will go up on the Stalevo taking 1 tablet 4 times daily. He will continue to remain as active as possible, and try to walk on a regular basis. He will follow-up through this office in 4 months, sooner if needed. The wife is to contact me if the dose increase results in confusion.   Marlan Palau MD 11/21/2014 7:11 PM  [facility] [address] [place], [place] [address]  Phone [phone] Fax [phone]

## 2014-11-21 NOTE — Patient Instructions (Addendum)
   We will increase the stalevo taking 4 tablets a day.   Parkinson Disease Parkinson disease is a disorder of the central nervous system, which includes the brain and spinal cord. A person with this disease slowly loses the ability to completely control body movements. Within the brain, there is a group of nerve cells (basal ganglia) that help control movement. The basal ganglia are damaged and do not work properly in a person with Parkinson disease. In addition, the basal ganglia produce and use a brain chemical called dopamine. The dopamine chemical sends messages to other parts of the body to control and coordinate body movements. Dopamine levels are low in a person with Parkinson disease. If the dopamine levels are low, then the body does not receive the correct messages it needs to move normally.  CAUSES  The exact reason why the basal ganglia get damaged is not known. Some medical researchers have thought that infection, genes, environment, and certain medicines may contribute to the cause.  SYMPTOMS   An early symptom of Parkinson disease is often an uncontrolled shaking (tremor) of the hands. The tremor will often disappear when the affected hand is consciously used.  As the disease progresses, walking, talking, getting out of a chair, and new movements become more difficult.  Muscles get stiff and movements become slower.  Balance and coordination become harder.  Depression, trouble swallowing, urinary problems, constipation, and sleep problems can occur.  Later in the disease, memory and thought processes may deteriorate. DIAGNOSIS  There are no specific tests to diagnose Parkinson disease. You may be referred to a neurologist for evaluation. Your caregiver will ask about your medical history, symptoms, and perform a physical exam. Blood tests and imaging tests of your brain may be performed to rule out other diseases. The imaging tests may include an MRI or a CT scan. TREATMENT   The goal of treatment is to relieve symptoms. Medicines may be prescribed once the symptoms become troublesome. Medicine will not stop the progression of the disease, but medicine can make movement and balance better and help control tremors. Speech and occupational therapy may also be prescribed. Sometimes, surgical treatment of the brain can be done in young people. HOME CARE INSTRUCTIONS  Get regular exercise and rest periods during the day to help prevent exhaustion and depression.  If getting dressed becomes difficult, replace buttons and zippers with Velcro and elastic on your clothing.  Take all medicine as directed by your caregiver.  Install grab bars or railings in your home to prevent falls.  Go to speech or occupational therapy as directed.  Keep all follow-up visits as directed by your caregiver. SEEK MEDICAL CARE IF:  Your symptoms are not controlled with your medicine.  You fall.  You have trouble swallowing or choke on your food. MAKE SURE YOU:  Understand these instructions.  Will watch your condition.  Will get help right away if you are not doing well or get worse. Document Released: 03/14/2000 Document Revised: 07/12/2012 Document Reviewed: 04/16/2011 [facility] Patient Information 2015 [place], [place]. This information is not intended to replace advice given to you by your health care provider. Make sure you discuss any questions you have with your health care provider.

## 2014-12-07 ENCOUNTER — Other Ambulatory Visit: Payer: Self-pay | Admitting: Neurology

## 2015-01-15 ENCOUNTER — Telehealth: Payer: Self-pay | Admitting: Cardiology

## 2015-01-18 NOTE — Telephone Encounter (Signed)
Close encounter 

## 2015-03-29 ENCOUNTER — Other Ambulatory Visit: Payer: Self-pay | Admitting: *Deleted

## 2015-03-29 MED ORDER — POTASSIUM CHLORIDE 20 MEQ/15ML (10%) PO SOLN: 20.0000 meq | Freq: Three times a day (TID) | ORAL | Status: AC

## 2015-04-06 ENCOUNTER — Encounter: Payer: Self-pay | Admitting: Neurology

## 2015-04-06 ENCOUNTER — Ambulatory Visit (INDEPENDENT_AMBULATORY_CARE_PROVIDER_SITE_OTHER): Payer: Medicare Other | Admitting: Neurology

## 2015-04-06 VITALS — BP 106/59 | HR 71 | Ht 67.0 in

## 2015-04-06 DIAGNOSIS — R269 Unspecified abnormalities of gait and mobility: Secondary | ICD-10-CM

## 2015-04-06 DIAGNOSIS — G20 Parkinson's disease: Secondary | ICD-10-CM | POA: Diagnosis not present

## 2015-04-06 MED ORDER — CARBIDOPA-LEVODOPA ER 25-100 MG PO TBCR: 1.0000 | EXTENDED_RELEASE_TABLET | Freq: Three times a day (TID) | ORAL | Status: DC

## 2015-04-06 NOTE — Progress Notes (Signed)
Reason for visit: Parkinson's disease  Terrance Perez is an 82 y.o. male  History of present illness:  Terrance Perez is a 82 year old left-handed white male with a history of Parkinson's disease. The patient has a gait disorder associated with this, he has fallen on occasion. He did have a fall within the last 6-8 weeks, he fell backwards while going up the stairs to his home and knocked over his wife who broke her hip. They do not have a ramp into the house, once in the house, there are no steps. The patient is on Stalevo, but they have found that they can no longer tolerate the cost of the drug. The patient was increased to taking 4 tablets daily, but he became increasingly confused on the drug, they went back down to 3 a day. The patient is minimally mobile, he walks 2 or 3 times a week around the house, otherwise he transfers only from the wheelchair to the bed or to the toilet. The patient denies any issues with swallowing or choking, his wife indicates that he does not have problems with memory. He is leaning to the right even while sitting. He returns to this office for an evaluation.  Past Medical History  Diagnosis Date  . Coronary artery disease   . Diabetes mellitus   . Hypertension   . Hyperlipidemia   . CVD (cerebrovascular disease)   . Gout   . Glaucoma   . Paranoid schizophrenia (HCC)   . Tremor   . History of orthostatic hypotension   . Dyspnea   . Diastolic dysfunction   . Fracture, humerus     right  . Parkinsonism (HCC) 12/02/2012  . Abnormality of gait 12/02/2012  . Obesity   . PNA (pneumonia) May 2015  . Dysphagia, pharyngoesophageal phase 06/08/2014    Thickened fluids    Past Surgical History  Procedure Laterality Date  . Cardiac catheterization  08/31/2006    MODERATE INFERIOR WALL HYPOKINESIA WITH EF 45%  . Coronary artery bypass graft  2008    LIMA GRAFT TO THE LAD, SAPHENOUS VEIN GRAFT TO THE DIAGONAL , SEQUENTIAL SAPHENOUS VEIN GRAFT TO THE PDA,  POSTERIOR LATERAL , AND OBTUSE MARGINAL VESSELS.  . Tonsillectomy    . Asd repair    . Cholecystectomy      Family History  Problem Relation Age of Onset  . Diabetes Father   . Cystic fibrosis Father   . Heart disease Father     Social history:  reports that he quit smoking about 57 years ago. He has never used smokeless tobacco. He reports that he does not drink alcohol or use illicit drugs.   No Known Allergies  Medications:  Prior to Admission medications   Medication Sig Start Date End Date Taking? Authorizing Provider  allopurinol (ZYLOPRIM) 300 MG tablet Take 300 mg by mouth every evening.    Yes Historical Provider, MD  benztropine (COGENTIN) 1 MG tablet Take 1 mg by mouth 2 (two) times daily.    Yes Historical Provider, MD  brimonidine (ALPHAGAN) 0.2 % ophthalmic solution Place 1 drop into both eyes 3 (three) times daily.  07/17/14  Yes Historical Provider, MD  carvedilol (COREG) 12.5 MG tablet TAKE 1/2 TABLET BY MOUTH TWICE A DAY 11/21/14  Yes Peter M [place], MD  clopidogrel (PLAVIX) 75 MG tablet TAKE ONE TABLET BY MOUTH ONCE DAILY 06/29/14  Yes York Spaniel, MD  dorzolamide-timolol (COSOPT) 22.3-6.8 MG/ML ophthalmic solution Place 1 drop into both eyes  2 (two) times daily.   Yes Historical Provider, MD  furosemide (LASIX) 40 MG tablet Take 40 mg by mouth daily.   Yes Historical Provider, MD  glipiZIDE-metformin (METAGLIP) 5-500 MG per tablet Take 1 tablet by mouth 2 (two) times daily. 10/26/13  Yes Historical Provider, MD  LATANOPROST OP Apply to eye.   Yes Historical Provider, MD  losartan (COZAAR) 100 MG tablet Take 1 tablet (100 mg total) by mouth daily. 06/30/13  Yes Rosalio Macadamia, NP  pantoprazole (PROTONIX) 40 MG tablet Take 40 mg by mouth daily.   Yes Historical Provider, MD  polyethylene glycol (MIRALAX / GLYCOLAX) packet Take 17 g by mouth daily as needed. For constipation   Yes Historical Provider, MD  potassium chloride 20 MEQ/15ML (10%) SOLN Take 15 mLs (20 mEq  total) by mouth 3 (three) times daily. 03/29/15  Yes Peter M [place], MD  QUEtiapine (SEROQUEL) 50 MG tablet Take 50 mg by mouth 3 (three) times daily.    Yes Historical Provider, MD  sertraline (ZOLOFT) 25 MG tablet Take 25 mg by mouth at bedtime.   Yes Historical Provider, MD  simvastatin (ZOCOR) 20 MG tablet Take 1 tablet (20 mg total) by mouth daily at 6 PM. Patient taking differently: Take 20 mg by mouth daily at 2 PM daily at 2 PM.  04/07/14  Yes Peter M [place], MD  vitamin E (ALPH-E) 400 UNIT capsule Take 400 Units by mouth 2 (two) times daily.    Yes Historical Provider, MD  Carbidopa-Levodopa ER (SINEMET CR) 25-100 MG tablet controlled release Take 1 tablet by mouth 3 (three) times daily. 04/06/15   York Spaniel, MD    ROS:  Out of a complete 14 system review of symptoms, the patient complains only of the following symptoms, and all other reviewed systems are negative.  Difficulty swallowing, drooling Eye redness, light sensitivity, loss of vision, blurred vision Leg swelling Cold intolerance Constipation Restless legs Difficulty urinating, incontinence of the bladder Back pain, walking difficulty Tremors Confusion, depression, hallucinations  Blood pressure 106/59, pulse 71, height 5\' 7"  (1.702 m).  Physical Exam  General: The patient is alert and cooperative at the time of the examination. The patient is moderately obese. The patient leans to the right with sitting.  Skin: 2+ edema below the knees is noted bilaterally.   Neurologic Exam  Mental status: The patient is alert and oriented x 3 at the time of the examination. The patient has apparent normal recent and remote memory, with an apparently normal attention span and concentration ability.   Cranial nerves: Facial symmetry is present. Speech is normal, no aphasia or dysarthria is noted. Extraocular movements are full. Visual fields are full. Masking of the face is seen.  Motor: The patient has good strength in  all 4 extremities.  Sensory examination: Soft touch sensation is symmetric on the face, arms, and legs.  Coordination: The patient has good finger-nose-finger and heel-to-shin bilaterally.  Gait and station: The patient is able to stand up pushing off of his arms, once up, he can walk with assistance, he has difficulty with turns. He will tend to freeze some when initiating ambulation. Posture is quite stooped.  Reflexes: Deep tendon reflexes are symmetric, but are depressed.   Assessment/Plan:  1. Parkinson's disease  2. Gait disorder  The patient will be taken off of Stalevo, switch to Sinemet taking the 25/100 mg tablets 3 times daily. He is on Cogentin, this dose has been reduced some, this should be eliminated  if possible. The patient will follow-up in 4 months, sooner if needed. I have recommended that they get a ramp into the house to improve safety.  Marlan Palau. Keith Amarie Viles MD 04/06/2015 3:36 PM  [facility] [address] [place], [place] [id number]  Phone [phone] Fax [phone]

## 2015-04-06 NOTE — Patient Instructions (Addendum)
   Sinemet (carbidopa) may result in confusion or hallucinations, drowsiness, nausea, or dizziness. If any significant side effects are noted, please contact our office. Sinemet may not be well absorbed when taken with high protein meals, if tolerated it is best to take 30-45 minutes before you eat.  Parkinson Disease Parkinson disease is a disorder of the central nervous system, which includes the brain and spinal cord. A person with this disease slowly loses the ability to completely control body movements. Within the brain, there is a group of nerve cells (basal ganglia) that help control movement. The basal ganglia are damaged and do not work properly in a person with Parkinson disease. In addition, the basal ganglia produce and use a brain chemical called dopamine. The dopamine chemical sends messages to other parts of the body to control and coordinate body movements. Dopamine levels are low in a person with Parkinson disease. If the dopamine levels are low, then the body does not receive the correct messages it needs to move normally.  CAUSES  The exact reason why the basal ganglia get damaged is not known. Some medical researchers have thought that infection, genes, environment, and certain medicines may contribute to the cause.  SYMPTOMS   An early symptom of Parkinson disease is often an uncontrolled shaking (tremor) of the hands. The tremor will often disappear when the affected hand is consciously used.  As the disease progresses, walking, talking, getting out of a chair, and new movements become more difficult.  Muscles get stiff and movements become slower.  Balance and coordination become harder.  Depression, trouble swallowing, urinary problems, constipation, and sleep problems can occur.  Later in the disease, memory and thought processes may deteriorate. DIAGNOSIS  There are no specific tests to diagnose Parkinson disease. You may be referred to a neurologist for evaluation.  Your caregiver will ask about your medical history, symptoms, and perform a physical exam. Blood tests and imaging tests of your brain may be performed to rule out other diseases. The imaging tests may include an MRI or a CT scan. TREATMENT  The goal of treatment is to relieve symptoms. Medicines may be prescribed once the symptoms become troublesome. Medicine will not stop the progression of the disease, but medicine can make movement and balance better and help control tremors. Speech and occupational therapy may also be prescribed. Sometimes, surgical treatment of the brain can be done in young people. HOME CARE INSTRUCTIONS  Get regular exercise and rest periods during the day to help prevent exhaustion and depression.  If getting dressed becomes difficult, replace buttons and zippers with Velcro and elastic on your clothing.  Take all medicine as directed by your caregiver.  Install grab bars or railings in your home to prevent falls.  Go to speech or occupational therapy as directed.  Keep all follow-up visits as directed by your caregiver. SEEK MEDICAL CARE IF:  Your symptoms are not controlled with your medicine.  You fall.  You have trouble swallowing or choke on your food. MAKE SURE YOU:  Understand these instructions.  Will watch your condition.  Will get help right away if you are not doing well or get worse.   This information is not intended to replace advice given to you by your health care provider. Make sure you discuss any questions you have with your health care provider.   Document Released: 03/14/2000 Document Revised: 07/12/2012 Document Reviewed: 04/16/2011 [employer] Interactive Patient Education 2016 [employer]  

## 2015-04-09 ENCOUNTER — Ambulatory Visit: Payer: Medicare Other | Admitting: Neurology

## 2015-04-23 ENCOUNTER — Other Ambulatory Visit: Payer: Self-pay | Admitting: *Deleted

## 2015-04-23 MED ORDER — SIMVASTATIN 20 MG PO TABS: 20.0000 mg | ORAL_TABLET | Freq: Every day | ORAL | Status: DC

## 2015-04-24 ENCOUNTER — Encounter: Payer: Self-pay | Admitting: Cardiology

## 2015-04-24 ENCOUNTER — Ambulatory Visit (INDEPENDENT_AMBULATORY_CARE_PROVIDER_SITE_OTHER): Payer: Medicare Other | Admitting: Cardiology

## 2015-04-24 VITALS — BP 128/76 | HR 82 | Ht 67.0 in | Wt 201.3 lb

## 2015-04-24 DIAGNOSIS — I5032 Chronic diastolic (congestive) heart failure: Secondary | ICD-10-CM | POA: Diagnosis not present

## 2015-04-24 DIAGNOSIS — I10 Essential (primary) hypertension: Secondary | ICD-10-CM

## 2015-04-24 DIAGNOSIS — I25810 Atherosclerosis of coronary artery bypass graft(s) without angina pectoris: Secondary | ICD-10-CM | POA: Diagnosis not present

## 2015-04-24 MED ORDER — SIMVASTATIN 20 MG PO TABS: 20.0000 mg | ORAL_TABLET | Freq: Every day | ORAL | Status: DC

## 2015-04-24 NOTE — Progress Notes (Signed)
Terrance Perez Date of Birth: December 26, 1933 Medical Record #[id number]  History of Present Illness: Terrance Perez is seen for follow up of CHF.  He has known CAD with past CABG in 2008 by Dr. Laneta Perez. Myoview in October of 2012 showed mild apical ischemia consistent with known severe distal LAD disease - normal EF at 56%. He also has CHF with Echo in March 2015 showing an EF 35-40%. Other issues include DM, HTN, HLD, CVD, gout, paranoid schizophrenia, tremor, Parkinson's.   On follow up today he reports he is doing very well.  Has occasional gas pains relieved with Gas X. Swelling in legs is staying down. He quit wearing support hose for a week and they swelled again. No change in weight. Thinks medicine is making him sleepy. States his tremor is doing better.   Current Outpatient Prescriptions  Medication Sig Dispense Refill  . allopurinol (ZYLOPRIM) 300 MG tablet Take 300 mg by mouth every evening.     . benztropine (COGENTIN) 1 MG tablet Take 1 mg by mouth 2 (two) times daily.     . brimonidine (ALPHAGAN) 0.2 % ophthalmic solution Place 1 drop into both eyes 3 (three) times daily.     . Carbidopa-Levodopa ER (SINEMET CR) 25-100 MG tablet controlled release Take 1 tablet by mouth 3 (three) times daily. 90 tablet 5  . carvedilol (COREG) 12.5 MG tablet TAKE 1/2 TABLET BY MOUTH TWICE A DAY 30 tablet 6  . clopidogrel (PLAVIX) 75 MG tablet TAKE ONE TABLET BY MOUTH ONCE DAILY 90 tablet 4  . dorzolamide-timolol (COSOPT) 22.3-6.8 MG/ML ophthalmic solution Place 1 drop into both eyes 2 (two) times daily.    . furosemide (LASIX) 40 MG tablet Take 40 mg by mouth daily.    [name] glipiZIDE-metformin (METAGLIP) 5-500 MG per tablet Take 1 tablet by mouth 2 (two) times daily.    [name] LATANOPROST OP Apply to eye.    . losartan (COZAAR) 100 MG tablet Take 1 tablet (100 mg total) by mouth daily. 30 tablet 6  . pantoprazole (PROTONIX) 40 MG tablet Take 40 mg by mouth daily.    . polyethylene glycol (MIRALAX /  GLYCOLAX) packet Take 17 g by mouth daily as needed. For constipation    . potassium chloride 20 MEQ/15ML (10%) SOLN Take 15 mLs (20 mEq total) by mouth 3 (three) times daily. 450 mL 6  . QUEtiapine (SEROQUEL) 50 MG tablet Take 50 mg by mouth 3 (three) times daily.     . sertraline (ZOLOFT) 25 MG tablet Take 25 mg by mouth at bedtime.    . simvastatin (ZOCOR) 20 MG tablet Take 1 tablet (20 mg total) by mouth daily at 6 PM. 30 tablet 11  . vitamin E (ALPH-E) 400 UNIT capsule Take 400 Units by mouth 2 (two) times daily.     . carbidopa-levodopa-entacapone (STALEVO) 12.5-50-200 MG tablet Take 1 tablet by mouth 3 (three) times daily. Take 1 tab tid     No current facility-administered medications for this visit.    No Known Allergies  Past Medical History  Diagnosis Date  . Coronary artery disease   . Diabetes mellitus   . Hypertension   . Hyperlipidemia   . CVD (cerebrovascular disease)   . Gout   . Glaucoma   . Paranoid schizophrenia (HCC)   . Tremor   . History of orthostatic hypotension   . Dyspnea   . Diastolic dysfunction   . Fracture, humerus     right  . Parkinsonism (HCC) 12/02/2012  .  Abnormality of gait 12/02/2012  . Obesity   . PNA (pneumonia) May 2015  . Dysphagia, pharyngoesophageal phase 06/08/2014    Thickened fluids    Past Surgical History  Procedure Laterality Date  . Cardiac catheterization  08/31/2006    MODERATE INFERIOR WALL HYPOKINESIA WITH EF 45%  . Coronary artery bypass graft  2008    LIMA GRAFT TO THE LAD, SAPHENOUS VEIN GRAFT TO THE DIAGONAL , SEQUENTIAL SAPHENOUS VEIN GRAFT TO THE PDA, POSTERIOR LATERAL , AND OBTUSE MARGINAL VESSELS.  . Tonsillectomy    . Asd repair    . Cholecystectomy      History  Smoking status  . Former Smoker  . Quit date: 03/31/1958  Smokeless tobacco  . Never Used    Comment: quit 1960's    History  Alcohol Use No    Family History  Problem Relation Age of Onset  . Diabetes Father   . Cystic fibrosis Father     . Heart disease Father     Review of Systems: The review of systems is per the HPI.  All other systems were reviewed and are negative.  Physical Exam: BP 128/76 mmHg  Pulse 82  Ht  (1.702 m)  Wt 91.315 kg (201 lb 5 oz)  BMI 31.52 kg/m2 Patient is very pleasant and in no acute distress. Seen in a wheelchair.  Skin is warm and dry. Color is normal.  HEENT is unremarkable. Normocephalic/atraumatic. PERRL. Sclera are nonicteric. Neck is supple. No masses. No JVD. Lungs are clear. Cardiac exam shows a regular rate and rhythm. Normal S1-2. soft apical systolic murmur. Abdomen is soft. Extremities are with 1+ edema. Support hose on. Mild resting tremor noted.  No gross neurologic deficits noted.  Wt Readings from Last 3 Encounters:  04/24/15 91.315 kg (201 lb 5 oz)  10/16/14 93.895 kg (207 lb)  07/18/14 98.5 kg (217 lb 2.5 oz)     LABORATORY DATA:   Lab Results  Component Value Date   WBC 8.4 07/18/2014   HGB 11.0* 07/18/2014   HCT 34.1* 07/18/2014   PLT 205 07/18/2014   GLUCOSE 174* 07/18/2014   CHOL  08/30/2006    191        ATP III CLASSIFICATION:  <200     mg/dL   Desirable  161-096  mg/dL   Borderline High  >=045    mg/dL   High   TRIG 409* 81/19/1478   HDL 29* 08/30/2006   LDLCALC * 08/30/2006    123        Total Cholesterol/HDL:CHD Risk Coronary Heart Disease Risk Table                     Men   Women  1/2 Average Risk   3.4   3.3   ALT 6 08/04/2013   AST 29 08/04/2013   NA 133* 07/18/2014   K 4.1 07/18/2014   CL 97 07/18/2014   CREATININE 1.40* 07/18/2014   BUN 27* 07/18/2014   CO2 26 07/18/2014   TSH 2.300 08/04/2013   INR 1.8* 09/03/2006   HGBA1C * 03/27/2009    7.9 (NOTE) The ADA recommends the following therapeutic goal for glycemic control related to Hgb A1c measurement: Goal of therapy: <6.5 Hgb A1c  Reference: American Diabetes Association: Clinical Practice Recommendations 2010, Diabetes Care, 2010, 33: (Suppl  1).     Assessment / Plan:   1. Ischemic CM - EF 35-40%. Swelling is stable.  Weight is down 2 lbs.  On Coreg and lasix. Recommend continued sodium restriction. Needs to continue using support hose. If weight increases by 3 lbs or increase edema recommend he take an extra Lasix. Follow up labs done with Dr. Jacky Kindle.  2. CAD - remote CABG 2008- last Myoview in 2012 with mild apical ischemic and felt to be consistent with his known severe distal LAD disease - no active chest pain. Continue  conservative management.   3. HTN - well controlled.   4. HLD   5. DM   6. Parkinson's  7. Schizophrenia  8. Intermittent LBBB.   I will follow up in 6 months.

## 2015-04-24 NOTE — Patient Instructions (Signed)
Continue your current therapy  I will follow up in 6 months   

## 2015-04-26 ENCOUNTER — Other Ambulatory Visit: Payer: Self-pay | Admitting: *Deleted

## 2015-04-26 MED ORDER — SIMVASTATIN 20 MG PO TABS: 20.0000 mg | ORAL_TABLET | Freq: Every day | ORAL | Status: AC

## 2015-06-26 ENCOUNTER — Other Ambulatory Visit: Payer: Self-pay | Admitting: *Deleted

## 2015-06-26 MED ORDER — CARVEDILOL 12.5 MG PO TABS: 6.2500 mg | ORAL_TABLET | Freq: Two times a day (BID) | ORAL | Status: DC

## 2015-06-26 NOTE — Telephone Encounter (Signed)
REFILL 

## 2015-06-26 NOTE — Telephone Encounter (Signed)
Patients wife Corrie DandyMary, left a message on the refill voicemail stating that [facility] contacted the office last Wednesday for a refill on carvedilol and they still have not received a response. She would like a call when this has been sent.

## 2015-07-12 ENCOUNTER — Telehealth: Payer: Self-pay | Admitting: Neurology

## 2015-07-12 NOTE — Telephone Encounter (Signed)
Spoke to patient's wife and patient's appt has been rescheduled.

## 2015-07-12 NOTE — Telephone Encounter (Signed)
Pt's wife called to r/s appt with Dr. Anne HahnWillis due to work conflict. It is going to push the pt well past July to be seen by Dr. Anne HahnWillis. Is it possible for pt to be seen by NP? If so the wife would like an appt for a late afternoon appt. Please call and advise [phone]

## 2015-08-15 ENCOUNTER — Ambulatory Visit (INDEPENDENT_AMBULATORY_CARE_PROVIDER_SITE_OTHER): Payer: Medicare Other | Admitting: Neurology

## 2015-08-15 ENCOUNTER — Encounter: Payer: Self-pay | Admitting: Neurology

## 2015-08-15 VITALS — BP 108/60 | HR 76 | Ht 67.0 in | Wt 185.0 lb

## 2015-08-15 DIAGNOSIS — R269 Unspecified abnormalities of gait and mobility: Secondary | ICD-10-CM

## 2015-08-15 DIAGNOSIS — G20 Parkinson's disease: Secondary | ICD-10-CM | POA: Diagnosis not present

## 2015-08-15 MED ORDER — QUETIAPINE FUMARATE 50 MG PO TABS: ORAL_TABLET | ORAL | Status: AC

## 2015-08-15 MED ORDER — BENZTROPINE MESYLATE 1 MG PO TABS: 0.5000 mg | ORAL_TABLET | Freq: Two times a day (BID) | ORAL | Status: DC

## 2015-08-15 NOTE — Progress Notes (Signed)
Reason for visit: Parkinson's disease  Terrance Perez is an 82 y.o. male  History of present illness:  Terrance Perez is a 82 year old left-handed white male with a history of Parkinson's disease with significant issues with gait. The patient has been relatively inactive at home, he is quite drowsy from his medications, and he sleeps throughout most of the day. He oftentimes does not eat properly even though he has a history of diabetes. He will sleep in the daytime and not eat. He is doing very little activity, he may walk on occasion with a walker. The patient has not had any falls since last seen. He has some memory issues, and he does have some episodes of hallucinations that may occur once or twice a week. He is followed through psychiatry, and he currently is on Seroquel 50 mg 3 times daily and on Cogentin 1 mg twice daily. The patient returns to this office for an evaluation. He has been taken off of Stalevo previously secondary to the cost of the medication. He is on regular Sinemet taking the 25/100 mg tablet 3 times daily.  Past Medical History  Diagnosis Date  . Coronary artery disease   . Diabetes mellitus   . Hypertension   . Hyperlipidemia   . CVD (cerebrovascular disease)   . Gout   . Glaucoma   . Paranoid schizophrenia (HCC)   . Tremor   . History of orthostatic hypotension   . Dyspnea   . Diastolic dysfunction   . Fracture, humerus     right  . Parkinsonism (HCC) 12/02/2012  . Abnormality of gait 12/02/2012  . Obesity   . PNA (pneumonia) May 2015  . Dysphagia, pharyngoesophageal phase 06/08/2014    Thickened fluids    Past Surgical History  Procedure Laterality Date  . Cardiac catheterization  08/31/2006    MODERATE INFERIOR WALL HYPOKINESIA WITH EF 45%  . Coronary artery bypass graft  2008    LIMA GRAFT TO THE LAD, SAPHENOUS VEIN GRAFT TO THE DIAGONAL , SEQUENTIAL SAPHENOUS VEIN GRAFT TO THE PDA, POSTERIOR LATERAL , AND OBTUSE MARGINAL VESSELS.  . Tonsillectomy     . Asd repair    . Cholecystectomy      Family History  Problem Relation Age of Onset  . Diabetes Father   . Cystic fibrosis Father   . Heart disease Father     Social history:  reports that he quit smoking about 57 years ago. He has never used smokeless tobacco. He reports that he does not drink alcohol or use illicit drugs.   No Known Allergies  Medications:  Prior to Admission medications   Medication Sig Start Date End Date Taking? Authorizing Provider  allopurinol (ZYLOPRIM) 300 MG tablet Take 300 mg by mouth every evening.    Yes Historical Provider, MD  benztropine (COGENTIN) 1 MG tablet Take 1 mg by mouth 2 (two) times daily.    Yes Historical Provider, MD  brimonidine (ALPHAGAN) 0.2 % ophthalmic solution Place 1 drop into both eyes 3 (three) times daily.  07/17/14  Yes Historical Provider, MD  Carbidopa-Levodopa ER (SINEMET CR) 25-100 MG tablet controlled release Take 1 tablet by mouth 3 (three) times daily. 04/06/15  Yes York Spaniel, MD  carvedilol (COREG) 12.5 MG tablet Take 0.5 tablets (6.25 mg total) by mouth 2 (two) times daily. 06/26/15  Yes Peter M Swaziland, MD  clopidogrel (PLAVIX) 75 MG tablet TAKE ONE TABLET BY MOUTH ONCE DAILY 06/29/14  Yes York Spaniel, MD  dorzolamide-timolol (COSOPT) 22.3-6.8 MG/ML ophthalmic solution Place 1 drop into both eyes 2 (two) times daily.   Yes Historical Provider, MD  furosemide (LASIX) 40 MG tablet Take 40 mg by mouth daily.   Yes Historical Provider, MD  glipiZIDE-metformin (METAGLIP) 5-500 MG per tablet Take 1 tablet by mouth 2 (two) times daily. 10/26/13  Yes Historical Provider, MD  LATANOPROST OP Apply to eye.   Yes Historical Provider, MD  losartan (COZAAR) 100 MG tablet Take 1 tablet (100 mg total) by mouth daily. 06/30/13  Yes Rosalio Macadamia, NP  pantoprazole (PROTONIX) 40 MG tablet Take 40 mg by mouth daily.   Yes Historical Provider, MD  polyethylene glycol (MIRALAX / GLYCOLAX) packet Take 17 g by mouth daily as needed.  For constipation   Yes Historical Provider, MD  potassium chloride 20 MEQ/15ML (10%) SOLN Take 15 mLs (20 mEq total) by mouth 3 (three) times daily. 03/29/15  Yes Peter M Swaziland, MD  QUEtiapine (SEROQUEL) 50 MG tablet Take 50 mg by mouth 3 (three) times daily.    Yes Historical Provider, MD  sertraline (ZOLOFT) 25 MG tablet Take 25 mg by mouth at bedtime.   Yes Historical Provider, MD  simvastatin (ZOCOR) 20 MG tablet Take 1 tablet (20 mg total) by mouth daily at 6 PM. 04/26/15  Yes Peter M Swaziland, MD  vitamin E (ALPH-E) 400 UNIT capsule Take 400 Units by mouth 2 (two) times daily.    Yes Historical Provider, MD    ROS:  Out of a complete 14 system review of symptoms, the patient complains only of the following symptoms, and all other reviewed systems are negative.  Difficulty swallowing, drooling Eye redness, light sensitivity, loss of vision, blurred vision Leg swelling Cold intolerance, constipation Restless legs Difficulty urinating, incontinence of the bladder Back pain, walking difficulty, neck pain Tremors Confusion, hallucinations  Blood pressure 108/60, pulse 76, height 5\' 7"  (1.702 m), weight 185 lb (83.915 kg).   Blood pressure, sitting, right arm is 94/50. Blood pressure, standing, right arm is 100/54.  Physical Exam  General: The patient is alert and cooperative at the time of the examination.  Neuromuscular: The patient has a tendency to lean to the right, he keeps his neck and head in flexion.  Skin: No significant peripheral edema is noted.   Neurologic Exam  Mental status: The patient is alert and oriented x 3 at the time of the examination. The patient is drowsy, but he will alert and cooperate with examination.   Cranial nerves: Facial symmetry is present. Speech is dysphonic, low in amplitude, not aphasic. Extraocular movements are full. Visual fields are full.  Motor: The patient has good strength in all 4 extremities.  Sensory examination: Soft touch  sensation is symmetric on the face, arms, and legs.  Coordination: The patient has good finger-nose-finger and heel-to-shin bilaterally.  Gait and station: The patient is in a wheelchair, he requires assistance with standing. Once up, he cannot effectively walk. He has difficulty standing independently.  Reflexes: Deep tendon reflexes are symmetric.   Assessment/Plan:  1. Parkinson's disease  2. Gait disorder  3. Excessive daytime drowsiness  4. Dropped head syndrome  The patient is functionally not doing well at home, he is very inactive, walking only short distances. The patient spends most of his days sleeping. He is not eating well because of this. We will try cutting back on the Seroquel during the daytime taking 25 mg twice during the day and 50 milligrams at night. The patient  will be cut back on Cogentin taking 0.5 mg twice daily. He will remain on the current dose of Sinemet, follow-up in 3 months. I have encouraged him to remain as active as possible.  Marlan Palau. Keith Kreig Parson MD 08/15/2015 7:55 PM  [facility] [address] [place], [place] [id number]  Phone [phone] Fax [phone]

## 2015-08-17 ENCOUNTER — Ambulatory Visit: Payer: Medicare Other | Admitting: Neurology

## 2015-09-26 ENCOUNTER — Other Ambulatory Visit: Payer: Self-pay | Admitting: Neurology

## 2015-10-04 ENCOUNTER — Other Ambulatory Visit: Payer: Self-pay | Admitting: Neurology

## 2015-10-26 ENCOUNTER — Ambulatory Visit (INDEPENDENT_AMBULATORY_CARE_PROVIDER_SITE_OTHER): Payer: Medicare Other | Admitting: Cardiology

## 2015-10-26 ENCOUNTER — Encounter: Payer: Self-pay | Admitting: Cardiology

## 2015-10-26 VITALS — BP 97/53 | HR 71 | Ht 67.0 in | Wt 183.0 lb

## 2015-10-26 DIAGNOSIS — I255 Ischemic cardiomyopathy: Secondary | ICD-10-CM | POA: Diagnosis not present

## 2015-10-26 DIAGNOSIS — I10 Essential (primary) hypertension: Secondary | ICD-10-CM | POA: Diagnosis not present

## 2015-10-26 DIAGNOSIS — Z951 Presence of aortocoronary bypass graft: Secondary | ICD-10-CM

## 2015-10-26 DIAGNOSIS — N183 Chronic kidney disease, stage 3 unspecified: Secondary | ICD-10-CM | POA: Insufficient documentation

## 2015-10-26 DIAGNOSIS — G20 Parkinson's disease: Secondary | ICD-10-CM

## 2015-10-26 DIAGNOSIS — N189 Chronic kidney disease, unspecified: Secondary | ICD-10-CM

## 2015-10-26 MED ORDER — LOSARTAN POTASSIUM 100 MG PO TABS: 50.0000 mg | ORAL_TABLET | Freq: Every day | ORAL | 6 refills | Status: AC

## 2015-10-26 NOTE — Patient Instructions (Signed)
Your physician has recommended you make the following change in your medication:   1.) the losartan has been decreased from100 mg down to 50 mg daily.  ( 1/2 tablet of the 100 mg)   Your physician wants you to follow-up in: 6 months or sooner with Dr Swaziland. You will receive a reminder letter in the mail two months in advance. If you don't receive a letter, please call our office to schedule the follow-up appointment.

## 2015-10-26 NOTE — Assessment & Plan Note (Signed)
B/P has been running low- decrease Cozaar

## 2015-10-26 NOTE — Assessment & Plan Note (Signed)
Status post CABG in 2008 including an LIMA graft to the LAD, saphenous vein graft to the diagonal, sequential saphenous vein graft to the PDA, posterior lateral, and obtuse marginal vessels. Nuclear stress test in May of 2011 was normal.

## 2015-10-26 NOTE — Progress Notes (Signed)
10/26/2015 KOLTYN KELSAY   06-27-1933  [id number]  Primary Physician ARONSON,RICHARD A, MD Primary Cardiologist: Dr Swaziland  HPI:  82 y/o male followed by Dr Swaziland with a history of known CAD, s/p CABG in 2008 by Dr. Laneta Simmers. Myoview in October of 2012 showed mild apical ischemia consistent with known severe distal LAD disease - normal EF at 56%. He also has CHF with Echo in March 2015 showing an EF 35-40%. Other issues include DM, HTN, HLD, CVD, gout, paranoid schizophrenia, tremor, Parkinson's. He is significantly debilitated and in a wheel chair. His wife works during the day and the pt's grand daughter takes care of him at home. He denies any chest pain or unusual dyspnea. His B/P has been running a little low- 90's systolic.    Current Outpatient Prescriptions  Medication Sig Dispense Refill  . allopurinol (ZYLOPRIM) 300 MG tablet Take 300 mg by mouth every evening.     . benztropine (COGENTIN) 1 MG tablet Take 0.5 tablets (0.5 mg total) by mouth 2 (two) times daily. (Patient taking differently: Take 0.5 mg by mouth 3 (three) times daily. )    . brimonidine (ALPHAGAN) 0.2 % ophthalmic solution Place 1 drop into both eyes 3 (three) times daily.     . Carbidopa-Levodopa ER (SINEMET CR) 25-100 MG tablet controlled release TAKE ONE TABLET BY MOUTH THREE TIMES DAILY 90 tablet 5  . carvedilol (COREG) 12.5 MG tablet Take 0.5 tablets (6.25 mg total) by mouth 2 (two) times daily. 30 tablet 3  . clopidogrel (PLAVIX) 75 MG tablet TAKE ONE TABLET BY MOUTH ONCE DAILY 90 tablet 3  . dorzolamide-timolol (COSOPT) 22.3-6.8 MG/ML ophthalmic solution Place 1 drop into both eyes 2 (two) times daily.    . furosemide (LASIX) 40 MG tablet Take 40 mg by mouth daily.    [name] glipiZIDE-metformin (METAGLIP) 5-500 MG per tablet Take 1 tablet by mouth daily.     [name] LATANOPROST OP Apply to eye.    . losartan (COZAAR) 100 MG tablet Take 0.5 tablets (50 mg total) by mouth daily. 30 tablet 6  . pantoprazole  (PROTONIX) 40 MG tablet Take 40 mg by mouth daily.    . polyethylene glycol (MIRALAX / GLYCOLAX) packet Take 17 g by mouth daily as needed. For constipation    . potassium chloride 20 MEQ/15ML (10%) SOLN Take 15 mLs (20 mEq total) by mouth 3 (three) times daily. 450 mL 6  . QUEtiapine (SEROQUEL) 50 MG tablet 1/2 tablet in the morning and midday, one tablet in the evening    . sertraline (ZOLOFT) 25 MG tablet Take 25 mg by mouth at bedtime.    . simvastatin (ZOCOR) 20 MG tablet Take 1 tablet (20 mg total) by mouth daily at 6 PM. 30 tablet 7  . vitamin E (ALPH-E) 400 UNIT capsule Take 400 Units by mouth 2 (two) times daily.      No current facility-administered medications for this visit.     No Known Allergies  Social History   Social History  . Marital status: Married    Spouse name: [name]  . Number of children: 2  . Years of education: GED   Occupational History  . retired Retired    retired   Social History Main Topics  . Smoking status: Former Smoker    Quit date: 03/31/1958  . Smokeless tobacco: Never Used     Comment: quit 1960's  . Alcohol use No  . Drug use: No  . Sexual activity: No  Other Topics Concern  . Not on file   Social History Narrative   Patient is left handed.   Patient occasionally drinks 1 cup of caffeine.     Review of Systems: General: negative for chills, fever, night sweats or weight changes.  Cardiovascular: negative for chest pain, dyspnea on exertion, edema, orthopnea, palpitations, paroxysmal nocturnal dyspnea or shortness of breath Dermatological: negative for rash Respiratory: negative for cough or wheezing Urologic: negative for hematuria Abdominal: negative for nausea, vomiting, diarrhea, bright red blood per rectum, melena, or hematemesis Neurologic: negative for visual changes, syncope, or dizziness All other systems reviewed and are otherwise negative except as noted above.    Blood pressure (!) 97/53, pulse 71, height 5\' 7"   (1.702 m), weight 183 lb (83 kg).  General appearance: alert, cooperative, no distress and in wheel chair Lungs: clear to auscultation bilaterally and kyphosis Heart: regular rate and rhythm Extremities: no edema Neurologic: Grossly normal  EKG NSR, RBBB  ASSESSMENT AND PLAN:   Hx of CABG Status post CABG in 2008 including an LIMA graft to the LAD, saphenous vein graft to the diagonal, sequential saphenous vein graft to the PDA, posterior lateral, and obtuse marginal vessels. Nuclear stress test in May of 2011 was normal.  Parkinsonism Wheelchair bound  Hypertension B/P has been running low- decrease Cozaar  Cardiomyopathy, ischemic EF 30-35% by echo in 2015,  No CHF on exam or by history  Chronic renal insufficiency, stage III (moderate) Recent SCr 1.4, GFR 48   PLAN  I decreased Mr Boschee's Cozaar to 50 mg daily. F/U Dr Swaziland in 6 months. Recent labs done by Dr Jacky Kindle and I reviewed.   Corine Shelter PA-C 10/26/2015 4:30 PM

## 2015-10-26 NOTE — Assessment & Plan Note (Addendum)
EF 30-35% by echo in 2015,  No CHF on exam or by history

## 2015-10-26 NOTE — Assessment & Plan Note (Signed)
Wheelchair bound 

## 2015-10-26 NOTE — Assessment & Plan Note (Signed)
Recent SCr 1.4, GFR 48

## 2015-11-01 NOTE — Addendum Note (Signed)
Addended by: Freddi Starr on: 11/01/2015 11:32 AM   Modules accepted: Orders

## 2015-11-23 ENCOUNTER — Ambulatory Visit: Payer: Medicare Other | Admitting: Neurology

## 2015-11-26 IMAGING — CR DG CHEST 1V PORT
1 series · 2 of 2 positions shown · non-contrast
Comparison: 08/08/2013

CLINICAL DATA: Central chest pain beginning this morning. Dull
pain. Previous CABG.

EXAM:
PORTABLE CHEST - 1 VIEW

[Series 1: AP · U · 2 of 2 slices shown]
[im 1/2]
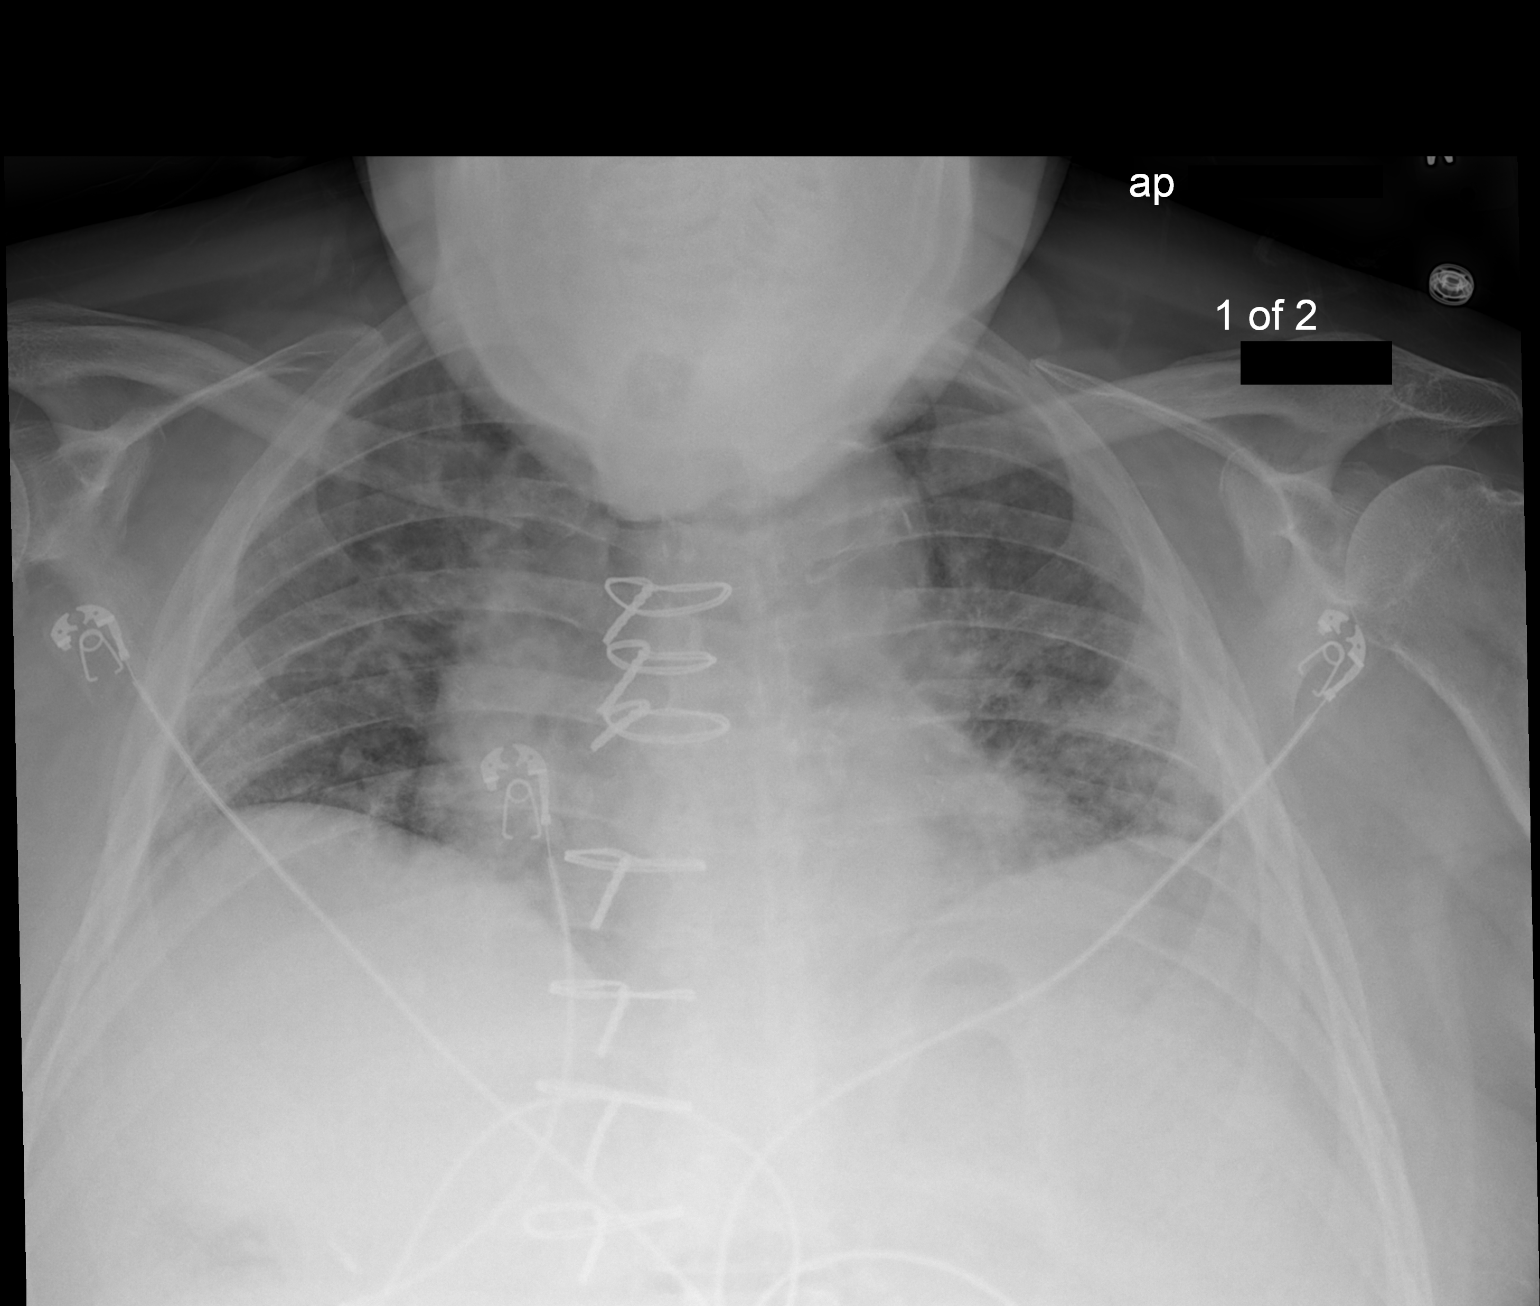
[im 2/2]
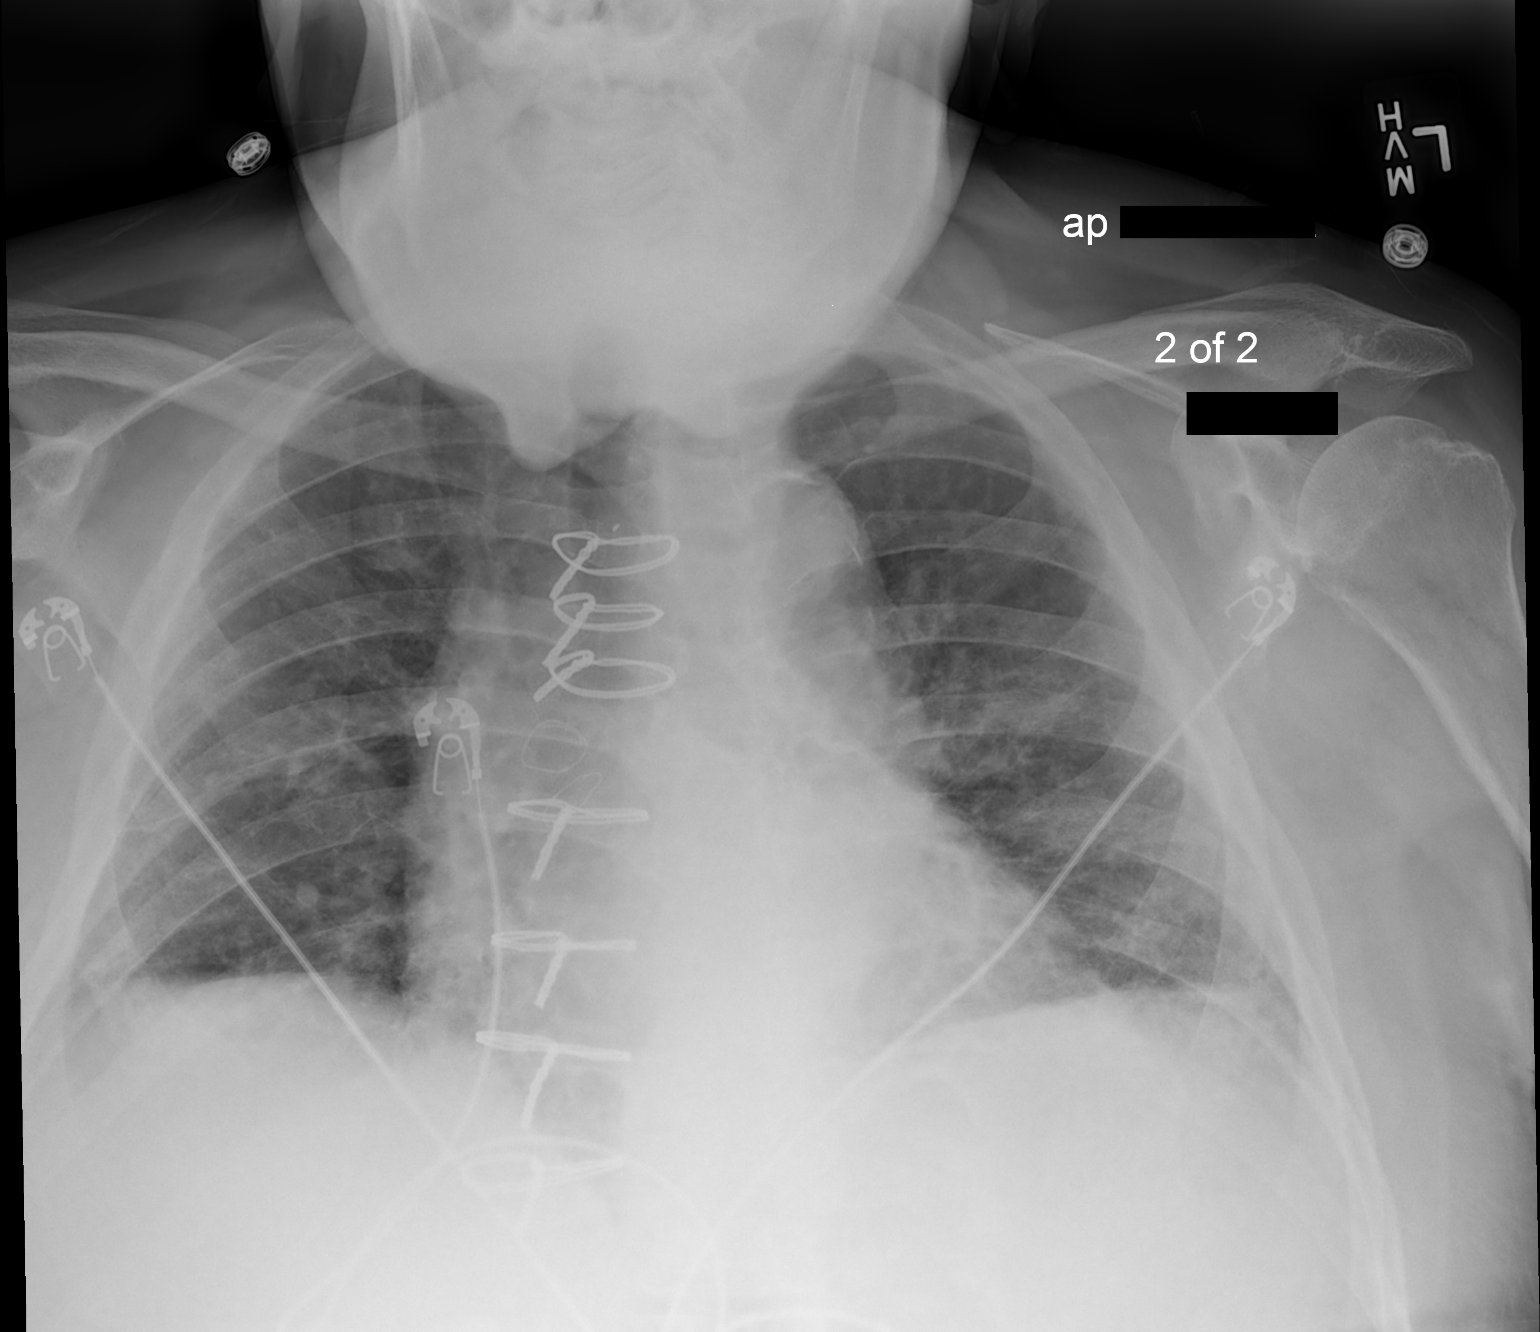

[2 of 2 positions shown; findings below may reference images not displayed]

FINDINGS: Artifact overlies the chest. There has been previous median
sternotomy and CABG. The heart is enlarged. There is calcification
and unfolding of the thoracic aorta. There is pulmonary venous
hypertension without frank edema. No infiltrate, collapse or visible
effusion. No acute bony finding.
IMPRESSION: Previous CABG. Cardiomegaly. Aortic atherosclerosis. Pulmonary
venous hypertension.

## 2016-01-02 ENCOUNTER — Ambulatory Visit: Payer: Medicare Other | Admitting: Neurology

## 2016-01-07 ENCOUNTER — Other Ambulatory Visit: Payer: Self-pay

## 2016-01-07 MED ORDER — CARBIDOPA-LEVODOPA ER 25-100 MG PO TBCR: 1.0000 | EXTENDED_RELEASE_TABLET | Freq: Three times a day (TID) | ORAL | 0 refills | Status: DC

## 2016-01-11 ENCOUNTER — Ambulatory Visit: Payer: Medicare Other | Admitting: Neurology

## 2016-01-31 ENCOUNTER — Other Ambulatory Visit: Payer: Self-pay | Admitting: Neurology

## 2016-02-04 ENCOUNTER — Ambulatory Visit (INDEPENDENT_AMBULATORY_CARE_PROVIDER_SITE_OTHER): Payer: Medicare Other | Admitting: Neurology

## 2016-02-04 ENCOUNTER — Encounter: Payer: Self-pay | Admitting: Neurology

## 2016-02-04 VITALS — BP 113/68 | HR 68 | Ht 67.0 in | Wt 183.0 lb

## 2016-02-04 DIAGNOSIS — G2581 Restless legs syndrome: Secondary | ICD-10-CM

## 2016-02-04 DIAGNOSIS — G20 Parkinson's disease: Secondary | ICD-10-CM

## 2016-02-04 DIAGNOSIS — R269 Unspecified abnormalities of gait and mobility: Secondary | ICD-10-CM

## 2016-02-04 HISTORY — DX: Restless legs syndrome: G25.81

## 2016-02-04 MED ORDER — CARBIDOPA-LEVODOPA ER 25-100 MG PO TBCR: EXTENDED_RELEASE_TABLET | ORAL | 2 refills | Status: AC

## 2016-02-04 NOTE — Patient Instructions (Addendum)
With the Sinemet (carbidopa) 25/100 mg tablet, take 1.5 tablets on the first and third doses, and one tablet on the second dose.   With the cogentin (benztropine) 0.5 mg tablet reduce to one tablet twice a day.  Fall Prevention in the Home  Falls can cause injuries and can affect people from all age groups. There are many simple things that you can do to make your home safe and to help prevent falls. WHAT CAN I DO ON THE OUTSIDE OF MY HOME?  Regularly repair the edges of walkways and driveways and fix any cracks.  Remove high doorway thresholds.  Trim any shrubbery on the main path into your home.  Use bright outdoor lighting.  Clear walkways of debris and clutter, including tools and rocks.  Regularly check that handrails are securely fastened and in good repair. Both sides of any steps should have handrails.  Install guardrails along the edges of any raised decks or porches.  Have leaves, snow, and ice cleared regularly.  Use sand or salt on walkways during winter months.  In the garage, clean up any spills right away, including grease or oil spills. WHAT CAN I DO IN THE BATHROOM?  Use night lights.  Install grab bars by the toilet and in the tub and shower. Do not use towel bars as grab bars.  Use non-skid mats or decals on the floor of the tub or shower.  If you need to sit down while you are in the shower, use a plastic, non-slip stool.[name].  Keep the floor dry. Immediately clean up any water that spills on the floor.  Remove soap buildup in the tub or shower on a regular basis.  Attach bath mats securely with double-sided non-slip rug tape.  Remove throw rugs and other tripping hazards from the floor. WHAT CAN I DO IN THE BEDROOM?  Use night lights.  Make sure that a bedside light is easy to reach.  Do not use oversized bedding that drapes onto the floor.  Have a firm chair that has side arms to use for getting dressed.  Remove throw rugs and other tripping  hazards from the floor. WHAT CAN I DO IN THE KITCHEN?   Clean up any spills right away.  Avoid walking on wet floors.  Place frequently used items in easy-to-reach places.  If you need to reach for something above you, use a sturdy step stool that has a grab bar.  Keep electrical cables out of the way.  Do not use floor polish or wax that makes floors slippery. If you have to use wax, make sure that it is non-skid floor wax.  Remove throw rugs and other tripping hazards from the floor. WHAT CAN I DO IN THE STAIRWAYS?  Do not leave any items on the stairs.  Make sure that there are handrails on both sides of the stairs. Fix handrails that are broken or loose. Make sure that handrails are as long as the stairways.  Check any carpeting to make sure that it is firmly attached to the stairs. Fix any carpet that is loose or worn.  Avoid having throw rugs at the top or bottom of stairways, or secure the rugs with carpet tape to prevent them from moving.  Make sure that you have a light switch at the top of the stairs and the bottom of the stairs. If you do not have them, have them installed. WHAT ARE SOME OTHER FALL PREVENTION TIPS?  Wear closed-toe shoes that fit  well and support your feet. Wear shoes that have rubber soles or low heels.  When you use a stepladder, make sure that it is completely opened and that the sides are firmly locked. Have someone hold the ladder while you are using it. Do not climb a closed stepladder.  Add color or contrast paint or tape to grab bars and handrails in your home. Place contrasting color strips on the first and last steps.  Use mobility aids as needed, such as canes, walkers, scooters, and crutches.  Turn on lights if it is dark. Replace any light bulbs that burn out.  Set up furniture so that there are clear paths. Keep the furniture in the same spot.  Fix any uneven floor surfaces.  Choose a carpet design that does not hide the edge of  steps of a stairway.  Be aware of any and all pets.  Review your medicines with your healthcare provider. Some medicines can cause dizziness or changes in blood pressure, which increase your risk of falling. Talk with your health care provider about other ways that you can decrease your risk of falls. This may include working with a physical therapist or trainer to improve your strength, balance, and endurance.   This information is not intended to replace advice given to you by your health care provider. Make sure you discuss any questions you have with your health care provider.   Document Released: 03/07/2002 Document Revised: 08/01/2014 Document Reviewed: 04/21/2014 Elsevier Interactive Patient Education [employer]2016 Elsevier Inc.

## 2016-02-04 NOTE — Progress Notes (Signed)
Reason for visit: Parkinson's disease  Terrance Perez is an 82 y.o. male  History of present illness:  Terrance. Perez is a 82 year old left-handed white male with a history of Parkinson's disease. The patient has been extremely inactive at home. He spends most of his day in a lift chair, he gets up occasionally to go to a potty chair right next to his lift chair, and then gets back in the lift chair where he sleeps at night. He has a walker for ambulation, he rarely uses it. His primary care physician tried to set him up for physical therapy, but he did not wish to pursue this. The patient takes Sinemet CR 25/100 mg tablets 3 times daily. The patient is on Cogentin taking 0.5 mg 3 times daily. He is on Seroquel, the dose was reduced when last seen, but he apparently had increased problems with anxiety and went back to taking the medication as was prescribed previously, taking 25 mg twice during the day and 50 mg at night. The patient denies any significant memory problems. He still has occasional hallucinations. He has a tendency to lean to the right. He has not had any falls since last seen. He has "dropped head syndrome".  Past Medical History:  Diagnosis Date  . Abnormality of gait 12/02/2012  . Coronary artery disease   . CVD (cerebrovascular disease)   . Diabetes mellitus   . Diastolic dysfunction   . Dysphagia, pharyngoesophageal phase 06/08/2014   Thickened fluids  . Dyspnea   . Fracture, humerus    right  . Glaucoma   . Gout   . History of orthostatic hypotension   . Hyperlipidemia   . Hypertension   . Obesity   . Paranoid schizophrenia (HCC)   . Parkinsonism (HCC) 12/02/2012  . PNA (pneumonia) May 2015  . Tremor     Past Surgical History:  Procedure Laterality Date  . ASD REPAIR    . CARDIAC CATHETERIZATION  08/31/2006   MODERATE INFERIOR WALL HYPOKINESIA WITH EF 45%  . CHOLECYSTECTOMY    . CORONARY ARTERY BYPASS GRAFT  2008   LIMA GRAFT TO THE LAD, SAPHENOUS VEIN GRAFT  TO THE DIAGONAL , SEQUENTIAL SAPHENOUS VEIN GRAFT TO THE PDA, POSTERIOR LATERAL , AND OBTUSE MARGINAL VESSELS.  . TONSILLECTOMY      Family History  Problem Relation Age of Onset  . Diabetes Father   . Cystic fibrosis Father   . Heart disease Father     Social history:  reports that he quit smoking about 57 years ago. He has never used smokeless tobacco. He reports that he does not drink alcohol or use drugs.   No Known Allergies  Medications:  Prior to Admission medications   Medication Sig Start Date End Date Taking? Authorizing Provider  allopurinol (ZYLOPRIM) 300 MG tablet Take 300 mg by mouth every evening.    Yes Historical Provider, MD  benztropine (COGENTIN) 0.5 MG tablet Take 1 tablet by mouth 3 (three) times daily. 01/04/16  Yes Historical Provider, MD  brimonidine (ALPHAGAN) 0.2 % ophthalmic solution Place 1 drop into both eyes 3 (three) times daily.  07/17/14  Yes Historical Provider, MD  Carbidopa-Levodopa ER (SINEMET CR) 25-100 MG tablet controlled release Take 1 tablet by mouth 3 (three) times daily. 01/07/16  Yes York Spaniel, MD  carvedilol (COREG) 12.5 MG tablet Take 0.5 tablets (6.25 mg total) by mouth 2 (two) times daily. 06/26/15  Yes Peter M Swaz[place], MD  clopidogrel (PLAVIX) 75 MG tablet  TAKE ONE TABLET BY MOUTH ONCE DAILY 09/27/15  Yes York Spanielharles K Oran Dillenburg, MD  dorzolamide-timolol (COSOPT) 22.3-6.8 MG/ML ophthalmic solution Place 1 drop into both eyes 2 (two) times daily.   Yes Historical Provider, MD  furosemide (LASIX) 40 MG tablet Take 40 mg by mouth daily.   Yes Historical Provider, MD  glipiZIDE-metformin (METAGLIP) 5-500 MG per tablet Take 1 tablet by mouth daily.  10/26/13  Yes Historical Provider, MD  LATANOPROST OP Apply to eye.   Yes Historical Provider, MD  losartan (COZAAR) 100 MG tablet Take 0.5 tablets (50 mg total) by mouth daily. 10/26/15  Yes Luke K Kilroy, PA-C  pantoprazole (PROTONIX) 40 MG tablet Take 40 mg by mouth daily.   Yes Historical Provider, MD    polyethylene glycol (MIRALAX / GLYCOLAX) packet Take 17 g by mouth daily as needed. For constipation   Yes Historical Provider, MD  potassium chloride 20 MEQ/15ML (10%) SOLN Take 15 mLs (20 mEq total) by mouth 3 (three) times daily. 03/29/15  Yes Peter M SwazilandJordan, MD  QUEtiapine (SEROQUEL) 50 MG tablet 1/2 tablet in the morning and midday, one tablet in the evening Patient taking differently: Take 50 mg by mouth 3 (three) times daily. 1/2 tablet in the morning and midday, one tablet in the evening 08/15/15  Yes York Spanielharles K  Niese, MD  sertraline (ZOLOFT) 25 MG tablet Take 25 mg by mouth at bedtime.   Yes Historical Provider, MD  simvastatin (ZOCOR) 20 MG tablet Take 1 tablet (20 mg total) by mouth daily at 6 PM. 04/26/15  Yes Peter M SwazilandJordan, MD  vitamin E (ALPH-E) 400 UNIT capsule Take 400 Units by mouth 2 (two) times daily.    Yes Historical Provider, MD    ROS:  Out of a complete 14 system review of symptoms, the patient complains only of the following symptoms, and all other reviewed systems are negative.  Difficulty swallowing, drooling Blurred vision Cough, shortness of breath Leg swelling Cold intolerance Constipation Restless legs, daytime sleepiness Incontinence of the bladder Joint swelling, back pain, walking difficulty Weakness Confusion, depression, hallucinations, anxiety  Blood pressure 113/68, pulse 68, height 5\' 7"  (1.702 m), weight 183 lb (83 kg).  Physical Exam  General: The patient is alert and cooperative at the time of the examination. The patient is moderately obese.  Skin: 2+ edema at the ankles is noted bilaterally.   Neurologic Exam  Mental status: The patient is alert and oriented x 3 at the time of the examination. The patient has apparent normal recent and remote memory, with an apparently normal attention span and concentration ability.   Cranial nerves: Facial symmetry is present. Speech is normal, no aphasia or dysarthria is noted. Extraocular  movements are full. Visual fields are full. Masking of the face is seen. The head is in flexion.  Motor: The patient has good strength in all 4 extremities.  Sensory examination: Soft touch sensation is symmetric on the face, arms, and legs.  Coordination: The patient has good finger-nose-finger and heel-to-shin bilaterally.  Gait and station: The patient requires assistance with standing. He has a tendency to lean to the right. He can take a few steps with assistance, he has a tendency to fall if he is not supportive. Tandem gait could not be attempted. Romberg is positive.  Reflexes: Deep tendon reflexes are symmetric.   Assessment/Plan:  1. Parkinson's disease  2. Gait disturbance  3. Restless leg syndrome  The patient is extremely inactive in the home environment, he has little motivation  toward increasing mobility. The patient has agreed to try physical therapy. I will get this set up. The Sinemet will be increased to 1.5 tablets in the morning and evening, one tablet at midday. The cogentin will be reduced taking 0.5 mg twice daily. He will follow-up in 4 months. Hopefully, the increase in the Sinemet will help the restless leg syndrome.  Marlan Palau. Keith Jing Howatt MD 02/04/2016 4:20 PM  [facility] [address] [place], [place] [id number]  Phone [phone] Fax [phone]

## 2016-02-05 ENCOUNTER — Telehealth: Payer: Self-pay

## 2016-02-05 NOTE — Telephone Encounter (Addendum)
[facility] [address]  [address]    [place], [place] [address]     [phone] Tried to call above # but no answer.

## 2016-02-05 NOTE — Telephone Encounter (Signed)
-----   Message from York Spanielharles K Willis, MD sent at 02/04/2016  4:52 PM EST ----- I have ordered home health physical therapy, [facility]

## 2016-02-07 NOTE — Telephone Encounter (Signed)
[facility] # [phone]

## 2016-02-11 NOTE — Telephone Encounter (Addendum)
Returned call and spoke to [place], [facility]. Gave VO for PT as requested. Verbalized understanding and appreciation for call.

## 2016-02-11 NOTE — Telephone Encounter (Signed)
Byrd HesselbachMaria, [facility] [phone] called to advise, patient has been seen and evaluated last Friday November 10th, request verbal orders for home PT, 1 x week for 1 week, 2 x week for 7 weeks for strengthening, transfers, balance and gait training.

## 2016-02-20 ENCOUNTER — Other Ambulatory Visit: Payer: Self-pay | Admitting: Cardiology

## 2016-02-21 ENCOUNTER — Emergency Department (HOSPITAL_COMMUNITY): Payer: Medicare Other

## 2016-02-21 ENCOUNTER — Inpatient Hospital Stay (HOSPITAL_COMMUNITY)
Admission: EM | Admit: 2016-02-21 | Discharge: 2016-03-31 | DRG: 871 | Disposition: E | Payer: Medicare Other | Attending: Internal Medicine | Admitting: Internal Medicine

## 2016-02-21 ENCOUNTER — Encounter (HOSPITAL_COMMUNITY): Payer: Self-pay

## 2016-02-21 DIAGNOSIS — R1310 Dysphagia, unspecified: Secondary | ICD-10-CM | POA: Diagnosis present

## 2016-02-21 DIAGNOSIS — G9340 Encephalopathy, unspecified: Secondary | ICD-10-CM | POA: Diagnosis not present

## 2016-02-21 DIAGNOSIS — N179 Acute kidney failure, unspecified: Secondary | ICD-10-CM | POA: Diagnosis present

## 2016-02-21 DIAGNOSIS — Z7902 Long term (current) use of antithrombotics/antiplatelets: Secondary | ICD-10-CM

## 2016-02-21 DIAGNOSIS — Z7984 Long term (current) use of oral hypoglycemic drugs: Secondary | ICD-10-CM

## 2016-02-21 DIAGNOSIS — A419 Sepsis, unspecified organism: Secondary | ICD-10-CM | POA: Insufficient documentation

## 2016-02-21 DIAGNOSIS — I5022 Chronic systolic (congestive) heart failure: Secondary | ICD-10-CM | POA: Diagnosis present

## 2016-02-21 DIAGNOSIS — Z1611 Resistance to penicillins: Secondary | ICD-10-CM | POA: Diagnosis present

## 2016-02-21 DIAGNOSIS — E785 Hyperlipidemia, unspecified: Secondary | ICD-10-CM | POA: Diagnosis present

## 2016-02-21 DIAGNOSIS — Z833 Family history of diabetes mellitus: Secondary | ICD-10-CM | POA: Diagnosis not present

## 2016-02-21 DIAGNOSIS — F329 Major depressive disorder, single episode, unspecified: Secondary | ICD-10-CM | POA: Diagnosis present

## 2016-02-21 DIAGNOSIS — N390 Urinary tract infection, site not specified: Secondary | ICD-10-CM | POA: Diagnosis present

## 2016-02-21 DIAGNOSIS — G2581 Restless legs syndrome: Secondary | ICD-10-CM | POA: Diagnosis present

## 2016-02-21 DIAGNOSIS — R0682 Tachypnea, not elsewhere classified: Secondary | ICD-10-CM

## 2016-02-21 DIAGNOSIS — I2510 Atherosclerotic heart disease of native coronary artery without angina pectoris: Secondary | ICD-10-CM | POA: Diagnosis present

## 2016-02-21 DIAGNOSIS — H409 Unspecified glaucoma: Secondary | ICD-10-CM | POA: Diagnosis present

## 2016-02-21 DIAGNOSIS — I130 Hypertensive heart and chronic kidney disease with heart failure and stage 1 through stage 4 chronic kidney disease, or unspecified chronic kidney disease: Secondary | ICD-10-CM | POA: Diagnosis present

## 2016-02-21 DIAGNOSIS — I509 Heart failure, unspecified: Secondary | ICD-10-CM | POA: Diagnosis not present

## 2016-02-21 DIAGNOSIS — F209 Schizophrenia, unspecified: Secondary | ICD-10-CM | POA: Diagnosis not present

## 2016-02-21 DIAGNOSIS — A4151 Sepsis due to Escherichia coli [E. coli]: Principal | ICD-10-CM | POA: Diagnosis present

## 2016-02-21 DIAGNOSIS — R0600 Dyspnea, unspecified: Secondary | ICD-10-CM

## 2016-02-21 DIAGNOSIS — N183 Chronic kidney disease, stage 3 (moderate): Secondary | ICD-10-CM | POA: Diagnosis present

## 2016-02-21 DIAGNOSIS — R4182 Altered mental status, unspecified: Secondary | ICD-10-CM | POA: Diagnosis not present

## 2016-02-21 DIAGNOSIS — R6510 Systemic inflammatory response syndrome (SIRS) of non-infectious origin without acute organ dysfunction: Secondary | ICD-10-CM | POA: Diagnosis present

## 2016-02-21 DIAGNOSIS — G9341 Metabolic encephalopathy: Secondary | ICD-10-CM | POA: Diagnosis present

## 2016-02-21 DIAGNOSIS — Z87891 Personal history of nicotine dependence: Secondary | ICD-10-CM

## 2016-02-21 DIAGNOSIS — R310 Gross hematuria: Secondary | ICD-10-CM | POA: Diagnosis present

## 2016-02-21 DIAGNOSIS — R0902 Hypoxemia: Secondary | ICD-10-CM | POA: Diagnosis present

## 2016-02-21 DIAGNOSIS — Z8249 Family history of ischemic heart disease and other diseases of the circulatory system: Secondary | ICD-10-CM | POA: Diagnosis not present

## 2016-02-21 DIAGNOSIS — F200 Paranoid schizophrenia: Secondary | ICD-10-CM | POA: Diagnosis present

## 2016-02-21 DIAGNOSIS — G20 Parkinson's disease: Secondary | ICD-10-CM | POA: Diagnosis present

## 2016-02-21 DIAGNOSIS — I5020 Unspecified systolic (congestive) heart failure: Secondary | ICD-10-CM

## 2016-02-21 DIAGNOSIS — Z66 Do not resuscitate: Secondary | ICD-10-CM | POA: Diagnosis not present

## 2016-02-21 DIAGNOSIS — M109 Gout, unspecified: Secondary | ICD-10-CM | POA: Diagnosis present

## 2016-02-21 DIAGNOSIS — E872 Acidosis: Secondary | ICD-10-CM | POA: Diagnosis present

## 2016-02-21 DIAGNOSIS — I255 Ischemic cardiomyopathy: Secondary | ICD-10-CM | POA: Diagnosis present

## 2016-02-21 DIAGNOSIS — K219 Gastro-esophageal reflux disease without esophagitis: Secondary | ICD-10-CM | POA: Diagnosis present

## 2016-02-21 DIAGNOSIS — R918 Other nonspecific abnormal finding of lung field: Secondary | ICD-10-CM

## 2016-02-21 DIAGNOSIS — E1122 Type 2 diabetes mellitus with diabetic chronic kidney disease: Secondary | ICD-10-CM | POA: Diagnosis present

## 2016-02-21 DIAGNOSIS — I5023 Acute on chronic systolic (congestive) heart failure: Secondary | ICD-10-CM | POA: Diagnosis not present

## 2016-02-21 DIAGNOSIS — Z951 Presence of aortocoronary bypass graft: Secondary | ICD-10-CM | POA: Diagnosis not present

## 2016-02-21 DIAGNOSIS — Z515 Encounter for palliative care: Secondary | ICD-10-CM | POA: Diagnosis not present

## 2016-02-21 DIAGNOSIS — Z79899 Other long term (current) drug therapy: Secondary | ICD-10-CM

## 2016-02-21 DIAGNOSIS — I10 Essential (primary) hypertension: Secondary | ICD-10-CM | POA: Diagnosis not present

## 2016-02-21 DIAGNOSIS — J189 Pneumonia, unspecified organism: Secondary | ICD-10-CM | POA: Diagnosis present

## 2016-02-21 LAB — CBC WITH DIFFERENTIAL/PLATELET
Basophils Absolute: 0 10*3/uL (ref 0.0–0.1)
Basophils Relative: 0 %
EOS PCT: 0 %
Eosinophils Absolute: 0 10*3/uL (ref 0.0–0.7)
HCT: 30.9 % — ABNORMAL LOW (ref 39.0–52.0)
HEMOGLOBIN: 10.1 g/dL — AB (ref 13.0–17.0)
LYMPHS ABS: 1.1 10*3/uL (ref 0.7–4.0)
LYMPHS PCT: 12 %
MCH: 30.6 pg (ref 26.0–34.0)
MCHC: 32.7 g/dL (ref 30.0–36.0)
MCV: 93.6 fL (ref 78.0–100.0)
Monocytes Absolute: 0.9 10*3/uL (ref 0.1–1.0)
Monocytes Relative: 9 %
Neutro Abs: 7.5 10*3/uL (ref 1.7–7.7)
Neutrophils Relative %: 79 %
PLATELETS: 207 10*3/uL (ref 150–400)
RBC: 3.3 MIL/uL — AB (ref 4.22–5.81)
RDW: 14.9 % (ref 11.5–15.5)
WBC: 9.6 10*3/uL (ref 4.0–10.5)

## 2016-02-21 LAB — COMPREHENSIVE METABOLIC PANEL
ALBUMIN: 2.8 g/dL — AB (ref 3.5–5.0)
ALT: 11 U/L — ABNORMAL LOW (ref 17–63)
AST: 77 U/L — AB (ref 15–41)
Alkaline Phosphatase: 75 U/L (ref 38–126)
Anion gap: 9 (ref 5–15)
BUN: 27 mg/dL — AB (ref 6–20)
CHLORIDE: 101 mmol/L (ref 101–111)
CO2: 22 mmol/L (ref 22–32)
Calcium: 8.1 mg/dL — ABNORMAL LOW (ref 8.9–10.3)
Creatinine, Ser: 1.95 mg/dL — ABNORMAL HIGH (ref 0.61–1.24)
GFR calc Af Amer: 36 mL/min — ABNORMAL LOW (ref >60)
GFR, EST NON AFRICAN AMERICAN: 31 mL/min — AB (ref >60)
GLUCOSE: 191 mg/dL — AB (ref 65–99)
POTASSIUM: 3.6 mmol/L (ref 3.5–5.1)
SODIUM: 132 mmol/L — AB (ref 135–145)
Total Bilirubin: 1.6 mg/dL — ABNORMAL HIGH (ref 0.3–1.2)
Total Protein: 6.2 g/dL — ABNORMAL LOW (ref 6.5–8.1)

## 2016-02-21 LAB — URINALYSIS, ROUTINE W REFLEX MICROSCOPIC
Glucose, UA: NEGATIVE mg/dL
Ketones, ur: NEGATIVE mg/dL
NITRITE: POSITIVE — AB
PROTEIN: NEGATIVE mg/dL
Specific Gravity, Urine: 1.013 (ref 1.005–1.030)
pH: 5.5 (ref 5.0–8.0)

## 2016-02-21 LAB — I-STAT CG4 LACTIC ACID, ED: LACTIC ACID, VENOUS: 1.75 mmol/L (ref 0.5–1.9)

## 2016-02-21 LAB — CBG MONITORING, ED: Glucose-Capillary: 170 mg/dL — ABNORMAL HIGH (ref 65–99)

## 2016-02-21 LAB — PROCALCITONIN: Procalcitonin: 0.28 ng/mL

## 2016-02-21 LAB — URINE MICROSCOPIC-ADD ON

## 2016-02-21 LAB — GLUCOSE, CAPILLARY: Glucose-Capillary: 112 mg/dL — ABNORMAL HIGH (ref 65–99)

## 2016-02-21 LAB — MRSA PCR SCREENING: MRSA BY PCR: NEGATIVE

## 2016-02-21 MED ORDER — ACETAMINOPHEN 500 MG PO TABS
1000.0000 mg | ORAL_TABLET | Freq: Once | ORAL | Status: AC
Start: 2016-02-21 — End: 2016-02-21
  Administered 2016-02-21: 1000 mg via ORAL
  Filled 2016-02-21: qty 2

## 2016-02-21 MED ORDER — ACETAMINOPHEN 325 MG PO TABS
650.0000 mg | ORAL_TABLET | Freq: Four times a day (QID) | ORAL | Status: DC | PRN
  Administered 2016-02-28: 650 mg via ORAL
  Filled 2016-02-21: qty 2

## 2016-02-21 MED ORDER — INSULIN ASPART 100 UNIT/ML ~~LOC~~ SOLN: 0.0000 [IU] | Freq: Every day | SUBCUTANEOUS | Status: DC

## 2016-02-21 MED ORDER — QUETIAPINE FUMARATE 25 MG PO TABS
50.0000 mg | ORAL_TABLET | Freq: Three times a day (TID) | ORAL | Status: DC
  Administered 2016-02-21: 50 mg via ORAL
  Filled 2016-02-21 (×3): qty 2

## 2016-02-21 MED ORDER — DORZOLAMIDE HCL-TIMOLOL MAL 2-0.5 % OP SOLN
1.0000 [drp] | Freq: Two times a day (BID) | OPHTHALMIC | Status: DC
  Filled 2016-02-21: qty 10

## 2016-02-21 MED ORDER — ALLOPURINOL 300 MG PO TABS
300.0000 mg | ORAL_TABLET | Freq: Every evening | ORAL | Status: DC
  Administered 2016-02-21 – 2016-02-28 (×7): 300 mg via ORAL
  Filled 2016-02-21: qty 1
  Filled 2016-02-21: qty 3
  Filled 2016-02-21 (×5): qty 1

## 2016-02-21 MED ORDER — DEXTROSE 5 % IV SOLN
500.0000 mg | INTRAVENOUS | Status: DC
  Filled 2016-02-21: qty 500

## 2016-02-21 MED ORDER — QUETIAPINE FUMARATE 50 MG PO TABS
50.0000 mg | ORAL_TABLET | Freq: Every day | ORAL | Status: DC
Start: 2016-02-21 — End: 2016-03-01
  Administered 2016-02-21 – 2016-02-28 (×7): 50 mg via ORAL
  Filled 2016-02-21 (×7): qty 1

## 2016-02-21 MED ORDER — ACETAMINOPHEN 650 MG RE SUPP
650.0000 mg | Freq: Four times a day (QID) | RECTAL | Status: DC | PRN
  Administered 2016-03-01: 650 mg via RECTAL
  Filled 2016-02-21 (×2): qty 1

## 2016-02-21 MED ORDER — QUETIAPINE FUMARATE 25 MG PO TABS
25.0000 mg | ORAL_TABLET | Freq: Two times a day (BID) | ORAL | Status: DC
  Administered 2016-02-22 – 2016-02-24 (×5): 25 mg via ORAL
  Filled 2016-02-21 (×5): qty 1

## 2016-02-21 MED ORDER — FUROSEMIDE 40 MG PO TABS: 40.0000 mg | ORAL_TABLET | Freq: Every day | ORAL | Status: DC

## 2016-02-21 MED ORDER — HEPARIN SODIUM (PORCINE) 5000 UNIT/ML IJ SOLN
5000.0000 [IU] | Freq: Three times a day (TID) | INTRAMUSCULAR | Status: DC
  Administered 2016-02-21 – 2016-03-01 (×26): 5000 [IU] via SUBCUTANEOUS
  Filled 2016-02-21 (×24): qty 1

## 2016-02-21 MED ORDER — DEXTROSE 5 % IV SOLN
500.0000 mg | Freq: Once | INTRAVENOUS | Status: AC
  Administered 2016-02-21: 500 mg via INTRAVENOUS
  Filled 2016-02-21: qty 500

## 2016-02-21 MED ORDER — SODIUM CHLORIDE 0.9 % IV BOLUS (SEPSIS)
500.0000 mL | Freq: Once | INTRAVENOUS | Status: AC
  Administered 2016-02-21: 500 mL via INTRAVENOUS

## 2016-02-21 MED ORDER — LATANOPROST 0.005 % OP SOLN
1.0000 [drp] | Freq: Every day | OPHTHALMIC | Status: DC
  Administered 2016-02-23 – 2016-02-29 (×9): 1 [drp] via OPHTHALMIC
  Filled 2016-02-21 (×3): qty 2.5

## 2016-02-21 MED ORDER — SODIUM CHLORIDE 0.9 % IV SOLN
INTRAVENOUS | Status: DC
  Administered 2016-02-21: 18:00:00 via INTRAVENOUS
  Administered 2016-02-22: 1000 mL via INTRAVENOUS

## 2016-02-21 MED ORDER — SODIUM CHLORIDE 0.9 % IV BOLUS (SEPSIS)
1000.0000 mL | Freq: Once | INTRAVENOUS | Status: AC
  Administered 2016-02-21: 1000 mL via INTRAVENOUS

## 2016-02-21 MED ORDER — ONDANSETRON HCL 4 MG PO TABS: 4.0000 mg | ORAL_TABLET | Freq: Four times a day (QID) | ORAL | Status: DC | PRN

## 2016-02-21 MED ORDER — DEXTROSE 5 % IV SOLN
1.0000 g | INTRAVENOUS | Status: DC
  Administered 2016-02-21: 1 g via INTRAVENOUS
  Filled 2016-02-21: qty 10

## 2016-02-21 MED ORDER — INSULIN ASPART 100 UNIT/ML ~~LOC~~ SOLN
0.0000 [IU] | Freq: Three times a day (TID) | SUBCUTANEOUS | Status: DC
  Administered 2016-02-21: 2 [IU] via SUBCUTANEOUS
  Administered 2016-02-23 – 2016-02-25 (×4): 1 [IU] via SUBCUTANEOUS
  Administered 2016-02-25: 2 [IU] via SUBCUTANEOUS
  Administered 2016-02-26: 1 [IU] via SUBCUTANEOUS
  Administered 2016-02-27: 2 [IU] via SUBCUTANEOUS
  Administered 2016-02-29 (×2): 1 [IU] via SUBCUTANEOUS
  Filled 2016-02-21: qty 1

## 2016-02-21 MED ORDER — PANTOPRAZOLE SODIUM 40 MG PO TBEC
40.0000 mg | DELAYED_RELEASE_TABLET | Freq: Every day | ORAL | Status: DC
Start: 2016-02-21 — End: 2016-02-27
  Administered 2016-02-21 – 2016-02-27 (×7): 40 mg via ORAL
  Filled 2016-02-21 (×7): qty 1

## 2016-02-21 MED ORDER — SERTRALINE HCL 25 MG PO TABS
25.0000 mg | ORAL_TABLET | Freq: Every day | ORAL | Status: DC
  Administered 2016-02-21 – 2016-02-28 (×7): 25 mg via ORAL
  Filled 2016-02-21 (×7): qty 1

## 2016-02-21 MED ORDER — CARBIDOPA-LEVODOPA ER 25-100 MG PO TBCR
1.0000 | EXTENDED_RELEASE_TABLET | Freq: Every day | ORAL | Status: DC
  Administered 2016-02-22 – 2016-02-27 (×5): 1 via ORAL
  Filled 2016-02-21 (×9): qty 1

## 2016-02-21 MED ORDER — SIMVASTATIN 20 MG PO TABS
20.0000 mg | ORAL_TABLET | Freq: Every day | ORAL | Status: DC
  Administered 2016-02-21: 20 mg via ORAL
  Filled 2016-02-21 (×2): qty 1

## 2016-02-21 MED ORDER — DEXTROSE 5 % IV SOLN
1.0000 g | INTRAVENOUS | Status: DC
  Filled 2016-02-21: qty 10

## 2016-02-21 MED ORDER — ONDANSETRON HCL 4 MG/2ML IJ SOLN: 4.0000 mg | Freq: Four times a day (QID) | INTRAMUSCULAR | Status: DC | PRN

## 2016-02-21 MED ORDER — CLOPIDOGREL BISULFATE 75 MG PO TABS
75.0000 mg | ORAL_TABLET | Freq: Every day | ORAL | Status: DC
  Administered 2016-02-21 – 2016-02-27 (×7): 75 mg via ORAL
  Filled 2016-02-21 (×8): qty 1

## 2016-02-21 MED ORDER — BRIMONIDINE TARTRATE 0.2 % OP SOLN
1.0000 [drp] | Freq: Three times a day (TID) | OPHTHALMIC | Status: DC
  Administered 2016-02-21 – 2016-03-01 (×28): 1 [drp] via OPHTHALMIC
  Filled 2016-02-21: qty 5

## 2016-02-21 MED ORDER — OXYCODONE HCL 5 MG PO TABS
5.0000 mg | ORAL_TABLET | ORAL | Status: DC | PRN
  Administered 2016-02-28: 5 mg via ORAL
  Filled 2016-02-21: qty 1

## 2016-02-21 MED ORDER — POTASSIUM CHLORIDE 20 MEQ/15ML (10%) PO SOLN
20.0000 meq | Freq: Three times a day (TID) | ORAL | Status: DC
  Administered 2016-02-21 (×2): 20 meq via ORAL
  Filled 2016-02-21 (×2): qty 15

## 2016-02-21 MED ORDER — QUETIAPINE FUMARATE ER 50 MG PO TB24
50.0000 mg | ORAL_TABLET | ORAL | Status: DC
  Filled 2016-02-21: qty 1

## 2016-02-21 MED ORDER — BENZTROPINE MESYLATE 0.5 MG PO TABS
0.5000 mg | ORAL_TABLET | Freq: Two times a day (BID) | ORAL | Status: DC
  Administered 2016-02-22 – 2016-02-28 (×12): 0.5 mg via ORAL
  Filled 2016-02-21 (×19): qty 1

## 2016-02-21 MED ORDER — CARBIDOPA-LEVODOPA ER 25-100 MG PO TBCR
1.5000 | EXTENDED_RELEASE_TABLET | Freq: Two times a day (BID) | ORAL | Status: DC
  Administered 2016-02-21 – 2016-02-28 (×14): 1.5 via ORAL
  Filled 2016-02-21 (×17): qty 1.5

## 2016-02-21 NOTE — ED Provider Notes (Signed)
[facility]-EMERGENCY DEPT Provider Note   CSN: [id number] Arrival date & time: 02/28/2016  1252     History   Chief Complaint Chief Complaint  Patient presents with  . Altered Mental Status    HPI Terrance Perez is a 82 y.o. male.  HPI  82 year old male presents with altered mental status. History taken from the wife. Patient has been having a cough for about one week. Diarrhea for 2 weeks. He has been progressively more and more confused which is intermittent over the last 1 week or so. She has noticed increased work of breathing over the last 24 hours. No known fevers. Chronically urinary incontinent.  Past Medical History:  Diagnosis Date  . Abnormality of gait 12/02/2012  . Coronary artery disease   . CVD (cerebrovascular disease)   . Diabetes mellitus   . Diastolic dysfunction   . Dysphagia, pharyngoesophageal phase 06/08/2014   Thickened fluids  . Dyspnea   . Fracture, humerus    right  . Glaucoma   . Gout   . History of orthostatic hypotension   . Hyperlipidemia   . Hypertension   . Obesity   . Paranoid schizophrenia (HCC)   . Parkinsonism (HCC) 12/02/2012  . PNA (pneumonia) May 2015  . RLS (restless legs syndrome) 02/04/2016  . Tremor     Patient Active Problem List   Diagnosis Date Noted  . Sepsis (HCC) 02/18/2016  . RLS (restless legs syndrome) 02/04/2016  . Cardiomyopathy, ischemic 10/26/2015  . Chronic renal insufficiency, stage III (moderate) 10/26/2015  . Pain in the chest   . Chest pain 07/18/2014  . Dysphagia, pharyngoesophageal phase 06/08/2014  . Pneumonia 08/04/2013  . Chronic diastolic CHF (congestive heart failure) (HCC) 05/11/2013  . Parkinsonism (HCC) 12/02/2012  . Abnormality of gait 12/02/2012  . Syncope 12/12/2010  . Diabetes mellitus type II, non insulin dependent (HCC) 09/21/2010  . Hx of CABG   . Hypertension   . Hyperlipidemia   . CVD (cerebrovascular disease)   . Diastolic dysfunction     Past Surgical History:  Procedure  Laterality Date  . ASD REPAIR    . CARDIAC CATHETERIZATION  08/31/2006   MODERATE INFERIOR WALL HYPOKINESIA WITH EF 45%  . CHOLECYSTECTOMY    . CORONARY ARTERY BYPASS GRAFT  2008   LIMA GRAFT TO THE LAD, SAPHENOUS VEIN GRAFT TO THE DIAGONAL , SEQUENTIAL SAPHENOUS VEIN GRAFT TO THE PDA, POSTERIOR LATERAL , AND OBTUSE MARGINAL VESSELS.  . TONSILLECTOMY         Home Medications    Prior to Admission medications   Medication Sig Start Date End Date Taking? Authorizing Provider  allopurinol (ZYLOPRIM) 300 MG tablet Take 300 mg by mouth every evening.    Yes Historical Provider, MD  benztropine (COGENTIN) 0.5 MG tablet Take 1 tablet by mouth 2 (two) times daily. 01/04/16  Yes Historical Provider, MD  brimonidine (ALPHAGAN) 0.2 % ophthalmic solution Place 1 drop into both eyes 3 (three) times daily.  07/17/14  Yes Historical Provider, MD  Carbidopa-Levodopa ER (SINEMET CR) 25-100 MG tablet controlled release 1.5 tablets on the first and third doses, 1 tablet on the second dose Patient taking differently: Take 1-1.5 tablets by mouth 3 (three) times daily. 1.5 tablets on the first and third doses, 1 tablet on the second dose 02/04/16  Yes York Spanielharles K Willis, MD  carvedilol (COREG) 12.5 MG tablet TAKE ONE-HALF TABLET BY MOUTH TWICE DAILY 02/20/16  Yes Peter M Swazil[place]Jordan, MD  clopidogrel (PLAVIX) 75 MG tablet TAKE ONE TABLET  BY MOUTH ONCE DAILY 09/27/15  Yes York Spaniel, MD  dorzolamide-timolol (COSOPT) 22.3-6.8 MG/ML ophthalmic solution Place 1 drop into both eyes 2 (two) times daily.   Yes Historical Provider, MD  furosemide (LASIX) 40 MG tablet Take 40 mg by mouth daily.   Yes Historical Provider, MD  glipiZIDE-metformin (METAGLIP) 5-500 MG per tablet Take 1 tablet by mouth daily.  10/26/13  Yes Historical Provider, MD  latanoprost (XALATAN) 0.005 % ophthalmic solution Place 1 drop into both eyes at bedtime.    Yes Historical Provider, MD  losartan (COZAAR) 100 MG tablet Take 0.5 tablets (50 mg total) by  mouth daily. 10/26/15  Yes Luke K Kilroy, PA-C  pantoprazole (PROTONIX) 40 MG tablet Take 40 mg by mouth daily.   Yes Historical Provider, MD  polyethylene glycol (MIRALAX / GLYCOLAX) packet Take 17 g by mouth daily as needed. For constipation   Yes Historical Provider, MD  potassium chloride 20 MEQ/15ML (10%) SOLN Take 15 mLs (20 mEq total) by mouth 3 (three) times daily. 03/29/15  Yes Peter M [place], MD  QUEtiapine (SEROQUEL) 50 MG tablet 1/2 tablet in the morning and midday, one tablet in the evening Patient taking differently: Take 50 mg by mouth 3 (three) times daily.  08/15/15  Yes York Spaniel, MD  sertraline (ZOLOFT) 25 MG tablet Take 25 mg by mouth at bedtime.   Yes Historical Provider, MD  simvastatin (ZOCOR) 20 MG tablet Take 1 tablet (20 mg total) by mouth daily at 6 PM. 04/26/15  Yes Peter M [place], MD  vitamin E (ALPH-E) 400 UNIT capsule Take 400 Units by mouth 2 (two) times daily. Afternoon and bedtime   Yes Historical Provider, MD    Family History Family History  Problem Relation Age of Onset  . Diabetes Father   . Cystic fibrosis Father   . Heart disease Father     Social History Social History  Substance Use Topics  . Smoking status: Former Smoker    Quit date: 03/31/1958  . Smokeless tobacco: Never Used     Comment: quit 1960's  . Alcohol use No     Allergies   Patient has no known allergies.   Review of Systems Review of Systems  Constitutional: Negative for fever.  Respiratory: Positive for cough and shortness of breath.   Gastrointestinal: Positive for diarrhea. Negative for abdominal pain and vomiting.  Genitourinary: Negative for dysuria.  Psychiatric/Behavioral: Positive for confusion.  All other systems reviewed and are negative.    Physical Exam Updated Vital Signs BP 102/70   Pulse 73   Temp 99.5 F (37.5 C) (Rectal)   Resp 24   Ht 5\' 7"  (1.702 m)   Wt 185 lb (83.9 kg)   SpO2 99%   BMI 28.98 kg/m   Physical Exam  Constitutional:  He appears well-developed and well-nourished.  HENT:  Head: Normocephalic and atraumatic.  Right Ear: External ear normal.  Left Ear: External ear normal.  Nose: Nose normal.  Mouth/Throat: Mucous membranes are dry.  Eyes: Right eye exhibits no discharge. Left eye exhibits no discharge.  Neck: Neck supple.  Cardiovascular: Normal rate, regular rhythm and normal heart sounds.   Pulmonary/Chest: Effort normal. He has rales (left sided).  Abdominal: Soft. There is no tenderness.  Musculoskeletal: He exhibits no edema.  Neurological: He is alert. He is disoriented.  Awake but lethargic. Knows place and day of week. Equal strength in all 4 extremities  Skin: Skin is warm and dry.  Nursing note and  vitals reviewed.    ED Treatments / Results  Labs (all labs ordered are listed, but only abnormal results are displayed) Labs Reviewed  COMPREHENSIVE METABOLIC PANEL - Abnormal; Notable for the following:       Result Value   Sodium 132 (*)    Glucose, Bld 191 (*)    BUN 27 (*)    Creatinine, Ser 1.95 (*)    Calcium 8.1 (*)    Total Protein 6.2 (*)    Albumin 2.8 (*)    AST 77 (*)    ALT 11 (*)    Total Bilirubin 1.6 (*)    GFR calc non Af Amer 31 (*)    GFR calc Af Amer 36 (*)    All other components within normal limits  CBC WITH DIFFERENTIAL/PLATELET - Abnormal; Notable for the following:    RBC 3.30 (*)    Hemoglobin 10.1 (*)    HCT 30.9 (*)    All other components within normal limits  URINALYSIS, ROUTINE W REFLEX MICROSCOPIC (NOT AT [facility]) - Abnormal; Notable for the following:    Color, Urine AMBER (*)    APPearance TURBID (*)    Hgb urine dipstick LARGE (*)    Bilirubin Urine SMALL (*)    Nitrite POSITIVE (*)    Leukocytes, UA LARGE (*)    All other components within normal limits  URINE MICROSCOPIC-ADD ON - Abnormal; Notable for the following:    Squamous Epithelial / LPF 0-5 (*)    Bacteria, UA MANY (*)    All other components within normal limits  CULTURE, BLOOD  (ROUTINE X 2)  CULTURE, BLOOD (ROUTINE X 2)  URINE CULTURE  I-STAT CG4 LACTIC ACID, ED  I-STAT CG4 LACTIC ACID, ED    EKG  EKG Interpretation None       Radiology Dg Chest Portable 1 View  Result Date: 02/17/2016 CLINICAL DATA:  Fever with altered mental status EXAM: PORTABLE CHEST 1 VIEW COMPARISON:  07/18/2014 FINDINGS: AP portable semi-erect view of the chest demonstrates median sternotomy wires. Markedly low lung volumes. There is stable cardiomegaly with atherosclerosis. Mild central vascular congestion. Interval increase in interstitial and airspace opacity in the left lower lung zone which may represent infiltrate. No pneumothorax. IMPRESSION: 1. Low lung volumes. 2. Stable mild cardiomegaly with mild central vascular congestion. 3. Increased interstitial and airspace opacity in the left mid to lower lung zone, may represent superimposed infiltrate. Electronically Signed   By: Jasmine PangKim  Fujinaga M.D.   On: 02/08/2016 14:30    Procedures Procedures (including critical care time)  Medications Ordered in ED Medications  cefTRIAXone (ROCEPHIN) 1 g in dextrose 5 % 50 mL IVPB (0 g Intravenous Stopped 02/24/2016 1518)  QUEtiapine (SEROQUEL) tablet 50 mg (50 mg Oral Given 02/27/2016 1617)  sodium chloride 0.9 % bolus 1,000 mL (0 mLs Intravenous Stopped 02/19/2016 1551)    And  sodium chloride 0.9 % bolus 1,000 mL (0 mLs Intravenous Stopped 02/19/2016 1551)    And  sodium chloride 0.9 % bolus 500 mL (0 mLs Intravenous Stopped 02/01/2016 1520)  acetaminophen (TYLENOL) tablet 1,000 mg (1,000 mg Oral Given 02/05/2016 1408)  azithromycin (ZITHROMAX) 500 mg in dextrose 5 % 250 mL IVPB (500 mg Intravenous New Bag/Given 02/17/2016 1521)     Initial Impression / Assessment and Plan / ED Course  I have reviewed the triage vital signs and the nursing notes.  Pertinent labs & imaging results that were available during my care of the patient were reviewed by me and  considered in my medical decision making  (see chart for details).  Clinical Course as of Feb 20 1629  Thu 02/24/16  1323 Sepsis workup, tylenol, fluids, IV rocephin  [SG]    Clinical Course User Index [SG] Pricilla Loveless, MD    Patient treated with fluids, antibiotics for UTI and CAP (given cough and tachypnea). Admit to hospitalist. No acute airway compromise. No hypotension or signs of severe sepsis.  Final Clinical Impressions(s) / ED Diagnoses   Final diagnoses:  Community acquired pneumonia of left lung, unspecified part of lung  Acute UTI  Encephalopathy acute    New Prescriptions New Prescriptions   No medications on file     Pricilla Loveless, MD February 24, 2016 2138

## 2016-02-21 NOTE — ED Triage Notes (Signed)
Pt. BIB [facility] from home where wife reports change in mental status stating that pt. Baseline is normal speech, has had a change in speech X1 day and sickness X1 week. Pt reports diarrhea X1 week.

## 2016-02-21 NOTE — ED Notes (Signed)
ED Provider at bedside. 

## 2016-02-21 NOTE — ED Notes (Signed)
Pts CBG was 170 notified LobbyistCricket (RN)

## 2016-02-21 NOTE — ED Notes (Signed)
POD C out of Seroquel requested from pharmacy via phone

## 2016-02-21 NOTE — ED Notes (Signed)
XR at bedside

## 2016-02-21 NOTE — ED Notes (Signed)
Wife has gone home, asked to call if anything changes.  Will return tomorrow around noon.

## 2016-02-21 NOTE — H&P (Addendum)
History and Physical    MOHID FURUYA WUJ:[id number] DOB: 26-Jul-1933 DOA: 02/03/2016  PCP: Minda Meo, MD Patient coming from: home  Chief Complaint: confusion  HPI: Terrance Perez is a 82 y.o. male with medical history significant of CAD, CABG, DM, dysphagia, glaucoma, gout, HLD, HTN, schizophrenia, Parkinson's, RLS presenting with cough, confusion, weakness, and gross hematuria. History provided by EDP and patient's wife as patient is unable to provide reliable information at this time.   Patient symptoms started approximately 2 weeks ago with a cough. Became significantly worse over the last week. However, since wife states that over the last day it has somewhat improved. His been associated with generalized weakness over the last 4-5 days which is getting worse along with progressive intermittent confusion. At baseline patient has days from time to time where he is somewhat confused. No formal diagnosis of dementia per wife. Patient also had single episode of gross hematuria that wife describes as almost entirely blood. Occurred approximate 4 days ago. Denies any fevers, abdominal pain, dysuria, frequency, chest pain, palpitations, neck stiffness, headache, LOC, fall.  ED Course: Cysts protocol initiated. Started on IV fluids and it antibiotics.  Review of Systems: As per HPI otherwise 10 point review of systems negative.   Ambulatory Status: Slow but no restrictions  Past Medical History:  Diagnosis Date  . Abnormality of gait 12/02/2012  . Coronary artery disease   . CVD (cerebrovascular disease)   . Diabetes mellitus   . Diastolic dysfunction   . Dysphagia, pharyngoesophageal phase 06/08/2014   Thickened fluids  . Dyspnea   . Fracture, humerus    right  . Glaucoma   . Gout   . History of orthostatic hypotension   . Hyperlipidemia   . Hypertension   . Obesity   . Paranoid schizophrenia (HCC)   . Parkinsonism (HCC) 12/02/2012  . PNA (pneumonia) May 2015  . RLS  (restless legs syndrome) 02/04/2016  . Tremor     Past Surgical History:  Procedure Laterality Date  . ASD REPAIR    . CARDIAC CATHETERIZATION  08/31/2006   MODERATE INFERIOR WALL HYPOKINESIA WITH EF 45%  . CHOLECYSTECTOMY    . CORONARY ARTERY BYPASS GRAFT  2008   LIMA GRAFT TO THE LAD, SAPHENOUS VEIN GRAFT TO THE DIAGONAL , SEQUENTIAL SAPHENOUS VEIN GRAFT TO THE PDA, POSTERIOR LATERAL , AND OBTUSE MARGINAL VESSELS.  . TONSILLECTOMY      Social History   Social History  . Marital status: Married    Spouse name: [name]  . Number of children: 2  . Years of education: GED   Occupational History  . retired Retired    retired   Social History Main Topics  . Smoking status: Former Smoker    Quit date: 03/31/1958  . Smokeless tobacco: Never Used     Comment: quit 1960's  . Alcohol use No  . Drug use: No  . Sexual activity: No   Other Topics Concern  . Not on file   Social History Narrative   Patient is left handed.   Patient occasionally drinks 1 cup of caffeine.    No Known Allergies  Family History  Problem Relation Age of Onset  . Diabetes Father   . Cystic fibrosis Father   . Heart disease Father     Prior to Admission medications   Medication Sig Start Date End Date Taking? Authorizing Provider  allopurinol (ZYLOPRIM) 300 MG tablet Take 300 mg by mouth every evening.    Yes Historical  Provider, MD  benztropine (COGENTIN) 0.5 MG tablet Take 1 tablet by mouth 2 (two) times daily. 01/04/16  Yes Historical Provider, MD  brimonidine (ALPHAGAN) 0.2 % ophthalmic solution Place 1 drop into both eyes 3 (three) times daily.  07/17/14  Yes Historical Provider, MD  Carbidopa-Levodopa ER (SINEMET CR) 25-100 MG tablet controlled release 1.5 tablets on the first and third doses, 1 tablet on the second dose Patient taking differently: Take 1-1.5 tablets by mouth 3 (three) times daily. 1.5 tablets on the first and third doses, 1 tablet on the second dose 02/04/16  Yes York Spanielharles K Willis,  MD  carvedilol (COREG) 12.5 MG tablet TAKE ONE-HALF TABLET BY MOUTH TWICE DAILY 02/20/16  Yes Peter M SwazilandJordan, MD  clopidogrel (PLAVIX) 75 MG tablet TAKE ONE TABLET BY MOUTH ONCE DAILY 09/27/15  Yes York Spanielharles K Willis, MD  dorzolamide-timolol (COSOPT) 22.3-6.8 MG/ML ophthalmic solution Place 1 drop into both eyes 2 (two) times daily.   Yes Historical Provider, MD  furosemide (LASIX) 40 MG tablet Take 40 mg by mouth daily.   Yes Historical Provider, MD  glipiZIDE-metformin (METAGLIP) 5-500 MG per tablet Take 1 tablet by mouth daily.  10/26/13  Yes Historical Provider, MD  latanoprost (XALATAN) 0.005 % ophthalmic solution Place 1 drop into both eyes at bedtime.    Yes Historical Provider, MD  losartan (COZAAR) 100 MG tablet Take 0.5 tablets (50 mg total) by mouth daily. 10/26/15  Yes Luke K Kilroy, PA-C  pantoprazole (PROTONIX) 40 MG tablet Take 40 mg by mouth daily.   Yes Historical Provider, MD  polyethylene glycol (MIRALAX / GLYCOLAX) packet Take 17 g by mouth daily as needed. For constipation   Yes Historical Provider, MD  potassium chloride 20 MEQ/15ML (10%) SOLN Take 15 mLs (20 mEq total) by mouth 3 (three) times daily. 03/29/15  Yes Peter M [place]Jordan, MD  QUEtiapine (SEROQUEL) 50 MG tablet 1/2 tablet in the morning and midday, one tablet in the evening Patient taking differently: Take 50 mg by mouth 3 (three) times daily.  08/15/15  Yes York Spanielharles K Willis, MD  sertraline (ZOLOFT) 25 MG tablet Take 25 mg by mouth at bedtime.   Yes Historical Provider, MD  simvastatin (ZOCOR) 20 MG tablet Take 1 tablet (20 mg total) by mouth daily at 6 PM. 04/26/15  Yes Peter M [place]Jordan, MD  vitamin E (ALPH-E) 400 UNIT capsule Take 400 Units by mouth 2 (two) times daily. Afternoon and bedtime   Yes Historical Provider, MD    Physical Exam: Vitals:   02/15/2016 1515 02/25/2016 1521 02/20/2016 1530 02/10/2016 1545  BP: 116/77  117/70 102/70  Pulse: 75  73 73  Resp: 24     Temp:  99.5 F (37.5 C)    TempSrc:  Rectal    SpO2:  100%  100% 99%  Weight:      Height:         General: Ill-appearing, resting in bed Eyes: PERRL, EOMI, normal lids, iris ENT: dry mm, hard of hearing  Neck:  no LAD, masses or thyromegaly Cardiovascular: faint heart sounds. II/VI systolic murmur, RRR trace bilat LE edema Respiratory: Bilateral crackles in lower lung fields. Mild increased effort. Abdomen:  soft, ntnd, NABS Skin:  no rash or induration seen on limited exam Musculoskeletal: no CVA tenderness.  grossly normal tone BUE/BLE, no bony abnormality Psychiatric: Follows basic commands. Extremely sleepy. Alert and oriented 1 Neurologic:  CN 2-12 grossly intact, moves all extremities in coordinated fashion, sensation intact  Labs on Admission: I have  personally reviewed following labs and imaging studies  CBC:  Recent Labs Lab 02/13/2016 1319  WBC 9.6  NEUTROABS 7.5  HGB 10.1*  HCT 30.9*  MCV 93.6  PLT 207   Basic Metabolic Panel:  Recent Labs Lab 02/12/2016 1319  NA 132*  K 3.6  CL 101  CO2 22  GLUCOSE 191*  BUN 27*  CREATININE 1.95*  CALCIUM 8.1*   GFR: Estimated Creatinine Clearance: 31.8 mL/min (by C-G formula based on SCr of 1.95 mg/dL (H)). Liver Function Tests:  Recent Labs Lab 02/03/2016 1319  AST 77*  ALT 11*  ALKPHOS 75  BILITOT 1.6*  PROT 6.2*  ALBUMIN 2.8*   No results for input(s): LIPASE, AMYLASE in the last 168 hours. No results for input(s): AMMONIA in the last 168 hours. Coagulation Profile: No results for input(s): INR, PROTIME in the last 168 hours. Cardiac Enzymes: No results for input(s): CKTOTAL, CKMB, CKMBINDEX, TROPONINI in the last 168 hours. BNP (last 3 results) No results for input(s): PROBNP in the last 8760 hours. HbA1C: No results for input(s): HGBA1C in the last 72 hours. CBG: No results for input(s): GLUCAP in the last 168 hours. Lipid Profile: No results for input(s): CHOL, HDL, LDLCALC, TRIG, CHOLHDL, LDLDIRECT in the last 72 hours. Thyroid Function Tests: No  results for input(s): TSH, T4TOTAL, FREET4, T3FREE, THYROIDAB in the last 72 hours. Anemia Panel: No results for input(s): VITAMINB12, FOLATE, FERRITIN, TIBC, IRON, RETICCTPCT in the last 72 hours. Urine analysis:    Component Value Date/Time   COLORURINE AMBER (A) 02/13/2016 1402   APPEARANCEUR TURBID (A) 02/26/2016 1402   LABSPEC 1.013 02/07/2016 1402   PHURINE 5.5 02/25/2016 1402   GLUCOSEU NEGATIVE 02/25/2016 1402   HGBUR LARGE (A) 02/26/2016 1402   BILIRUBINUR SMALL (A) 02/26/2016 1402   KETONESUR NEGATIVE 02/13/2016 1402   PROTEINUR NEGATIVE 02/07/2016 1402   UROBILINOGEN 1.0 03/24/2010 1929   NITRITE POSITIVE (A) 02/13/2016 1402   LEUKOCYTESUR LARGE (A) 02/05/2016 1402    Creatinine Clearance: Estimated Creatinine Clearance: 31.8 mL/min (by C-G formula based on SCr of 1.95 mg/dL (H)).  Sepsis Labs: @LABRCNTIP (procalcitonin:4,lacticidven:4) ) Recent Results (from the past 240 hour(s))  Blood Culture (routine x 2)     Status: None (Preliminary result)   Collection Time: 02/17/2016  1:56 PM  Result Value Ref Range Status   Specimen Description BLOOD LEFT FOREARM  Final   Special Requests BOTTLES DRAWN AEROBIC AND ANAEROBIC  5CC  Final   Culture PENDING  Incomplete   Report Status PENDING  Incomplete     Radiological Exams on Admission: Dg Chest Portable 1 View  Result Date: 02/22/2016 CLINICAL DATA:  Fever with altered mental status EXAM: PORTABLE CHEST 1 VIEW COMPARISON:  07/18/2014 FINDINGS: AP portable semi-erect view of the chest demonstrates median sternotomy wires. Markedly low lung volumes. There is stable cardiomegaly with atherosclerosis. Mild central vascular congestion. Interval increase in interstitial and airspace opacity in the left lower lung zone which may represent infiltrate. No pneumothorax. IMPRESSION: 1. Low lung volumes. 2. Stable mild cardiomegaly with mild central vascular congestion. 3. Increased interstitial and airspace opacity in the left mid to  lower lung zone, may represent superimposed infiltrate. Electronically Signed   By: Jasmine Pang M.D.   On: 02/15/2016 14:30     Assessment/Plan Active Problems:   Hypertension   Hyperlipidemia   Parkinsonism (HCC)   Encephalopathy acute   Acute encephalopathy   Systolic congestive heart failure (HCC)   AKI (acute kidney injury) (HCC)   Schizophrenia (  HCC)   SIRS (systemic inflammatory response syndrome) (HCC)   Acute encephalopathy: Likely secondary to undiagnosed underlying dementia with overlying psychiatric conditions including schizophrenia and Parkinson's with acute infectious etiology including UTI and possible pneumonia. Patient does not meet sepsis criteria but doesn't meet SIRS criteria. - stepdown - BCX/ UCX  - continue CTX/Azithro - pneumonia orderset - careful fluid mgt - procalcitonin  HTN: recently downtritrated on HTN medications by PCP. Per wife pts baseline SBP around 110s.  - Hold Coreg, losartan, until BP improves.  - IVF  AKI: Cr 1.95. Baseline 1.4. Likely from poor oral intake and infection causing mild hypoperfusion - gentle IVF - BMP in am  Parkinsons: at baseline - continue Sinemet CR  Depression / Paranoid type Schizophrenia: at baseline - continue seroquel, cogentin, Zoloft  CHF: no evidence of overload at this time. Last echo from 2015 showing EF 30%.  - Resume lasix in am - Echo - strict I/O, Daily weights  CAD/CVD: - continue ASA, Plavix, statin  DM: - SSI  GERD: - continue PPI  Gout: - continue allopurinol  DVT prophylaxis: hep  Code Status: full  Family Communication: wife  Disposition Plan: pending improvement in mental status  Consults called: none  Admission status: inpt - SDU    Linc Renne J MD [facility]  If 7PM-7AM, please contact night-coverage [email or web address] Password [email or web address]  02/19/2016, 5:01 PM

## 2016-02-21 NOTE — ED Notes (Signed)
Report attempted, charge to call back

## 2016-02-21 NOTE — Progress Notes (Signed)
Pharmacy Antibiotic Note  Terrance SabalStephen R Perez is a 82 y.o. male admitted on 02/10/2016 with sepsis likely secondary to UTI.  Pharmacy has been consulted for ceftriaxone dosing.  Plan: Start ceftriaxone 1g IV Q24 Monitor clinical picture F/U C&S, abx deescalation / LOT    Temp (24hrs), Avg:100.8 F (38.2 C), Min:100.8 F (38.2 C), Max:100.8 F (38.2 C)  No results for input(s): WBC, CREATININE, LATICACIDVEN, VANCOTROUGH, VANCOPEAK, VANCORANDOM, GENTTROUGH, GENTPEAK, GENTRANDOM, TOBRATROUGH, TOBRAPEAK, TOBRARND, AMIKACINPEAK, AMIKACINTROU, AMIKACIN in the last 168 hours.  CrCl cannot be calculated (Patient's most recent lab result is older than the maximum 21 days allowed.).    No Known Allergies  Antimicrobials this admission: Ceftriaxone 11/23 >>   Dose adjustments this admission: n/a  Microbiology results: 11/23 BCx: sent 11/23 UCx: sent   Thank you for allowing pharmacy to be a part of this patient's care.  Terrance StammerBATCHELDER,Terrance Perez J 02/26/2016 1:26 PM

## 2016-02-21 NOTE — ED Notes (Signed)
EKG given to Dr. Goldston  

## 2016-02-21 NOTE — ED Notes (Signed)
Reported attempted. 

## 2016-02-22 ENCOUNTER — Other Ambulatory Visit (HOSPITAL_COMMUNITY): Payer: Medicare Other

## 2016-02-22 DIAGNOSIS — R319 Hematuria, unspecified: Secondary | ICD-10-CM

## 2016-02-22 DIAGNOSIS — N390 Urinary tract infection, site not specified: Secondary | ICD-10-CM

## 2016-02-22 LAB — CBC
HCT: 33.1 % — ABNORMAL LOW (ref 39.0–52.0)
Hemoglobin: 10.6 g/dL — ABNORMAL LOW (ref 13.0–17.0)
MCH: 30.7 pg (ref 26.0–34.0)
MCHC: 32 g/dL (ref 30.0–36.0)
MCV: 95.9 fL (ref 78.0–100.0)
PLATELETS: 204 10*3/uL (ref 150–400)
RBC: 3.45 MIL/uL — ABNORMAL LOW (ref 4.22–5.81)
RDW: 15.7 % — ABNORMAL HIGH (ref 11.5–15.5)
WBC: 10.7 10*3/uL — ABNORMAL HIGH (ref 4.0–10.5)

## 2016-02-22 LAB — BLOOD CULTURE ID PANEL (REFLEXED)
Acinetobacter baumannii: NOT DETECTED
CANDIDA ALBICANS: NOT DETECTED
CANDIDA TROPICALIS: NOT DETECTED
Candida glabrata: NOT DETECTED
Candida krusei: NOT DETECTED
Candida parapsilosis: NOT DETECTED
Carbapenem resistance: NOT DETECTED
ENTEROBACTER CLOACAE COMPLEX: NOT DETECTED
ENTEROCOCCUS SPECIES: NOT DETECTED
Enterobacteriaceae species: DETECTED — AB
Escherichia coli: DETECTED — AB
HAEMOPHILUS INFLUENZAE: NOT DETECTED
Klebsiella oxytoca: NOT DETECTED
Klebsiella pneumoniae: NOT DETECTED
LISTERIA MONOCYTOGENES: NOT DETECTED
NEISSERIA MENINGITIDIS: NOT DETECTED
PROTEUS SPECIES: NOT DETECTED
Pseudomonas aeruginosa: NOT DETECTED
SERRATIA MARCESCENS: NOT DETECTED
STAPHYLOCOCCUS SPECIES: NOT DETECTED
STREPTOCOCCUS AGALACTIAE: NOT DETECTED
STREPTOCOCCUS SPECIES: NOT DETECTED
Staphylococcus aureus (BCID): NOT DETECTED
Streptococcus pneumoniae: NOT DETECTED
Streptococcus pyogenes: NOT DETECTED

## 2016-02-22 LAB — GLUCOSE, CAPILLARY
GLUCOSE-CAPILLARY: 86 mg/dL (ref 65–99)
GLUCOSE-CAPILLARY: 126 mg/dL — AB (ref 65–99)
Glucose-Capillary: 83 mg/dL (ref 65–99)
Glucose-Capillary: 112 mg/dL — ABNORMAL HIGH (ref 65–99)

## 2016-02-22 LAB — STREP PNEUMONIAE URINARY ANTIGEN: Strep Pneumo Urinary Antigen: NEGATIVE

## 2016-02-22 MED ORDER — SODIUM CHLORIDE 0.9 % IV SOLN
3.0000 g | Freq: Two times a day (BID) | INTRAVENOUS | Status: DC
  Administered 2016-02-22 – 2016-02-24 (×5): 3 g via INTRAVENOUS
  Filled 2016-02-22 (×5): qty 3

## 2016-02-22 MED ORDER — MORPHINE SULFATE (PF) 2 MG/ML IV SOLN
0.5000 mg | Freq: Once | INTRAVENOUS | Status: AC
  Administered 2016-02-22: 0.5 mg via INTRAVENOUS
  Filled 2016-02-22: qty 1

## 2016-02-22 MED ORDER — TIMOLOL MALEATE 0.5 % OP SOLN
1.0000 [drp] | Freq: Two times a day (BID) | OPHTHALMIC | Status: DC
  Administered 2016-02-22 – 2016-03-01 (×17): 1 [drp] via OPHTHALMIC
  Filled 2016-02-22: qty 5

## 2016-02-22 MED ORDER — RESOURCE THICKENUP CLEAR PO POWD
Freq: Once | ORAL | Status: AC
  Administered 2016-02-22: 17:00:00 via ORAL
  Filled 2016-02-22: qty 125

## 2016-02-22 MED ORDER — PIPERACILLIN-TAZOBACTAM 3.375 G IVPB
3.3750 g | Freq: Three times a day (TID) | INTRAVENOUS | Status: DC
  Filled 2016-02-22 (×3): qty 50

## 2016-02-22 MED ORDER — DORZOLAMIDE HCL 2 % OP SOLN
1.0000 [drp] | Freq: Two times a day (BID) | OPHTHALMIC | Status: DC
  Administered 2016-02-22 – 2016-03-01 (×17): 1 [drp] via OPHTHALMIC
  Filled 2016-02-22: qty 10

## 2016-02-22 NOTE — Progress Notes (Signed)
Bladder scans performed.  First one resulted in 351 ml of urine in bladder; second one was .  Per telephone order, foley cath will be inserted.

## 2016-02-22 NOTE — Progress Notes (Signed)
PROGRESS NOTE    Terrance Perez  [id number]:[id number] DOB: 10-24-1933 DOA: 02/24/2016 PCP: Minda MeoARONSON,RICHARD A, MD   Brief Narrative:  82 yo male with ischemic cardiomyopathy, t2dm, hypertension, schizophrenia and parkinson's, who presents with metabolic encephalopathy and hematuria. On admission confused, febrile and with tachypnea. Urine analysis with pyuria and chest film with left lower lobe infiltrate. Admitted to step down unit with the working diagnosis of sepsis.    Assessment & Plan:   Active Problems:   Hypertension   Hyperlipidemia   Parkinsonism (HCC)   Encephalopathy acute   Acute encephalopathy   Systolic congestive heart failure (HCC)   AKI (acute kidney injury) (HCC)   Schizophrenia (HCC)   SIRS (systemic inflammatory response syndrome) (HCC)   1. Sepsis due to urine infection (present on admission). Will change antibiotic to cover pneumonia and urine infection with unasyn. Noted blood culture positive for e coli. Will continue with IV fluids at 75 cc.hr to prevent volume overload. Follow on temperature curve, repeat cultures and cell count.   2. Possible left lower lobe pneumonia (present on admission). Aspiration precautions and swallow evaluation, will place patient on unasyn to cover anaerobic organism, will continue oxymetry monitoring and supplemental 02 per [place] to keep 02 sat above 92%. Chest film personally reviewed noted left alveolar infiltrates, will follow chest film in am after hydration.   3. AKI on ckd stage 3. Renal function with cr at 1,9 from 1,4. During the day with low urine output, will keep MAP above 65, avoid nephrotoxic medications, will check bladder scan, of no urinary retention will increase IV fluids to 100 cc.hr. K at 3,6 and serum bicarb at 22.   4. T2DM. Capillary glucose 112-86-83. Poor oral intake, will continue glucose cover and monitoring with sliding scale.   5. Systolic heart failure, chronic. Significant reduction on LV systolic  function, will continue clopidogrel for now,. Avoid volume overload. LV function with ejection fraction 30 to 35 % on 2015, will repeat echocardiogram on this admission.   6. HTN. Will continue to hold on coreg, furosemide and losartan. Avoid hypotension.   7. Schizophrenia and parkinson. Patient not agitated, able to respond to questions, will continue sertraline, seroquel, carbidopa-levodopa and cogentin.   DVT prophylaxis: heparin  Code Status: full  Family Communication: no family at the bedside Disposition Plan: home   Consultants:      Procedures:    Antimicrobials:   Azithromycin  Ceftriaxone    Subjective: Patient asking to be turned, no chest pain or dyspnea, no nausea or vomiting.   Objective: Vitals:   02/08/2016 1945 02/20/2016 2000 02/22/16 0007 02/22/16 0302  BP: 119/77 121/79 106/89 123/75  Pulse: 72 72 66 83  Resp:   (!) 25 (!) 29  Temp:   98 F (36.7 C) 97.9 F (36.6 C)  TempSrc:   Axillary Axillary  SpO2: 99% 100% 100% 100%  Weight:      Height:        Intake/Output Summary (Last 24 hours) at 02/22/16 0749 Last data filed at 02/03/2016 1621  Gross per 24 hour  Intake             2800 ml  Output                0 ml  Net             2800 ml   Filed Weights   02/13/2016 1424  Weight: 83.9 kg (185 lb)    Examination:  General exam: deconditioned  and ill looking appearing. E ENT: positive pallor but no icterus. Oral mucosa moist. Respiratory system: . Respiratory effort increased, no use of accessory muscles, positive rales at the left base, no wheezing or rohnchi.  Cardiovascular system: S1 & S2 heard, RRR. No JVD, murmurs, rubs, gallops or clicks. No pedal edema. Gastrointestinal system: Abdomen is mild distended, soft and nontender. No organomegaly or masses felt. Normal bowel sounds heard. Positive umbilical hernia.  Central nervous system: orientated times person and place not time, follows commands.  Extremities: Symmetric 5 x 5 power. Non  focal.  Skin: No rashes, lesions or ulcers.     Data Reviewed: I have personally reviewed following labs and imaging studies  CBC:  Recent Labs Lab 02/14/2016 1319 02/22/16 0556  WBC 9.6 10.7*  NEUTROABS 7.5  --   HGB 10.1* 10.6*  HCT 30.9* 33.1*  MCV 93.6 95.9  PLT 207 204   Basic Metabolic Panel:  Recent Labs Lab 02/18/2016 1319  NA 132*  K 3.6  CL 101  CO2 22  GLUCOSE 191*  BUN 27*  CREATININE 1.95*  CALCIUM 8.1*   GFR: Estimated Creatinine Clearance: 31.8 mL/min (by C-G formula based on SCr of 1.95 mg/dL (H)). Liver Function Tests:  Recent Labs Lab 02/26/2016 1319  AST 77*  ALT 11*  ALKPHOS 75  BILITOT 1.6*  PROT 6.2*  ALBUMIN 2.8*   No results for input(s): LIPASE, AMYLASE in the last 168 hours. No results for input(s): AMMONIA in the last 168 hours. Coagulation Profile: No results for input(s): INR, PROTIME in the last 168 hours. Cardiac Enzymes: No results for input(s): CKTOTAL, CKMB, CKMBINDEX, TROPONINI in the last 168 hours. BNP (last 3 results) No results for input(s): PROBNP in the last 8760 hours. HbA1C: No results for input(s): HGBA1C in the last 72 hours. CBG:  Recent Labs Lab 02/22/2016 1744 02/16/2016 2115  GLUCAP 170* 112*   Lipid Profile: No results for input(s): CHOL, HDL, LDLCALC, TRIG, CHOLHDL, LDLDIRECT in the last 72 hours. Thyroid Function Tests: No results for input(s): TSH, T4TOTAL, FREET4, T3FREE, THYROIDAB in the last 72 hours. Anemia Panel: No results for input(s): VITAMINB12, FOLATE, FERRITIN, TIBC, IRON, RETICCTPCT in the last 72 hours. Sepsis Labs:  Recent Labs Lab 02/09/2016 1353 02/07/2016 1655  PROCALCITON  --  0.28  LATICACIDVEN 1.75  --     Recent Results (from the past 240 hour(s))  Blood Culture (routine x 2)     Status: None (Preliminary result)   Collection Time: 02/28/2016  1:56 PM  Result Value Ref Range Status   Specimen Description BLOOD LEFT FOREARM  Final   Special Requests BOTTLES DRAWN AEROBIC AND  ANAEROBIC  5CC  Final   Culture PENDING  Incomplete   Report Status PENDING  Incomplete  MRSA PCR Screening     Status: None   Collection Time: 02/08/2016 10:30 PM  Result Value Ref Range Status   MRSA by PCR NEGATIVE NEGATIVE Final    Comment:        The GeneXpert MRSA Assay (FDA approved for NASAL specimens only), is one component of a comprehensive MRSA colonization surveillance program. It is not intended to diagnose MRSA infection nor to guide or monitor treatment for MRSA infections.          Radiology Studies: Dg Chest Portable 1 View  Result Date: 02/10/2016 CLINICAL DATA:  Fever with altered mental status EXAM: PORTABLE CHEST 1 VIEW COMPARISON:  07/18/2014 FINDINGS: AP portable semi-erect view of the chest demonstrates median sternotomy wires.  Markedly low lung volumes. There is stable cardiomegaly with atherosclerosis. Mild central vascular congestion. Interval increase in interstitial and airspace opacity in the left lower lung zone which may represent infiltrate. No pneumothorax. IMPRESSION: 1. Low lung volumes. 2. Stable mild cardiomegaly with mild central vascular congestion. 3. Increased interstitial and airspace opacity in the left mid to lower lung zone, may represent superimposed infiltrate. Electronically Signed   By: Jasmine PangKim  Fujinaga M.D.   On: 02/04/2016 14:30        Scheduled Meds: . allopurinol  300 mg Oral QPM  . azithromycin  500 mg Intravenous Q24H  . benztropine  0.5 mg Oral BID  . brimonidine  1 drop Both Eyes TID  . Carbidopa-Levodopa ER  1 tablet Oral Q1500  . Carbidopa-Levodopa ER  1.5 tablet Oral BID  . cefTRIAXone (ROCEPHIN)  IV  1 g Intravenous Q24H  . clopidogrel  75 mg Oral Daily  . dorzolamide  1 drop Both Eyes BID   And  . timolol  1 drop Both Eyes BID  . heparin  5,000 Units Subcutaneous Q8H  . insulin aspart  0-5 Units Subcutaneous QHS  . insulin aspart  0-9 Units Subcutaneous TID WC  . latanoprost  1 drop Both Eyes QHS  .  pantoprazole  40 mg Oral Daily  . potassium chloride  20 mEq Oral TID  . QUEtiapine  25 mg Oral BID WC  . QUEtiapine  50 mg Oral QHS  . sertraline  25 mg Oral QHS  . simvastatin  20 mg Oral q1800   Continuous Infusions: . sodium chloride 50 mL/hr at 02/05/2016 1751     LOS: 1 day      Coralie KeensMauricio Daniel Arrien, MD [facility] Pager [phone]  If 7PM-7AM, please contact night-coverage [email or web address] Password [email or web address] 02/22/2016, 7:49 AM

## 2016-02-22 NOTE — Progress Notes (Signed)
PHARMACY - PHYSICIAN COMMUNICATION CRITICAL VALUE ALERT - BLOOD CULTURE IDENTIFICATION (BCID)  Results for orders placed or performed during the hospital encounter of 25-Jun-2015  Blood Culture ID Panel (Reflexed) (Collected: 2015-12-05  1:19 PM)  Result Value Ref Range   Enterococcus species NOT DETECTED NOT DETECTED   Listeria monocytogenes NOT DETECTED NOT DETECTED   Staphylococcus species NOT DETECTED NOT DETECTED   Staphylococcus aureus NOT DETECTED NOT DETECTED   Streptococcus species NOT DETECTED NOT DETECTED   Streptococcus agalactiae NOT DETECTED NOT DETECTED   Streptococcus pneumoniae NOT DETECTED NOT DETECTED   Streptococcus pyogenes NOT DETECTED NOT DETECTED   Acinetobacter baumannii NOT DETECTED NOT DETECTED   Enterobacteriaceae species DETECTED (A) NOT DETECTED   Enterobacter cloacae complex NOT DETECTED NOT DETECTED   Escherichia coli DETECTED (A) NOT DETECTED   Klebsiella oxytoca NOT DETECTED NOT DETECTED   Klebsiella pneumoniae NOT DETECTED NOT DETECTED   Proteus species NOT DETECTED NOT DETECTED   Serratia marcescens NOT DETECTED NOT DETECTED   Carbapenem resistance NOT DETECTED NOT DETECTED   Haemophilus influenzae NOT DETECTED NOT DETECTED   Neisseria meningitidis NOT DETECTED NOT DETECTED   Pseudomonas aeruginosa NOT DETECTED NOT DETECTED   Candida albicans NOT DETECTED NOT DETECTED   Candida glabrata NOT DETECTED NOT DETECTED   Candida krusei NOT DETECTED NOT DETECTED   Candida parapsilosis NOT DETECTED NOT DETECTED   Candida tropicalis NOT DETECTED NOT DETECTED    Name of physician (or Provider) Contacted: Dr. Ella JubileeArrien  Changes to prescribed antibiotics required: Patient initially on ceftriaxone/azithromycin but changed to zosyn today due to concern for potential aspiration pneumonia. Recommended to discontinue zosyn and de-escalate to Unasyn 3g IV q12h.  Casilda Carlsaylor Shamell Suarez, PharmD. PGY-2 Infectious Diseases Pharmacy Resident Pager: [phone] 02/22/2016  10:17  AM

## 2016-02-22 NOTE — Progress Notes (Signed)
Pharmacy Antibiotic Note  Terrance Perez is a 82 y.o. male admitted on 02/14/2016 with possible sepsis likely secondary to UTI. It was noted that he had a 2 week history of cough. Ceftriaxone and azithromycin were initially started for CAP and UTI, however both were discontinued and pharmacy has been consulted to start zosyn for possible aspiration pneumonia and UTI. 11/23 CXR shows increased interstitial and airspace opacity in the left mid to lower lung, may represent infiltrate. WBC 10.7, currently afebrile. O2 sats 97% on 2LNC. Procal 0.2.  Plan: Zosyn 3.375g q8h  Monitor clinical picture F/U C&S, abx deescalation / LOT   Temp (24hrs), Avg:98.9 F (37.2 C), Min:97.9 F (36.6 C), Max:100.8 F (38.2 C)   Recent Labs Lab 02/15/2016 1319 02/02/2016 1353 02/22/16 0556  WBC 9.6  --  10.7*  CREATININE 1.95*  --   --   LATICACIDVEN  --  1.75  --     Estimated Creatinine Clearance: 31.8 mL/min (by C-G formula based on SCr of 1.95 mg/dL (H)).    No Known Allergies  Antimicrobials this admission: Ceftriaxone 11/23 >> 11/23 Azithromycin 11/23>>11/23 Zosyn 11/24>>  Dose adjustments this admission: n/a  Microbiology results: 11/23 BCx: sent 11/23 UCx: sent   Thank you for allowing pharmacy to be a part of this patient's care.  Alfredo BachJoseph Arminger, Cleotis NipperBS, PharmD Clinical Pharmacy Resident [phone] (Pager) 02/22/2016 9:40 AM

## 2016-02-22 NOTE — Progress Notes (Signed)
Dr. Ella JubileeArrien notified via text message of urine output for this shift so far.

## 2016-02-22 NOTE — Evaluation (Signed)
Clinical/Bedside Swallow Evaluation Patient Details  Name: Terrance Perez MRN: [id number] Date of Birth: 1933-12-05  Today's Date: 02/22/2016 Time: SLP Start Time (ACUTE ONLY): 1432 SLP Stop Time (ACUTE ONLY): 1520 SLP Time Calculation (min) (ACUTE ONLY): 48 min  Past Medical History:  Past Medical History:  Diagnosis Date  . Abnormality of gait 12/02/2012  . Coronary artery disease   . CVD (cerebrovascular disease)   . Diabetes mellitus   . Diastolic dysfunction   . Dysphagia, pharyngoesophageal phase 06/08/2014   Thickened fluids  . Dyspnea   . Fracture, humerus    right  . Glaucoma   . Gout   . History of orthostatic hypotension   . Hyperlipidemia   . Hypertension   . Obesity   . Paranoid schizophrenia (HCC)   . Parkinsonism (HCC) 12/02/2012  . PNA (pneumonia) May 2015  . RLS (restless legs syndrome) 02/04/2016  . Tremor    Past Surgical History:  Past Surgical History:  Procedure Laterality Date  . ASD REPAIR    . CARDIAC CATHETERIZATION  08/31/2006   MODERATE INFERIOR WALL HYPOKINESIA WITH EF 45%  . CHOLECYSTECTOMY    . CORONARY ARTERY BYPASS GRAFT  2008   LIMA GRAFT TO THE LAD, SAPHENOUS VEIN GRAFT TO THE DIAGONAL , SEQUENTIAL SAPHENOUS VEIN GRAFT TO THE PDA, POSTERIOR LATERAL , AND OBTUSE MARGINAL VESSELS.  . TONSILLECTOMY     HPI:  82 y.o.malewith medical history significant for CAD, CABG, DM, dysphagia, glaucoma, gout, HLD, HTN, schizophrenia, Parkinson's, RLS presenting with cough, confusion, weakness, and gross hematuria. Symptoms progressive x2 weeks. Dx acute encephalopathy, UTI, possible pna. 11/23 CXR shows increased interstitial and airspace opacity in the left mid to lower lung, may represent infiltrate. Pt spends most of day at home in a lift chair; [facility]  Terrance Perez cares for her husband at home.  She thickens his liquids (consistency unclear); feeds him his meals; he sleeps in lift chair and she sleeps  next to him in recliner in living room.     Assessment / Plan / Recommendation Clinical Impression  Pt presents with concerns for progressing dysphagia and increased aspiration risk.  He is alert, but requires mod cues to maintain arousal and maintain head support for PO intake.  Pt presents with several risk factors for aspiration, including wet phonation/throat-clearing after consumption of thin liquids; high RR (upper 30s), creating concern for adequate respiratory/swallowing reciprocity; and dependency on others for feeding.  For now, recommend changing diet to dysphagia 1, nectar-thick liquids; meds crushed in puree; MBS to follow.  Discussed at length with Terrance Perez.  Recommend OT/PT evaluations as well, given decreased function and increased burden of care upon Terrance Perez, who has minimal outside support.  SLP will follow.     Aspiration Risk  Moderate aspiration risk    Diet Recommendation     Medication Administration: Crushed with puree    Other  Recommendations Oral Care Recommendations: Oral care BID Other Recommendations: Order thickener from pharmacy   Follow up Recommendations Skilled Nursing facility      Frequency and Duration min 2x/week  2 weeks       Prognosis Prognosis for Safe Diet Advancement: Fair      Swallow Study   General HPI: 82 y.o.malewith medical history significant for CAD, CABG, DM, dysphagia, glaucoma, gout, HLD, HTN, schizophrenia, Parkinson's, RLS presenting with cough, confusion, weakness, and gross hematuria. Symptoms progressive x2 weeks. Dx acute encephalopathy, UTI, possible pna. 11/23 CXR shows  increased interstitial and airspace opacity in the left mid to lower lung, may represent infiltrate. Pt spends most of day at home in a lift chair; [facility]  Terrance Perez cares for her husband at home.  She thickens his liquids (consistency unclear); feeds him his meals; he sleeps in lift chair and she  sleeps next to him in recliner in living room.   Type of Study: Bedside Swallow Evaluation Previous Swallow Assessment: 2016 per neuro notes but records not available Diet Prior to this Study: Regular;Thin liquids Temperature Spikes Noted: No Respiratory Status: Nasal cannula History of Recent Intubation: No Behavior/Cognition: Alert Oral Cavity Assessment: Within Functional Limits Oral Care Completed by SLP: No Oral Cavity - Dentition: Dentures, top;Dentures, bottom Vision: Impaired for self-feeding Self-Feeding Abilities: Needs assist Patient Positioning: Partially reclined Baseline Vocal Quality: Low vocal intensity Volitional Cough: Weak Volitional Swallow: Able to elicit    Oral/Motor/Sensory Function Overall Oral Motor/Sensory Function: Generalized oral weakness   Ice Chips Ice chips: Not tested   Thin Liquid Thin Liquid: Impaired Presentation: Cup Pharyngeal  Phase Impairments: Multiple swallows;Throat Clearing - Immediate    Nectar Thick Nectar Thick Liquid: Impaired Presentation: Cup Pharyngeal Phase Impairments: Multiple swallows   Honey Thick Honey Thick Liquid: Not tested   Puree Puree: Within functional limits   Solid   GO   Solid: Not tested       Marchelle FolksAmanda L. Samson Fredericouture, [place]MA CCC/SLP Pager [phone]  Blenda MountsCouture, Doren Kaspar Laurice 02/22/2016,3:42 PM

## 2016-02-22 NOTE — Progress Notes (Signed)
Dr. Ella JubileeArrien text paged asking him to call me for a UO update.  Awaiting call back.

## 2016-02-22 NOTE — Progress Notes (Signed)
Unable to feed patient as Speech Therapy instructions are NOT to feed if respiratory rate is greater than 30 bpm.

## 2016-02-23 ENCOUNTER — Inpatient Hospital Stay (HOSPITAL_COMMUNITY): Payer: Medicare Other

## 2016-02-23 DIAGNOSIS — I5023 Acute on chronic systolic (congestive) heart failure: Secondary | ICD-10-CM

## 2016-02-23 DIAGNOSIS — I509 Heart failure, unspecified: Secondary | ICD-10-CM

## 2016-02-23 LAB — ECHOCARDIOGRAM COMPLETE
HEIGHTINCHES: 67 in
WEIGHTICAEL: 3146.41 [oz_av]

## 2016-02-23 LAB — GLUCOSE, CAPILLARY
GLUCOSE-CAPILLARY: 103 mg/dL — AB (ref 65–99)
Glucose-Capillary: 127 mg/dL — ABNORMAL HIGH (ref 65–99)
Glucose-Capillary: 143 mg/dL — ABNORMAL HIGH (ref 65–99)
Glucose-Capillary: 121 mg/dL — ABNORMAL HIGH (ref 65–99)

## 2016-02-23 LAB — BASIC METABOLIC PANEL
ANION GAP: 11 (ref 5–15)
BUN: 34 mg/dL — AB (ref 6–20)
CHLORIDE: 106 mmol/L (ref 101–111)
CO2: 21 mmol/L — AB (ref 22–32)
Calcium: 8.2 mg/dL — ABNORMAL LOW (ref 8.9–10.3)
Creatinine, Ser: 2.16 mg/dL — ABNORMAL HIGH (ref 0.61–1.24)
GFR calc Af Amer: 32 mL/min — ABNORMAL LOW (ref >60)
GFR calc non Af Amer: 27 mL/min — ABNORMAL LOW (ref >60)
GLUCOSE: 102 mg/dL — AB (ref 65–99)
POTASSIUM: 3.7 mmol/L (ref 3.5–5.1)
Sodium: 138 mmol/L (ref 135–145)

## 2016-02-23 LAB — CBC WITH DIFFERENTIAL/PLATELET
Basophils Absolute: 0 10*3/uL (ref 0.0–0.1)
Basophils Relative: 0 %
EOS PCT: 1 %
Eosinophils Absolute: 0.1 10*3/uL (ref 0.0–0.7)
HEMATOCRIT: 32 % — AB (ref 39.0–52.0)
HEMOGLOBIN: 10.1 g/dL — AB (ref 13.0–17.0)
LYMPHS ABS: 1.2 10*3/uL (ref 0.7–4.0)
LYMPHS PCT: 18 %
MCH: 30.1 pg (ref 26.0–34.0)
MCHC: 31.6 g/dL (ref 30.0–36.0)
MCV: 95.2 fL (ref 78.0–100.0)
Monocytes Absolute: 0.9 10*3/uL (ref 0.1–1.0)
Monocytes Relative: 14 %
NEUTROS ABS: 4.6 10*3/uL (ref 1.7–7.7)
NEUTROS PCT: 67 %
Platelets: 211 10*3/uL (ref 150–400)
RBC: 3.36 MIL/uL — AB (ref 4.22–5.81)
RDW: 15.5 % (ref 11.5–15.5)
WBC: 6.9 10*3/uL (ref 4.0–10.5)

## 2016-02-23 LAB — PROCALCITONIN: Procalcitonin: 0.84 ng/mL

## 2016-02-23 LAB — LEGIONELLA PNEUMOPHILA SEROGP 1 UR AG: L. PNEUMOPHILA SEROGP 1 UR AG: NEGATIVE

## 2016-02-23 MED ORDER — PERFLUTREN LIPID MICROSPHERE
1.0000 mL | INTRAVENOUS | Status: AC | PRN
  Administered 2016-02-23: 2 mL via INTRAVENOUS
  Filled 2016-02-23: qty 10

## 2016-02-23 MED ORDER — FUROSEMIDE 10 MG/ML IJ SOLN
60.0000 mg | Freq: Once | INTRAMUSCULAR | Status: AC
  Administered 2016-02-23: 60 mg via INTRAVENOUS
  Filled 2016-02-23: qty 6

## 2016-02-23 NOTE — Progress Notes (Signed)
Modified Barium Swallow Progress Note  Patient Details  Name: Terrance SabalStephen R Perez MRN: [id number] Date of Birth: June 07, 1933  Today's Date: 02/23/2016  Modified Barium Swallow completed.  Full report located under Chart Review in the Imaging Section.  Brief recommendations include the following:  Clinical Impression  Pt presents with a severe neuromuscular dysphagia likely due to history of Parkinson's.  Poor participation and generalized weakness worsened function. Pt could not sit upright in the MBS chair; would not bend at the waist and thus has a posterior lean requiring a towel to support pts head. He would not attempt a chin tuck despite max tactile cues and refused puree. He insisted on straw sips of liquids. Primary problems included lingual weakness and poor bolus formation with passive spillage of liquids to the pharynx. Base of tongue movement and hyolaryngeal mobility were severely weak causing moderate to severe residuals that pt could not clear despite cueing. Pt sensed penetrate of residual and effectively expelled this. Thin was attempted to reduce residue, but resulted in severe aspiration before the swallow. Would recommend pt continue current diet (dys 1 puree with nectar thick liquids) with high risk of aspiration and poor prognosis for improvement given participation.    Swallow Evaluation Recommendations       SLP Diet Recommendations: Dysphagia 1 (Puree) solids;Nectar thick liquid   Liquid Administration via: Straw   Medication Administration: Whole meds with puree   Supervision: Full assist for feeding   Compensations: Slow rate;Small sips/bites;Effortful swallow;Multiple dry swallows after each bite/sip;Follow solids with liquid       Oral Care Recommendations: Oral care BID       Harlon DittyBonnie Yonael Tulloch, MA CCC-SLP [phone]  [name], Riley NearingBonnie Caroline 02/23/2016,12:31 PM

## 2016-02-23 NOTE — Progress Notes (Signed)
  Echocardiogram 2D Echocardiogram with Definity has been performed.  Terrance Perez, Terrance Perez 02/23/2016, 9:20 AM

## 2016-02-23 NOTE — Progress Notes (Signed)
PROGRESS NOTE    Terrance SabalStephen R Lempke  WUJ:[id number] DOB: Apr 19, 1933 DOA: 2015-04-29 PCP: Minda MeoARONSON,RICHARD A, MD    Brief Narrative:  82 yo male with ischemic cardiomyopathy, t2dm, hypertension, schizophrenia and parkinson's, who presents with metabolic encephalopathy and hematuria. On admission confused, febrile and with tachypnea. Urine analysis with pyuria and chest film with left lower lobe infiltrate. Admitted to step down unit with the working diagnosis of sepsis. Positive e coli bacteremia, antibiotics changed to Unasyn, worsening renal failure.    Assessment & Plan:   Active Problems:   Hypertension   Hyperlipidemia   Parkinsonism (HCC)   Encephalopathy acute   Acute encephalopathy   Systolic congestive heart failure (HCC)   AKI (acute kidney injury) (HCC)   Schizophrenia (HCC)   SIRS (systemic inflammatory response syndrome) (HCC)   1. Sepsis due to urine infection E coli (present on admission). Will continue antibiotic to cover pneumonia and urine infection with unasyn. Gram negative bacteremia, follow blood cultures no growth. Wbc at 6.9 from 10.7. Mentation stable. Noted low urine output. Keep MAP at 65.   2. Left lower lobe pneumonia (present on admission).  Follow chest film personally reviewed noted persistent infiltrates on the left lower lobe. Positive vascular congestion, continue antibiotic therapy with unasyn to cover anaerobic organism, will continue oxymetry monitoring and supplemental 02 per [place] to keep 02 sat above 92%. Currently on 2 LPM with saturation 98 %. Swallow evaluation with recommendations for pureed diet with aspiration precautions.   3. AKI on ckd stage 3. Renal function with cr up to 2.16 from 1.95, urine output 775 over last 24 hours. Will check on urinary electrolytes, if urine output has no improvement with fluids, will attempt diuresis with furosemide 60 mg x1. Serum bicarbonate at 21. Keep map more than 65, avoid nephrotoxic medications.   4.  T2DM. Capillary glucose 126-127-103. Continue glucose cover and monitoring with sliding scale.   5. Systolic heart failure, chronic. Follow up echocardiogram with worsening LV systolic dysfunction, from 30 to 20% with diffuse hypokinesis, anteroseptal, anterior and apical. Blood pressure with MAP more than 65. Hold on ace inh due to risk for hypotension.     6. HTN. Will continue to hold on coreg, furosemide and losartan. Avoid hypotension.   7. Schizophrenia and parkinson. Patient not agitated, continue to be able to respond to questions, continue sertraline, seroquel, carbidopa-levodopa and cogentin.    DVT prophylaxis: heparin.  Code Status: full  Family Communication: No family at the bedside Disposition Plan: Home or snf   Consultants:     Procedures:     Antimicrobials:   Unasyn #1    Subjective: Patient had foley catheter inserted due to urinary retention, diet changed to pureed. Dyspnea has improved, no chest pain, no nausea or vomiting.   Objective: Vitals:   02/22/16 2100 02/23/16 0050 02/23/16 0330 02/23/16 0700  BP:  118/64 116/66 120/89  Pulse:  75 73 76  Resp:  14 (!) 23 (!) 21  Temp:  97.8 F (36.6 C) 97.9 F (36.6 C) 98.4 F (36.9 C)  TempSrc:  Oral Oral Oral  SpO2:  95% 100% 98%  Weight: 91.2 kg (201 lb 1 oz)  89.2 kg (196 lb 10.4 oz)   Height:        Intake/Output Summary (Last 24 hours) at 02/23/16 0746 Last data filed at 02/23/16 0600  Gross per 24 hour  Intake              240 ml  Output  775 ml  Net             -535 ml   Filed Weights   02/01/2016 1424 02/22/16 2100 02/23/16 0330  Weight: 83.9 kg (185 lb) 91.2 kg (201 lb 1 oz) 89.2 kg (196 lb 10.4 oz)    Examination:  General exam: deconditioned, not in pain or dyspnea E ENT: mild pallor but no icterus. Oral mucosa dry.  Respiratory system: No wheezing or rhonchi. Positive rales bilateral at bases more left than right. Respiratory effort normal. Cardiovascular system:  S1 & S2 heard, RRR. No JVD, murmurs, rubs, gallops or clicks. No pedal edema. Gastrointestinal system: Abdomen with mild distention, soft and nontender. No organomegaly or masses felt. Normal bowel sounds heard. Central nervous system: Somnolent but oriented. No focal neurological deficits. Able to answer questions.  Extremities: Symmetric 5 x 5 power. Skin: No rashes, lesions or ulcers   Data Reviewed: I have personally reviewed following labs and imaging studies  CBC:  Recent Labs Lab 02/19/2016 1319 02/22/16 0556 02/23/16 0531  WBC 9.6 10.7* 6.9  NEUTROABS 7.5  --  4.6  HGB 10.1* 10.6* 10.1*  HCT 30.9* 33.1* 32.0*  MCV 93.6 95.9 95.2  PLT 207 204 211   Basic Metabolic Panel:  Recent Labs Lab 02/11/2016 1319 02/23/16 0531  NA 132* 138  K 3.6 3.7  CL 101 106  CO2 22 21*  GLUCOSE 191* 102*  BUN 27* 34*  CREATININE 1.95* 2.16*  CALCIUM 8.1* 8.2*   GFR: Estimated Creatinine Clearance: 29.5 mL/min (by C-G formula based on SCr of 2.16 mg/dL (H)). Liver Function Tests:  Recent Labs Lab 02/08/2016 1319  AST 77*  ALT 11*  ALKPHOS 75  BILITOT 1.6*  PROT 6.2*  ALBUMIN 2.8*   No results for input(s): LIPASE, AMYLASE in the last 168 hours. No results for input(s): AMMONIA in the last 168 hours. Coagulation Profile: No results for input(s): INR, PROTIME in the last 168 hours. Cardiac Enzymes: No results for input(s): CKTOTAL, CKMB, CKMBINDEX, TROPONINI in the last 168 hours. BNP (last 3 results) No results for input(s): PROBNP in the last 8760 hours. HbA1C: No results for input(s): HGBA1C in the last 72 hours. CBG:  Recent Labs Lab 02/22/16 0847 02/22/16 1151 02/22/16 1628 02/22/16 2112 02/23/16 0738  GLUCAP 86 83 112* 126* 127*   Lipid Profile: No results for input(s): CHOL, HDL, LDLCALC, TRIG, CHOLHDL, LDLDIRECT in the last 72 hours. Thyroid Function Tests: No results for input(s): TSH, T4TOTAL, FREET4, T3FREE, THYROIDAB in the last 72 hours. Anemia  Panel: No results for input(s): VITAMINB12, FOLATE, FERRITIN, TIBC, IRON, RETICCTPCT in the last 72 hours. Sepsis Labs:  Recent Labs Lab 02/23/2016 1353 02/02/2016 1655  PROCALCITON  --  0.28  LATICACIDVEN 1.75  --     Recent Results (from the past 240 hour(s))  Blood Culture (routine x 2)     Status: None (Preliminary result)   Collection Time: 02/17/2016  1:19 PM  Result Value Ref Range Status   Specimen Description BLOOD RIGHT FOREARM  Final   Special Requests BOTTLES DRAWN AEROBIC AND ANAEROBIC  5CC  Final   Culture  Setup Time   Final    GRAM NEGATIVE RODS AEROBIC BOTTLE ONLY CRITICAL RESULT CALLED TO, READ BACK BY AND VERIFIED WITH: T STONE,PHARMD AT 1016 02/22/16 BY L BENFIELD    Culture GRAM NEGATIVE RODS  Final   Report Status PENDING  Incomplete  Blood Culture ID Panel (Reflexed)     Status: Abnormal  Collection Time: Mar 04, 2016  1:19 PM  Result Value Ref Range Status   Enterococcus species NOT DETECTED NOT DETECTED Final   Listeria monocytogenes NOT DETECTED NOT DETECTED Final   Staphylococcus species NOT DETECTED NOT DETECTED Final   Staphylococcus aureus NOT DETECTED NOT DETECTED Final   Streptococcus species NOT DETECTED NOT DETECTED Final   Streptococcus agalactiae NOT DETECTED NOT DETECTED Final   Streptococcus pneumoniae NOT DETECTED NOT DETECTED Final   Streptococcus pyogenes NOT DETECTED NOT DETECTED Final   Acinetobacter baumannii NOT DETECTED NOT DETECTED Final   Enterobacteriaceae species DETECTED (A) NOT DETECTED Final    Comment: CRITICAL RESULT CALLED TO, READ BACK BY AND VERIFIED WITH: T STONE,PHARMD AT 1016 02/22/16 BY L BENFIELD    Enterobacter cloacae complex NOT DETECTED NOT DETECTED Final   Escherichia coli DETECTED (A) NOT DETECTED Final    Comment: CRITICAL RESULT CALLED TO, READ BACK BY AND VERIFIED WITH: T STONE,PHARMD AT 1016 02/22/16 BY L BENFIELD    Klebsiella oxytoca NOT DETECTED NOT DETECTED Final   Klebsiella pneumoniae NOT DETECTED NOT  DETECTED Final   Proteus species NOT DETECTED NOT DETECTED Final   Serratia marcescens NOT DETECTED NOT DETECTED Final   Carbapenem resistance NOT DETECTED NOT DETECTED Final   Haemophilus influenzae NOT DETECTED NOT DETECTED Final   Neisseria meningitidis NOT DETECTED NOT DETECTED Final   Pseudomonas aeruginosa NOT DETECTED NOT DETECTED Final   Candida albicans NOT DETECTED NOT DETECTED Final   Candida glabrata NOT DETECTED NOT DETECTED Final   Candida krusei NOT DETECTED NOT DETECTED Final   Candida parapsilosis NOT DETECTED NOT DETECTED Final   Candida tropicalis NOT DETECTED NOT DETECTED Final  Blood Culture (routine x 2)     Status: None (Preliminary result)   Collection Time: March 04, 2016  1:56 PM  Result Value Ref Range Status   Specimen Description BLOOD LEFT FOREARM  Final   Special Requests BOTTLES DRAWN AEROBIC AND ANAEROBIC  5CC  Final   Culture NO GROWTH 1 DAY  Final   Report Status PENDING  Incomplete  Urine culture     Status: Abnormal (Preliminary result)   Collection Time: 03-04-16  2:02 PM  Result Value Ref Range Status   Specimen Description URINE, RANDOM  Final   Special Requests NONE  Final   Culture >=100,000 COLONIES/mL ESCHERICHIA COLI (A)  Final   Report Status PENDING  Incomplete  MRSA PCR Screening     Status: None   Collection Time: 2016-03-04 10:30 PM  Result Value Ref Range Status   MRSA by PCR NEGATIVE NEGATIVE Final    Comment:        The GeneXpert MRSA Assay (FDA approved for NASAL specimens only), is one component of a comprehensive MRSA colonization surveillance program. It is not intended to diagnose MRSA infection nor to guide or monitor treatment for MRSA infections.          Radiology Studies: Dg Chest Portable 1 View  Result Date: 2016-03-04 CLINICAL DATA:  Fever with altered mental status EXAM: PORTABLE CHEST 1 VIEW COMPARISON:  07/18/2014 FINDINGS: AP portable semi-erect view of the chest demonstrates median sternotomy wires.  Markedly low lung volumes. There is stable cardiomegaly with atherosclerosis. Mild central vascular congestion. Interval increase in interstitial and airspace opacity in the left lower lung zone which may represent infiltrate. No pneumothorax. IMPRESSION: 1. Low lung volumes. 2. Stable mild cardiomegaly with mild central vascular congestion. 3. Increased interstitial and airspace opacity in the left mid to lower lung zone, may represent superimposed  infiltrate. Electronically Signed   By: Jasmine Pang M.D.   On: 2016/03/13 14:30        Scheduled Meds: . allopurinol  300 mg Oral QPM  . ampicillin-sulbactam (UNASYN) IV  3 g Intravenous Q12H  . benztropine  0.5 mg Oral BID  . brimonidine  1 drop Both Eyes TID  . Carbidopa-Levodopa ER  1 tablet Oral Q1500  . Carbidopa-Levodopa ER  1.5 tablet Oral BID  . clopidogrel  75 mg Oral Daily  . dorzolamide  1 drop Both Eyes BID   And  . timolol  1 drop Both Eyes BID  . heparin  5,000 Units Subcutaneous Q8H  . insulin aspart  0-5 Units Subcutaneous QHS  . insulin aspart  0-9 Units Subcutaneous TID WC  . latanoprost  1 drop Both Eyes QHS  . pantoprazole  40 mg Oral Daily  . QUEtiapine  25 mg Oral BID WC  . QUEtiapine  50 mg Oral QHS  . sertraline  25 mg Oral QHS   Continuous Infusions: . sodium chloride 1,000 mL (02/22/16 2326)     LOS: 2 days       Mauricio Annett Gula, MD [facility] Pager [phone]  If 7PM-7AM, please contact night-coverage [email or web address] Password [facility] 02/23/2016, 7:46 AM

## 2016-02-24 ENCOUNTER — Inpatient Hospital Stay (HOSPITAL_COMMUNITY): Payer: Medicare Other

## 2016-02-24 DIAGNOSIS — A419 Sepsis, unspecified organism: Secondary | ICD-10-CM

## 2016-02-24 DIAGNOSIS — J690 Pneumonitis due to inhalation of food and vomit: Secondary | ICD-10-CM

## 2016-02-24 DIAGNOSIS — R7881 Bacteremia: Secondary | ICD-10-CM

## 2016-02-24 LAB — BLOOD GAS, ARTERIAL
Acid-base deficit: 3.7 mmol/L — ABNORMAL HIGH (ref 0.0–2.0)
Bicarbonate: 20.7 mmol/L (ref 20.0–28.0)
Drawn by: 331761
O2 Content: 2 L/min
O2 Saturation: 95.4 %
PATIENT TEMPERATURE: 98.7
pCO2 arterial: 37.4 mmHg (ref 32.0–48.0)
pH, Arterial: 7.363 (ref 7.350–7.450)
pO2, Arterial: 83.8 mmHg (ref 83.0–108.0)

## 2016-02-24 LAB — CBC WITH DIFFERENTIAL/PLATELET
BASOS ABS: 0 10*3/uL (ref 0.0–0.1)
BASOS PCT: 0 %
EOS ABS: 0.4 10*3/uL (ref 0.0–0.7)
EOS PCT: 5 %
HCT: 32.7 % — ABNORMAL LOW (ref 39.0–52.0)
Hemoglobin: 10.4 g/dL — ABNORMAL LOW (ref 13.0–17.0)
Lymphocytes Relative: 23 %
Lymphs Abs: 1.8 10*3/uL (ref 0.7–4.0)
MCH: 30.4 pg (ref 26.0–34.0)
MCHC: 31.8 g/dL (ref 30.0–36.0)
MCV: 95.6 fL (ref 78.0–100.0)
Monocytes Absolute: 0.9 10*3/uL (ref 0.1–1.0)
Monocytes Relative: 12 %
Neutro Abs: 4.7 10*3/uL (ref 1.7–7.7)
Neutrophils Relative %: 60 %
PLATELETS: 210 10*3/uL (ref 150–400)
RBC: 3.42 MIL/uL — AB (ref 4.22–5.81)
RDW: 15.8 % — ABNORMAL HIGH (ref 11.5–15.5)
WBC: 7.9 10*3/uL (ref 4.0–10.5)

## 2016-02-24 LAB — BASIC METABOLIC PANEL
ANION GAP: 11 (ref 5–15)
BUN: 34 mg/dL — AB (ref 6–20)
CO2: 20 mmol/L — ABNORMAL LOW (ref 22–32)
Calcium: 8.1 mg/dL — ABNORMAL LOW (ref 8.9–10.3)
Chloride: 109 mmol/L (ref 101–111)
Creatinine, Ser: 1.92 mg/dL — ABNORMAL HIGH (ref 0.61–1.24)
GFR, EST AFRICAN AMERICAN: 37 mL/min — AB (ref >60)
GFR, EST NON AFRICAN AMERICAN: 32 mL/min — AB (ref >60)
Glucose, Bld: 122 mg/dL — ABNORMAL HIGH (ref 65–99)
POTASSIUM: 3.1 mmol/L — AB (ref 3.5–5.1)
SODIUM: 140 mmol/L (ref 135–145)

## 2016-02-24 LAB — GLUCOSE, CAPILLARY
GLUCOSE-CAPILLARY: 116 mg/dL — AB (ref 65–99)
GLUCOSE-CAPILLARY: 91 mg/dL (ref 65–99)
GLUCOSE-CAPILLARY: 128 mg/dL — AB (ref 65–99)
Glucose-Capillary: 167 mg/dL — ABNORMAL HIGH (ref 65–99)

## 2016-02-24 MED ORDER — SODIUM CHLORIDE 0.9 % IV SOLN
30.0000 meq | Freq: Once | INTRAVENOUS | Status: AC
  Administered 2016-02-24: 30 meq via INTRAVENOUS
  Filled 2016-02-24: qty 15

## 2016-02-24 MED ORDER — PIPERACILLIN-TAZOBACTAM 3.375 G IVPB
3.3750 g | Freq: Three times a day (TID) | INTRAVENOUS | Status: DC
  Administered 2016-02-24 – 2016-02-27 (×9): 3.375 g via INTRAVENOUS
  Filled 2016-02-24 (×11): qty 50

## 2016-02-24 MED ORDER — FUROSEMIDE 10 MG/ML IJ SOLN
60.0000 mg | Freq: Once | INTRAMUSCULAR | Status: AC
  Administered 2016-02-24: 60 mg via INTRAVENOUS
  Filled 2016-02-24: qty 6

## 2016-02-24 NOTE — Progress Notes (Signed)
PROGRESS NOTE    Terrance Perez  WUJ:[id number] DOB: 02-20-1934 DOA: 03/09/2016 PCP: Minda Meo, MD    Brief Narrative:  82 yo male with ischemic cardiomyopathy, t2dm, hypertension, schizophrenia and parkinson's, who presents with metabolic encephalopathy and hematuria. On admission confused, febrile and with tachypnea. Urine analysis with pyuria and chest film with left lower lobe infiltrate. Admitted to step down unit with the working diagnosis of sepsis. Positive e coli bacteremia, antibiotics changed to Unasyn, worsening renal failure. Responding to diuresis, noted worsening LV function per echocardiography from 30 to 20%. Persistent tachypnea.    Assessment & Plan:   Active Problems:   Hypertension   Hyperlipidemia   Parkinsonism (HCC)   Encephalopathy acute   Acute encephalopathy   Systolic congestive heart failure (HCC)   AKI (acute kidney injury) (HCC)   Schizophrenia (HCC)   SIRS (systemic inflammatory response syndrome) (HCC)   1. Sepsis due to urine infection E coli (present on admission).  Blood culture with e coli resistant to unasyn, will change to zosyn following sensitivities. Clinically still in heart failure, will continue diuresis with furosemide 60 mg IV x1. MAP continue to be more than 65.  Follow on repeat cultures, cell count at 7.9.   2. Left lower lobe pneumonia (present on admission).  Continue oxymetry monitoring and supplemental 02 per [place] to keep 02 sat above 92%. Currently on 2 LPM with saturation 96 %. Continue aspiration precautions. Antibiotics will be changed to Zosyn.   3. AKI on ckd stage 3. Patient tolerating well diuresis, will continue furosemide 60 mg IV, urine output 1050 cc over last 24 hours, will continue to follow on renal function and electrolytes, keep map 65, avoid nephrotoxic medications. K at 3.1, will replete with 40 kcl IV.  Cl at 109 and serum bicarb at 20.   4. T2DM. Capillary glucose 121-116-91. Continue glucose  cover and monitoring with sliding scale.   5. Systolic heart failure, chronic. LV with significant reduction on systolic function, with diffuse hypokinesis, anteroseptal, anterior and apical. Will continue to hold on ace inh, b blockade. Will continue diuresis for now.     6. HTN.  Will continue supportive care, continue to hold antihypertensive agents.   7.Schizophrenia and parkinson. Patient somnolent and less reactive, will decrease sertraline to use only at night. Will continue aspiration precautions and neuro checks per unit protocol. Continue on carbidopa and levodopa.   8. Metabolic encephalopathy. Arterial blood gas with pH7.36 with pc02 37. Will continue supportive care and neuro checks, patient able to follow commands. At high risk for delirium.    DVT prophylaxis: heparin.  Code Status: full  Family Communication: No family at the bedside Disposition Plan: Home or snf   Consultants:     Procedures:     Antimicrobials:   Unasyn #2   Subjective: Patient with poor oral intake, patient noted with tachypnea overnight. Had furosemide yesterday with improvement in urine output.   Objective: Vitals:   02/23/16 1527 02/23/16 2007 02/24/16 0015 02/24/16 0404  BP: (!) 119/59 118/74 117/70 129/69  Pulse: 80 74 80 89  Resp: (!) 29 13 (!) 33 (!) 36  Temp: 98.3 F (36.8 C) 98.3 F (36.8 C) 98.9 F (37.2 C) 99 F (37.2 C)  TempSrc: Oral Oral Oral Oral  SpO2: 96% 100% 99% 92%  Weight:    92.2 kg (203 lb 4.2 oz)  Height:        Intake/Output Summary (Last 24 hours) at 02/24/16 9562 Last data filed at  02/24/16 0500  Gross per 24 hour  Intake              200 ml  Output             1050 ml  Net             -850 ml   Filed Weights   02/22/16 2100 02/23/16 0330 02/24/16 0404  Weight: 91.2 kg (201 lb 1 oz) 89.2 kg (196 lb 10.4 oz) 92.2 kg (203 lb 4.2 oz)    Examination:  General exam: deconditioned, tachypnic. E ENT: positive pallor but no icterus, oral  mucosa moist Respiratory system: decreased breath at bases with bibasilar rales, no wheezing or rhonchi.  Cardiovascular system: S1 & S2 heard, RRR. moderate JVD, murmurs, rubs, gallops or clicks. ++ non pitting edema. Gastrointestinal system: Abdomen is mild distended, soft and nontender. No organomegaly or masses felt. Normal bowel sounds heard. Central nervous system: Somnolent.  No focal neurological deficits. Able to follow commands. Extremities: Symmetric 5 x 5 power. Skin: No rashes, lesions or ulcers.    Data Reviewed: I have personally reviewed following labs and imaging studies  CBC:  Recent Labs Lab 03/19/2016 1319 02/22/16 0556 02/23/16 0531 02/24/16 0423  WBC 9.6 10.7* 6.9 7.9  NEUTROABS 7.5  --  4.6 4.7  HGB 10.1* 10.6* 10.1* 10.4*  HCT 30.9* 33.1* 32.0* 32.7*  MCV 93.6 95.9 95.2 95.6  PLT 207 204 211 210   Basic Metabolic Panel:  Recent Labs Lab March 19, 2016 1319 02/23/16 0531 02/24/16 0423  NA 132* 138 140  K 3.6 3.7 3.1*  CL 101 106 109  CO2 22 21* 20*  GLUCOSE 191* 102* 122*  BUN 27* 34* 34*  CREATININE 1.95* 2.16* 1.92*  CALCIUM 8.1* 8.2* 8.1*   GFR: Estimated Creatinine Clearance: 33.8 mL/min (by C-G formula based on SCr of 1.92 mg/dL (H)). Liver Function Tests:  Recent Labs Lab March 19, 2016 1319  AST 77*  ALT 11*  ALKPHOS 75  BILITOT 1.6*  PROT 6.2*  ALBUMIN 2.8*   No results for input(s): LIPASE, AMYLASE in the last 168 hours. No results for input(s): AMMONIA in the last 168 hours. Coagulation Profile: No results for input(s): INR, PROTIME in the last 168 hours. Cardiac Enzymes: No results for input(s): CKTOTAL, CKMB, CKMBINDEX, TROPONINI in the last 168 hours. BNP (last 3 results) No results for input(s): PROBNP in the last 8760 hours. HbA1C: No results for input(s): HGBA1C in the last 72 hours. CBG:  Recent Labs Lab 02/23/16 0738 02/23/16 1237 02/23/16 1731 02/23/16 2133 02/24/16 0739  GLUCAP 127* 103* 143* 121* 116*   Lipid  Profile: No results for input(s): CHOL, HDL, LDLCALC, TRIG, CHOLHDL, LDLDIRECT in the last 72 hours. Thyroid Function Tests: No results for input(s): TSH, T4TOTAL, FREET4, T3FREE, THYROIDAB in the last 72 hours. Anemia Panel: No results for input(s): VITAMINB12, FOLATE, FERRITIN, TIBC, IRON, RETICCTPCT in the last 72 hours. Sepsis Labs:  Recent Labs Lab 2016-03-19 1353 March 19, 2016 1655 02/23/16 0531  PROCALCITON  --  0.28 0.84  LATICACIDVEN 1.75  --   --     Recent Results (from the past 240 hour(s))  Blood Culture (routine x 2)     Status: Abnormal (Preliminary result)   Collection Time: 03/19/2016  1:19 PM  Result Value Ref Range Status   Specimen Description BLOOD RIGHT FOREARM  Final   Special Requests BOTTLES DRAWN AEROBIC AND ANAEROBIC  5CC  Final   Culture  Setup Time   Final  GRAM NEGATIVE RODS IN BOTH AEROBIC AND ANAEROBIC BOTTLES CRITICAL RESULT CALLED TO, READ BACK BY AND VERIFIED WITH: T STONE,PHARMD AT 1016 02/22/16 BY L BENFIELD    Culture ESCHERICHIA COLI (A)  Final   Report Status PENDING  Incomplete  Blood Culture ID Panel (Reflexed)     Status: Abnormal   Collection Time: 02/11/2016  1:19 PM  Result Value Ref Range Status   Enterococcus species NOT DETECTED NOT DETECTED Final   Listeria monocytogenes NOT DETECTED NOT DETECTED Final   Staphylococcus species NOT DETECTED NOT DETECTED Final   Staphylococcus aureus NOT DETECTED NOT DETECTED Final   Streptococcus species NOT DETECTED NOT DETECTED Final   Streptococcus agalactiae NOT DETECTED NOT DETECTED Final   Streptococcus pneumoniae NOT DETECTED NOT DETECTED Final   Streptococcus pyogenes NOT DETECTED NOT DETECTED Final   Acinetobacter baumannii NOT DETECTED NOT DETECTED Final   Enterobacteriaceae species DETECTED (A) NOT DETECTED Final    Comment: CRITICAL RESULT CALLED TO, READ BACK BY AND VERIFIED WITH: T STONE,PHARMD AT 1016 02/22/16 BY L BENFIELD    Enterobacter cloacae complex NOT DETECTED NOT DETECTED  Final   Escherichia coli DETECTED (A) NOT DETECTED Final    Comment: CRITICAL RESULT CALLED TO, READ BACK BY AND VERIFIED WITH: T STONE,PHARMD AT 1016 02/22/16 BY L BENFIELD    Klebsiella oxytoca NOT DETECTED NOT DETECTED Final   Klebsiella pneumoniae NOT DETECTED NOT DETECTED Final   Proteus species NOT DETECTED NOT DETECTED Final   Serratia marcescens NOT DETECTED NOT DETECTED Final   Carbapenem resistance NOT DETECTED NOT DETECTED Final   Haemophilus influenzae NOT DETECTED NOT DETECTED Final   Neisseria meningitidis NOT DETECTED NOT DETECTED Final   Pseudomonas aeruginosa NOT DETECTED NOT DETECTED Final   Candida albicans NOT DETECTED NOT DETECTED Final   Candida glabrata NOT DETECTED NOT DETECTED Final   Candida krusei NOT DETECTED NOT DETECTED Final   Candida parapsilosis NOT DETECTED NOT DETECTED Final   Candida tropicalis NOT DETECTED NOT DETECTED Final  Blood Culture (routine x 2)     Status: None (Preliminary result)   Collection Time: 02/20/2016  1:56 PM  Result Value Ref Range Status   Specimen Description BLOOD LEFT FOREARM  Final   Special Requests BOTTLES DRAWN AEROBIC AND ANAEROBIC  5CC  Final   Culture NO GROWTH 2 DAYS  Final   Report Status PENDING  Incomplete  Urine culture     Status: Abnormal (Preliminary result)   Collection Time: 02/12/2016  2:02 PM  Result Value Ref Range Status   Specimen Description URINE, RANDOM  Final   Special Requests NONE  Final   Culture (A)  Final    >=100,000 COLONIES/mL ESCHERICHIA COLI CULTURE REINCUBATED FOR BETTER GROWTH    Report Status PENDING  Incomplete  MRSA PCR Screening     Status: None   Collection Time: 02/28/2016 10:30 PM  Result Value Ref Range Status   MRSA by PCR NEGATIVE NEGATIVE Final    Comment:        The GeneXpert MRSA Assay (FDA approved for NASAL specimens only), is one component of a comprehensive MRSA colonization surveillance program. It is not intended to diagnose MRSA infection nor to guide  or monitor treatment for MRSA infections.   Culture, blood (routine x 2)     Status: None (Preliminary result)   Collection Time: 02/22/16  4:47 PM  Result Value Ref Range Status   Specimen Description BLOOD LEFT ANTECUBITAL  Final   Special Requests BOTTLES DRAWN AEROBIC AND  ANAEROBIC 5CC EA  Final   Culture NO GROWTH < 24 HOURS  Final   Report Status PENDING  Incomplete  Culture, blood (routine x 2)     Status: None (Preliminary result)   Collection Time: 02/22/16  4:53 PM  Result Value Ref Range Status   Specimen Description BLOOD RIGHT HAND  Final   Special Requests BOTTLES DRAWN AEROBIC AND ANAEROBIC 10CC EA  Final   Culture NO GROWTH < 24 HOURS  Final   Report Status PENDING  Incomplete         Radiology Studies: Dg Chest Port 1 View  Result Date: 02/23/2016 CLINICAL DATA:  Dyspnea EXAM: PORTABLE CHEST 1 VIEW COMPARISON:  02/17/2016 FINDINGS: Cardiac shadow is mildly enlarged. Aortic calcifications are again seen. 4 inspiratory effort is noted with persistent vascular congestion. No bony abnormality is seen. IMPRESSION: Persistent vascular congestion and poor inspiratory effort. Electronically Signed   By: Alcide Clever M.D.   On: 02/23/2016 11:11   Dg Swallowing Func-speech Pathology  Result Date: 02/23/2016 Objective Swallowing Evaluation: Type of Study: MBS-Modified Barium Swallow Study Patient Details Name: Terrance Perez MRN: [id number] Date of Birth: [birth year] Today's Date: 02/23/2016 Time: SLP Start Time (ACUTE ONLY): 1109-SLP Stop Time (ACUTE ONLY): 1130 SLP Time Calculation (min) (ACUTE ONLY): 21 min Past Medical History: Past Medical History: Diagnosis Date . Abnormality of gait 12/02/2012 . Coronary artery disease  . CVD (cerebrovascular disease)  . Diabetes mellitus  . Diastolic dysfunction  . Dysphagia, pharyngoesophageal phase 06/08/2014  Thickened fluids . Dyspnea  . Fracture, humerus   right . Glaucoma  . Gout  . History of orthostatic hypotension  .  Hyperlipidemia  . Hypertension  . Obesity  . Paranoid schizophrenia (HCC)  . Parkinsonism (HCC) 12/02/2012 . PNA (pneumonia) May 2015 . RLS (restless legs syndrome) 02/04/2016 . Tremor  Past Surgical History: Past Surgical History: Procedure Laterality Date . ASD REPAIR   . CARDIAC CATHETERIZATION  08/31/2006  MODERATE INFERIOR WALL HYPOKINESIA WITH EF 45% . CHOLECYSTECTOMY   . CORONARY ARTERY BYPASS GRAFT  2008  LIMA GRAFT TO THE LAD, SAPHENOUS VEIN GRAFT TO THE DIAGONAL , SEQUENTIAL SAPHENOUS VEIN GRAFT TO THE PDA, POSTERIOR LATERAL , AND OBTUSE MARGINAL VESSELS. . TONSILLECTOMY   HPI: 82 y.o.malewith medical history significant for CAD, CABG, DM, dysphagia, glaucoma, gout, HLD, HTN, schizophrenia, Parkinson's, RLS presenting with cough, confusion, weakness, and gross hematuria. Symptoms progressive x2 weeks. Dx acute encephalopathy, UTI, possible pna. 11/23 CXR shows increased interstitial and airspace opacity in the left mid to lower lung, may represent infiltrate. Pt spends most of day at home in a lift chair; dysphagia since 05/2014 per [facility] notes.  Mrs. Alsop cares for her husband at home.  She thickens his liquids (consistency unclear); feeds him his meals; he sleeps in lift chair and she sleeps next to him in recliner in living room.   Subjective: alert, but sleepy Assessment / Plan / Recommendation CHL IP CLINICAL IMPRESSIONS 02/23/2016 Therapy Diagnosis Moderate oral phase dysphagia;Severe pharyngeal phase dysphagia Clinical Impression  Pt presents with a severe neuromuscular dysphagia likely due to history of Parkinson's.  Poor participation and generalized weakness worsened function. Pt could not sit upright in the MBS chair; would not bend at the waist and thus has a posterior lean requiring a towel to support pts head. He would not attempt a chin tuck despite max tactile cues and refused puree. He insisted on straw sips of liquids. Primary problems included lingual weakness and poor  bolus  formation with passive spillage of liquids to the pharynx. Base of tongue movement and hyolaryngeal mobility were severely weak causing moderate to severe residuals that pt could not clear despite cueing. Pt sensed penetrate of residual and effectively expelled this. Thin was attempted to reduce residue, but resulted in severe aspiration before the swallow. Would recommend pt continue current diet (dys 1 puree with nectar thick liquids) with high risk of aspiration and poor prognosis for improvement given participation.  Impact on safety and function Severe aspiration risk   CHL IP TREATMENT RECOMMENDATION 02/23/2016 Treatment Recommendations Therapy as outlined in treatment plan below   Prognosis 02/23/2016 Prognosis for Safe Diet Advancement Guarded Barriers to Reach Goals Motivation Barriers/Prognosis Comment -- CHL IP DIET RECOMMENDATION 02/23/2016 SLP Diet Recommendations Dysphagia 1 (Puree) solids;Nectar thick liquid Liquid Administration via Straw Medication Administration Whole meds with puree Compensations Slow rate;Small sips/bites;Effortful swallow;Multiple dry swallows after each bite/sip;Follow solids with liquid Postural Changes --   CHL IP OTHER RECOMMENDATIONS 02/23/2016 Recommended Consults -- Oral Care Recommendations Oral care BID Other Recommendations --   CHL IP FOLLOW UP RECOMMENDATIONS 02/23/2016 Follow [facility] D[facility]/2016 Speech Therapy Frequency (ACUTE ONLY) min 2x/week Treatment Duration 2 weeks      CHL IP ORAL PHASE 02/23/2016 Oral Phase Impaired Oral - Pudding Teaspoon -- Oral - Pudding Cup -- Oral - Honey Teaspoon -- Oral - Honey Cup -- Oral - Nectar Teaspoon -- Oral - Nectar Cup Weak lingual manipulation;Reduced posterior propulsion;Lingual/palatal residue;Left pocketing in lateral sulci;Right pocketing in lateral sulci;Delayed oral transit;Decreased bolus cohesion;Premature spillage Oral - Nectar Straw Weak  lingual manipulation;Reduced posterior propulsion;Lingual/palatal residue;Left pocketing in lateral sulci;Right pocketing in lateral sulci;Delayed oral transit;Decreased bolus cohesion;Premature spillage Oral - Thin Teaspoon -- Oral - Thin Cup -- Oral - Thin Straw Weak lingual manipulation;Reduced posterior propulsion;Lingual/palatal residue;Left pocketing in lateral sulci;Right pocketing in lateral sulci;Delayed oral transit;Decreased bolus cohesion;Premature spillage Oral - Puree Weak lingual manipulation;Reduced posterior propulsion;Lingual/palatal residue;Left pocketing in lateral sulci;Right pocketing in lateral sulci;Delayed oral transit;Decreased bolus cohesion;Premature spillage Oral - Mech Soft -- Oral - Regular -- Oral - Multi-Consistency -- Oral - Pill -- Oral Phase - Comment --  CHL IP PHARYNGEAL PHASE 02/23/2016 Pharyngeal Phase Impaired Pharyngeal- Pudding Teaspoon -- Pharyngeal -- Pharyngeal- Pudding Cup -- Pharyngeal -- Pharyngeal- Honey Teaspoon -- Pharyngeal -- Pharyngeal- Honey Cup -- Pharyngeal -- Pharyngeal- Nectar Teaspoon -- Pharyngeal -- Pharyngeal- Nectar Cup Delayed swallow initiation-pyriform sinuses;Reduced pharyngeal peristalsis;Reduced epiglottic inversion;Reduced laryngeal elevation;Reduced anterior laryngeal mobility;Reduced tongue base retraction;Penetration/Apiration after swallow;Pharyngeal residue - valleculae;Pharyngeal residue - pyriform Pharyngeal Material enters airway, CONTACTS cords and then ejected out Pharyngeal- Nectar Straw Delayed swallow initiation-pyriform sinuses;Reduced pharyngeal peristalsis;Reduced epiglottic inversion;Reduced laryngeal elevation;Reduced anterior laryngeal mobility;Reduced tongue base retraction;Penetration/Apiration after swallow;Pharyngeal residue - valleculae;Pharyngeal residue - pyriform Pharyngeal Material enters airway, CONTACTS cords and then ejected out Pharyngeal- Thin Teaspoon -- Pharyngeal -- Pharyngeal- Thin Cup -- Pharyngeal --  Pharyngeal- Thin Straw Delayed swallow initiation-pyriform sinuses;Reduced pharyngeal peristalsis;Reduced epiglottic inversion;Reduced laryngeal elevation;Reduced anterior laryngeal mobility;Reduced tongue base retraction;Pharyngeal residue - valleculae;Pharyngeal residue - pyriform;Penetration/Aspiration before swallow;Moderate aspiration Pharyngeal Material enters airway, passes BELOW cords and not ejected out despite cough attempt by patient Pharyngeal- Puree Delayed swallow initiation-pyriform sinuses;Reduced pharyngeal peristalsis;Reduced epiglottic inversion;Reduced laryngeal elevation;Reduced anterior laryngeal mobility;Reduced tongue base retraction;Pharyngeal residue - valleculae;Pharyngeal residue - pyriform Pharyngeal Material does not enter airway Pharyngeal- Mechanical Soft -- Pharyngeal -- Pharyngeal- Regular -- Pharyngeal -- Pharyngeal- Multi-consistency -- Pharyngeal -- Pharyngeal- Pill -- Pharyngeal -- Pharyngeal Comment --  No flowsheet data found. No flowsheet data found. DeBlois, Riley NearingBonnie Caroline 02/23/2016, 12:34 PM                   Scheduled Meds: . allopurinol  300 mg Oral QPM  . ampicillin-sulbactam (UNASYN) IV  3 g Intravenous Q12H  . benztropine  0.5 mg Oral BID  . brimonidine  1 drop Both Eyes TID  . Carbidopa-Levodopa ER  1 tablet Oral Q1500  . Carbidopa-Levodopa ER  1.5 tablet Oral BID  . clopidogrel  75 mg Oral Daily  . dorzolamide  1 drop Both Eyes BID   And  . timolol  1 drop Both Eyes BID  . heparin  5,000 Units Subcutaneous Q8H  . insulin aspart  0-5 Units Subcutaneous QHS  . insulin aspart  0-9 Units Subcutaneous TID WC  . latanoprost  1 drop Both Eyes QHS  . pantoprazole  40 mg Oral Daily  . QUEtiapine  25 mg Oral BID WC  . QUEtiapine  50 mg Oral QHS  . sertraline  25 mg Oral QHS   Continuous Infusions:   LOS: 3 days        Ladaysha Soutar Annett Gulaaniel Mamoudou Mulvehill, MD [facility] Pager [phone]  If 7PM-7AM, please contact  night-coverage [email or web address] Password [email or web address] 02/24/2016, 7:52 AM

## 2016-02-24 NOTE — Progress Notes (Signed)
Pharmacy Antibiotic Note  Terrance SabalStephen R Perez is a 82 y.o. male admitted on 07-07-2015 with possible sepsis likely secondary to UTI. It was noted that he had a 2 week history of cough. Initially started on ceftriaxone/azithromycin for CAP and UTI. Broadened to zosyn for possible aspiration pneumonia on 11/24, which was then consolidated to unasyn as cultures returned e. Coli. Susceptibilities now show e. Coli to be resistant to ampicillin and unasyn. Pharmacy consulted to restart zosyn for aspiration pneumonia. WBC 7.9, currently afebrile.  24 hr Tmax 100.5. Due to resistance pattern and 24-hour tmax, will start zosyn immediately.    Plan: Zosyn 3.375g q8h  Monitor clinical picture F/U abx deescalation / LOT   Temp (24hrs), Avg:99 F (37.2 C), Min:98.3 F (36.8 C), Max:100.5 F (38.1 C)   Recent Labs Lab Jul 12, 2015 1319 Jul 12, 2015 1353 02/22/16 0556 02/23/16 0531 02/24/16 0423  WBC 9.6  --  10.7* 6.9 7.9  CREATININE 1.95*  --   --  2.16* 1.92*  LATICACIDVEN  --  1.75  --   --   --     Estimated Creatinine Clearance: 33.8 mL/min (by C-G formula based on SCr of 1.92 mg/dL (H)).    No Known Allergies  Antimicrobials this admission: Ceftriaxone 11/23 >> 11/23 Azithromycin 11/23>>11/24 Zosyn 11/24>>11/24; 11/26>> Unasyn 11/24>>11/25  Microbiology results: 11/23 BCx: E. Coli  R-amp and unasyn 11/23 UCx: >100,000 CFU/mL E.Coli  Imaging: 11/26 CXR : Stable ventilation since 07-07-2015 with low lung volumes and nonspecific increased interstitial opacity which could reflect atelectasis, mild interstitial edema, or viral/atypical respiratory infection.  Thank you for allowing pharmacy to be a part of this patient's care.  Alfredo BachJoseph Dejan Angert, Cleotis NipperBS, PharmD Clinical Pharmacy Resident [phone] (Pager) 02/24/2016 1:40 PM

## 2016-02-25 DIAGNOSIS — J155 Pneumonia due to Escherichia coli: Secondary | ICD-10-CM

## 2016-02-25 LAB — CBC WITH DIFFERENTIAL/PLATELET
Basophils Absolute: 0 10*3/uL (ref 0.0–0.1)
Basophils Relative: 0 %
EOS ABS: 0.4 10*3/uL (ref 0.0–0.7)
Eosinophils Relative: 5 %
HEMATOCRIT: 32.7 % — AB (ref 39.0–52.0)
HEMOGLOBIN: 10.5 g/dL — AB (ref 13.0–17.0)
LYMPHS ABS: 2.2 10*3/uL (ref 0.7–4.0)
Lymphocytes Relative: 26 %
MCH: 30.8 pg (ref 26.0–34.0)
MCHC: 32.1 g/dL (ref 30.0–36.0)
MCV: 95.9 fL (ref 78.0–100.0)
MONOS PCT: 11 %
Monocytes Absolute: 0.9 10*3/uL (ref 0.1–1.0)
NEUTROS ABS: 4.9 10*3/uL (ref 1.7–7.7)
NEUTROS PCT: 59 %
Platelets: 207 10*3/uL (ref 150–400)
RBC: 3.41 MIL/uL — AB (ref 4.22–5.81)
RDW: 16.2 % — ABNORMAL HIGH (ref 11.5–15.5)
WBC: 8.4 10*3/uL (ref 4.0–10.5)

## 2016-02-25 LAB — GLUCOSE, CAPILLARY
GLUCOSE-CAPILLARY: 166 mg/dL — AB (ref 65–99)
Glucose-Capillary: 136 mg/dL — ABNORMAL HIGH (ref 65–99)
Glucose-Capillary: 141 mg/dL — ABNORMAL HIGH (ref 65–99)
Glucose-Capillary: 140 mg/dL — ABNORMAL HIGH (ref 65–99)

## 2016-02-25 LAB — CULTURE, BLOOD (ROUTINE X 2)

## 2016-02-25 LAB — BASIC METABOLIC PANEL
Anion gap: 11 (ref 5–15)
BUN: 37 mg/dL — AB (ref 6–20)
CHLORIDE: 113 mmol/L — AB (ref 101–111)
CO2: 20 mmol/L — AB (ref 22–32)
Calcium: 8.3 mg/dL — ABNORMAL LOW (ref 8.9–10.3)
Creatinine, Ser: 2.02 mg/dL — ABNORMAL HIGH (ref 0.61–1.24)
GFR calc non Af Amer: 30 mL/min — ABNORMAL LOW (ref >60)
GFR, EST AFRICAN AMERICAN: 34 mL/min — AB (ref >60)
Glucose, Bld: 139 mg/dL — ABNORMAL HIGH (ref 65–99)
POTASSIUM: 3.1 mmol/L — AB (ref 3.5–5.1)
SODIUM: 144 mmol/L (ref 135–145)

## 2016-02-25 LAB — URINE CULTURE: Culture: >=100000 — AB

## 2016-02-25 LAB — PROCALCITONIN: Procalcitonin: 0.55 ng/mL

## 2016-02-25 MED ORDER — MORPHINE SULFATE (PF) 2 MG/ML IV SOLN
2.0000 mg | INTRAVENOUS | Status: DC | PRN
  Administered 2016-02-26 – 2016-02-28 (×3): 2 mg via INTRAVENOUS
  Filled 2016-02-25 (×3): qty 1

## 2016-02-25 MED ORDER — FUROSEMIDE 10 MG/ML IJ SOLN
60.0000 mg | Freq: Every day | INTRAMUSCULAR | Status: DC
  Administered 2016-02-25: 60 mg via INTRAVENOUS
  Filled 2016-02-25: qty 6

## 2016-02-25 MED ORDER — ORAL CARE MOUTH RINSE
15.0000 mL | Freq: Two times a day (BID) | OROMUCOSAL | Status: DC
  Administered 2016-02-25 – 2016-02-29 (×9): 15 mL via OROMUCOSAL

## 2016-02-25 MED ORDER — MORPHINE SULFATE (PF) 2 MG/ML IV SOLN
2.0000 mg | Freq: Once | INTRAVENOUS | Status: AC
Start: 2016-02-25 — End: 2016-02-25
  Administered 2016-02-25: 2 mg via INTRAVENOUS
  Filled 2016-02-25: qty 1

## 2016-02-25 MED ORDER — CHLORHEXIDINE GLUCONATE 0.12 % MT SOLN
15.0000 mL | Freq: Two times a day (BID) | OROMUCOSAL | Status: DC
  Administered 2016-02-25 – 2016-03-01 (×10): 15 mL via OROMUCOSAL
  Filled 2016-02-25 (×9): qty 15

## 2016-02-25 NOTE — Progress Notes (Signed)
PROGRESS NOTE    Terrance Perez  ZOX:[id number] DOB: 1933/10/20 DOA: 03/12/2016 PCP: Minda Meo, MD    Brief Narrative:  82 yo male with ischemic cardiomyopathy, t2dm, hypertension, schizophrenia and parkinson's, who presents with metabolic encephalopathy and hematuria. On admission confused, febrile and with tachypnea. Urine analysis with pyuria and chest film with left lower lobe infiltrate. Admitted to step down unit with the working diagnosis of sepsis. Positive e coli bacteremia, antibiotics changed to Unasyn, worsening renal failure. Responding to diuresis, noted worsening LV function per echocardiography from 30 to 20%. Persistent tachypnea. Blood cultures positive for e coli, resistant to ampicillin.    Assessment & Plan:   Active Problems:   Hypertension   Hyperlipidemia   Parkinsonism (HCC)   Encephalopathy acute   Acute encephalopathy   Systolic congestive heart failure (HCC)   AKI (acute kidney injury) (HCC)   Schizophrenia (HCC)   SIRS (systemic inflammatory response syndrome) (HCC)   1. Sepsis due to urine infection E coli (present on admission). Continue on Zosyn for antibiotic therapy, follow on cultures.  Wbc stable at 8.4. Patient has remained afebrile, with t max 100. Clinically still in heart failure, will continue diuresis with furosemide 60 mg IV q24 hours. Urine output over last 24 hours, 1375 cc.   2. Left lower lobe pneumonia (present on admission). Continue on diuresis with furosemide, oxymetry monitoring and supplemental 02 per [place] to keep 02 sat above 92%. Currently on 1 LPM with saturation 92 %. Aspiration precautions.   3. AKI on ckd stage 3. Renal function with cr at 2,0 with K at 3,1, clinically still hypervolemic, will continue diuresis with IV furosemide, follow urine output. Avoid hypotension and nephrotoxic agents. Replete K with caution, follow renal panel in am. Serum bicarbonate at 20 with non gap metabolic acidosis.   4. T2DM.  Capillary glucose [phone]. Continue glucose cover and monitoring with sliding scale. Patient with poor oral intake.   5. Systolic heart failure, chronic. Continue diuresis with furosemide, will continue to hold on B blockade or ace inh due to risk of hypotension and hemodynamic decompensation.   6. HTN. Mean blood pressure 94 to 107, will target more than 65. Will continue diuresis, holding antihypertensive agents for now.   7.Schizophrenia and parkinson. Patient more awake and alert today, will continue on carbidopa and levodopa. Quetiapine and sertraline only at night.   8. Metabolic encephalopathy. Multifactorial due to sepsis and medication induced, will continue neuro checks, supportive care and psychotropic medications only at night.   DVT prophylaxis:heparin.  Code Status:full  Family Communication:No family at the bedside Disposition Plan:Home or snf   Consultants:    Procedures:    Antimicrobials:   Zosyn #1    Subjective: No chest pain or dyspnea, having difficulty with feedings. No nausea or vomiting. Limited history due to somnolence.   Objective: Vitals:   02/24/16 1902 02/24/16 2232 02/25/16 0309 02/25/16 0740  BP: 120/68 120/71 116/70 (!) 114/101  Pulse: 82 75 85 89  Resp: (!) 31 20 (!) 29 (!) 45  Temp: 98.6 F (37 C) 98.6 F (37 C) 98.4 F (36.9 C) 98.8 F (37.1 C)  TempSrc: Axillary Axillary Axillary Axillary  SpO2: 95% 95% 96% 92%  Weight:   90.8 kg (200 lb 2.8 oz)   Height:        Intake/Output Summary (Last 24 hours) at 02/25/16 0849 Last data filed at 02/25/16 0740  Gross per 24 hour  Intake  515 ml  Output             1400 ml  Net             -885 ml   Filed Weights   02/23/16 0330 02/24/16 0404 02/25/16 0309  Weight: 89.2 kg (196 lb 10.4 oz) 92.2 kg (203 lb 4.2 oz) 90.8 kg (200 lb 2.8 oz)    Examination:  General exam: deconditioned and ill looking appearing E ENT: mild pallor no icterus, oral  mucosa dry.   Respiratory system: decreased breath sounds with scattered rhonchi and rales, no wheezing. Tachypnea.  Cardiovascular system: S1 & S2 heard, RRR. No JVD, murmurs, rubs, gallops or clicks. No pedal edema. Gastrointestinal system: Abdomen is nondistended, soft and nontender. No organomegaly or masses felt. Normal bowel sounds heard. Central nervous system: somnolent, follows commands. Non focal.  Extremities: Symmetric 5 x 5 power. Skin: No rashes, lesions or ulcers    Data Reviewed: I have personally reviewed following labs and imaging studies  CBC:  Recent Labs Lab 01/31/2016 1319 02/22/16 0556 02/23/16 0531 02/24/16 0423 02/25/16 0543  WBC 9.6 10.7* 6.9 7.9 8.4  NEUTROABS 7.5  --  4.6 4.7 4.9  HGB 10.1* 10.6* 10.1* 10.4* 10.5*  HCT 30.9* 33.1* 32.0* 32.7* 32.7*  MCV 93.6 95.9 95.2 95.6 95.9  PLT 207 204 211 210 207   Basic Metabolic Panel:  Recent Labs Lab 02/23/2016 1319 02/23/16 0531 02/24/16 0423 02/25/16 0543  NA 132* 138 140 144  K 3.6 3.7 3.1* 3.1*  CL 101 106 109 113*  CO2 22 21* 20* 20*  GLUCOSE 191* 102* 122* 139*  BUN 27* 34* 34* 37*  CREATININE 1.95* 2.16* 1.92* 2.02*  CALCIUM 8.1* 8.2* 8.1* 8.3*   GFR: Estimated Creatinine Clearance: 31.9 mL/min (by C-G formula based on SCr of 2.02 mg/dL (H)). Liver Function Tests:  Recent Labs Lab 01/31/2016 1319  AST 77*  ALT 11*  ALKPHOS 75  BILITOT 1.6*  PROT 6.2*  ALBUMIN 2.8*   No results for input(s): LIPASE, AMYLASE in the last 168 hours. No results for input(s): AMMONIA in the last 168 hours. Coagulation Profile: No results for input(s): INR, PROTIME in the last 168 hours. Cardiac Enzymes: No results for input(s): CKTOTAL, CKMB, CKMBINDEX, TROPONINI in the last 168 hours. BNP (last 3 results) No results for input(s): PROBNP in the last 8760 hours. HbA1C: No results for input(s): HGBA1C in the last 72 hours. CBG:  Recent Labs Lab 02/23/16 2133 02/24/16 0739 02/24/16 1229  02/24/16 1728 02/24/16 2058  GLUCAP 121* 116* 91 128* 167*   Lipid Profile: No results for input(s): CHOL, HDL, LDLCALC, TRIG, CHOLHDL, LDLDIRECT in the last 72 hours. Thyroid Function Tests: No results for input(s): TSH, T4TOTAL, FREET4, T3FREE, THYROIDAB in the last 72 hours. Anemia Panel: No results for input(s): VITAMINB12, FOLATE, FERRITIN, TIBC, IRON, RETICCTPCT in the last 72 hours. Sepsis Labs:  Recent Labs Lab 02/03/2016 1353 02/23/2016 1655 02/23/16 0531 02/25/16 0543  PROCALCITON  --  0.28 0.84 0.55  LATICACIDVEN 1.75  --   --   --     Recent Results (from the past 240 hour(s))  Blood Culture (routine x 2)     Status: Abnormal (Preliminary result)   Collection Time: 02/19/2016  1:19 PM  Result Value Ref Range Status   Specimen Description BLOOD RIGHT FOREARM  Final   Special Requests BOTTLES DRAWN AEROBIC AND ANAEROBIC  5CC  Final   Culture  Setup Time   Final  GRAM NEGATIVE RODS IN BOTH AEROBIC AND ANAEROBIC BOTTLES CRITICAL RESULT CALLED TO, READ BACK BY AND VERIFIED WITH: T STONE,PHARMD AT 1016 02/22/16 BY L BENFIELD    Culture ESCHERICHIA COLI (A)  Final   Report Status PENDING  Incomplete   Organism ID, Bacteria ESCHERICHIA COLI  Final      Susceptibility   Escherichia coli - MIC*    AMPICILLIN >=32 RESISTANT Resistant     CEFAZOLIN 16 SENSITIVE Sensitive     CEFEPIME <=1 SENSITIVE Sensitive     CEFTAZIDIME <=1 SENSITIVE Sensitive     CEFTRIAXONE <=1 SENSITIVE Sensitive     CIPROFLOXACIN <=0.25 SENSITIVE Sensitive     GENTAMICIN <=1 SENSITIVE Sensitive     IMIPENEM <=0.25 SENSITIVE Sensitive     TRIMETH/SULFA <=20 SENSITIVE Sensitive     AMPICILLIN/SULBACTAM >=32 RESISTANT Resistant     PIP/TAZO <=4 SENSITIVE Sensitive     Extended ESBL NEGATIVE Sensitive     * ESCHERICHIA COLI  Blood Culture ID Panel (Reflexed)     Status: Abnormal   Collection Time: 03/10/16  1:19 PM  Result Value Ref Range Status   Enterococcus species NOT DETECTED NOT DETECTED  Final   Listeria monocytogenes NOT DETECTED NOT DETECTED Final   Staphylococcus species NOT DETECTED NOT DETECTED Final   Staphylococcus aureus NOT DETECTED NOT DETECTED Final   Streptococcus species NOT DETECTED NOT DETECTED Final   Streptococcus agalactiae NOT DETECTED NOT DETECTED Final   Streptococcus pneumoniae NOT DETECTED NOT DETECTED Final   Streptococcus pyogenes NOT DETECTED NOT DETECTED Final   Acinetobacter baumannii NOT DETECTED NOT DETECTED Final   Enterobacteriaceae species DETECTED (A) NOT DETECTED Final    Comment: CRITICAL RESULT CALLED TO, READ BACK BY AND VERIFIED WITH: T STONE,PHARMD AT 1016 02/22/16 BY L BENFIELD    Enterobacter cloacae complex NOT DETECTED NOT DETECTED Final   Escherichia coli DETECTED (A) NOT DETECTED Final    Comment: CRITICAL RESULT CALLED TO, READ BACK BY AND VERIFIED WITH: T STONE,PHARMD AT 1016 02/22/16 BY L BENFIELD    Klebsiella oxytoca NOT DETECTED NOT DETECTED Final   Klebsiella pneumoniae NOT DETECTED NOT DETECTED Final   Proteus species NOT DETECTED NOT DETECTED Final   Serratia marcescens NOT DETECTED NOT DETECTED Final   Carbapenem resistance NOT DETECTED NOT DETECTED Final   Haemophilus influenzae NOT DETECTED NOT DETECTED Final   Neisseria meningitidis NOT DETECTED NOT DETECTED Final   Pseudomonas aeruginosa NOT DETECTED NOT DETECTED Final   Candida albicans NOT DETECTED NOT DETECTED Final   Candida glabrata NOT DETECTED NOT DETECTED Final   Candida krusei NOT DETECTED NOT DETECTED Final   Candida parapsilosis NOT DETECTED NOT DETECTED Final   Candida tropicalis NOT DETECTED NOT DETECTED Final  Blood Culture (routine x 2)     Status: None (Preliminary result)   Collection Time: 03-10-2016  1:56 PM  Result Value Ref Range Status   Specimen Description BLOOD LEFT FOREARM  Final   Special Requests BOTTLES DRAWN AEROBIC AND ANAEROBIC  5CC  Final   Culture NO GROWTH 3 DAYS  Final   Report Status PENDING  Incomplete  Urine culture      Status: Abnormal   Collection Time: March 10, 2016  2:02 PM  Result Value Ref Range Status   Specimen Description URINE, RANDOM  Final   Special Requests NONE  Final   Culture >=100,000 COLONIES/mL ESCHERICHIA COLI (A)  Final   Report Status 02/25/2016 FINAL  Final   Organism ID, Bacteria ESCHERICHIA COLI (A)  Final  Susceptibility   Escherichia coli - MIC*    AMPICILLIN >=32 RESISTANT Resistant     CEFAZOLIN <=4 SENSITIVE Sensitive     CEFTRIAXONE <=1 SENSITIVE Sensitive     CIPROFLOXACIN <=0.25 SENSITIVE Sensitive     GENTAMICIN <=1 SENSITIVE Sensitive     IMIPENEM <=0.25 SENSITIVE Sensitive     NITROFURANTOIN <=16 SENSITIVE Sensitive     TRIMETH/SULFA <=20 SENSITIVE Sensitive     AMPICILLIN/SULBACTAM >=32 RESISTANT Resistant     PIP/TAZO <=4 SENSITIVE Sensitive     Extended ESBL NEGATIVE Sensitive     * >=100,000 COLONIES/mL ESCHERICHIA COLI  MRSA PCR Screening     Status: None   Collection Time: 02/01/2016 10:30 PM  Result Value Ref Range Status   MRSA by PCR NEGATIVE NEGATIVE Final    Comment:        The GeneXpert MRSA Assay (FDA approved for NASAL specimens only), is one component of a comprehensive MRSA colonization surveillance program. It is not intended to diagnose MRSA infection nor to guide or monitor treatment for MRSA infections.   Culture, blood (routine x 2)     Status: None (Preliminary result)   Collection Time: 02/22/16  4:47 PM  Result Value Ref Range Status   Specimen Description BLOOD LEFT ANTECUBITAL  Final   Special Requests BOTTLES DRAWN AEROBIC AND ANAEROBIC 5CC EA  Final   Culture NO GROWTH 2 DAYS  Final   Report Status PENDING  Incomplete  Culture, blood (routine x 2)     Status: None (Preliminary result)   Collection Time: 02/22/16  4:53 PM  Result Value Ref Range Status   Specimen Description BLOOD RIGHT HAND  Final   Special Requests BOTTLES DRAWN AEROBIC AND ANAEROBIC 10CC EA  Final   Culture NO GROWTH 2 DAYS  Final   Report Status  PENDING  Incomplete         Radiology Studies: Dg Chest Port 1 View  Result Date: 02/24/2016 CLINICAL DATA:  82 year old male with tachypnea. Initial encounter. EXAM: PORTABLE CHEST 1 VIEW COMPARISON:  02/23/2016 and earlier. FINDINGS: Portable AP semi upright view at 0758 hours. Continued low lung volumes. Stable cardiac size and mediastinal contours. Continued increased interstitial opacity diffusely, stable since 02/05/2016. No superimposed pneumothorax, pleural effusion or consolidation. Small volume of oral contrast now in the epigastrium. IMPRESSION: Stable ventilation since 02/07/2016 with low lung volumes and nonspecific increased interstitial opacity which could reflect atelectasis, mild interstitial edema, or viral/atypical respiratory infection. Electronically Signed   By: Odessa FlemingH  Hall M.D.   On: 02/24/2016 09:02   Dg Chest Port 1 View  Result Date: 02/23/2016 CLINICAL DATA:  Dyspnea EXAM: PORTABLE CHEST 1 VIEW COMPARISON:  02/10/2016 FINDINGS: Cardiac shadow is mildly enlarged. Aortic calcifications are again seen. 4 inspiratory effort is noted with persistent vascular congestion. No bony abnormality is seen. IMPRESSION: Persistent vascular congestion and poor inspiratory effort. Electronically Signed   By: Alcide CleverMark  Lukens M.D.   On: 02/23/2016 11:11   Dg Swallowing Func-speech Pathology  Result Date: 02/23/2016 Objective Swallowing Evaluation: Type of Study: MBS-Modified Barium Swallow Study Patient Details Name: Allyson SabalStephen R Kydd MRN: [id number] Date of Birth: [birth year] Today's Date: 02/23/2016 Time: SLP Start Time (ACUTE ONLY): 1109-SLP Stop Time (ACUTE ONLY): 1130 SLP Time Calculation (min) (ACUTE ONLY): 21 min Past Medical History: Past Medical History: Diagnosis Date . Abnormality of gait 12/02/2012 . Coronary artery disease  . CVD (cerebrovascular disease)  . Diabetes mellitus  . Diastolic dysfunction  . Dysphagia, pharyngoesophageal phase 06/08/2014  Thickened fluids .  Dyspnea  .  Fracture, humerus   right . Glaucoma  . Gout  . History of orthostatic hypotension  . Hyperlipidemia  . Hypertension  . Obesity  . Paranoid schizophrenia (HCC)  . Parkinsonism (HCC) 12/02/2012 . PNA (pneumonia) May 2015 . RLS (restless legs syndrome) 02/04/2016 . Tremor  Past Surgical History: Past Surgical History: Procedure Laterality Date . ASD REPAIR   . CARDIAC CATHETERIZATION  08/31/2006  MODERATE INFERIOR WALL HYPOKINESIA WITH EF 45% . CHOLECYSTECTOMY   . CORONARY ARTERY BYPASS GRAFT  2008  LIMA GRAFT TO THE LAD, SAPHENOUS VEIN GRAFT TO THE DIAGONAL , SEQUENTIAL SAPHENOUS VEIN GRAFT TO THE PDA, POSTERIOR LATERAL , AND OBTUSE MARGINAL VESSELS. . TONSILLECTOMY   HPI: 82 y.o.malewith medical history significant for CAD, CABG, DM, dysphagia, glaucoma, gout, HLD, HTN, schizophrenia, Parkinson's, RLS presenting with cough, confusion, weakness, and gross hematuria. Symptoms progressive x2 weeks. Dx acute encephalopathy, UTI, possible pna. 11/23 CXR shows increased interstitial and airspace opacity in the left mid to lower lung, may represent infiltrate. Pt spends most of day at home in a lift chair; [facility]  [name] cares for her husband at home.  She thickens his liquids (consistency unclear); feeds him his meals; he sleeps in lift chair and she sleeps next to him in recliner in living room.   Subjective: alert, but sleepy Assessment / Plan / Recommendation CHL IP CLINICAL IMPRESSIONS 02/23/2016 Therapy Diagnosis Moderate oral phase dysphagia;Severe pharyngeal phase dysphagia Clinical Impression  Pt presents with a severe neuromuscular dysphagia likely due to history of Parkinson's.  Poor participation and generalized weakness worsened function. Pt could not sit upright in the MBS chair; would not bend at the waist and thus has a posterior lean requiring a towel to support pts head. He would not attempt a chin tuck despite max tactile cues and refused puree. He  insisted on straw sips of liquids. Primary problems included lingual weakness and poor bolus formation with passive spillage of liquids to the pharynx. Base of tongue movement and hyolaryngeal mobility were severely weak causing moderate to severe residuals that pt could not clear despite cueing. Pt sensed penetrate of residual and effectively expelled this. Thin was attempted to reduce residue, but resulted in severe aspiration before the swallow. Would recommend pt continue current diet (dys 1 puree with nectar thick liquids) with high risk of aspiration and poor prognosis for improvement given participation.  Impact on safety and function Severe aspiration risk   CHL IP TREATMENT RECOMMENDATION 02/23/2016 Treatment Recommendations Therapy as outlined in treatment plan below   Prognosis 02/23/2016 Prognosis for Safe Diet Advancement Guarded Barriers to Reach Goals Motivation Barriers/Prognosis Comment -- CHL IP DIET RECOMMENDATION 02/23/2016 SLP Diet Recommendations Dysphagia 1 (Puree) solids;Nectar thick liquid Liquid Administration via Straw Medication Administration Whole meds with puree Compensations Slow rate;Small sips/bites;Effortful swallow;Multiple dry swallows after each bite/sip;Follow solids with liquid Postural Changes --   CHL IP OTHER RECOMMENDATIONS 02/23/2016 Recommended Consults -- Oral Care Recommendations Oral care BID Other Recommendations --   CHL IP FOLLOW UP RECOMMENDATIONS 02/23/2016 Follow up Recommendations [facility]illed Nursing facility   [facility]REQUENC[facility] AND DURATION 02/23/2016 Speech Therapy Frequency (ACUTE ONLY) min 2x/week Treatment Duration 2 weeks      CHL IP ORAL PHASE 02/23/2016 Oral Phase Impaired Oral - Pudding Teaspoon -- Oral - Pudding Cup -- Oral - Honey Teaspoon -- Oral - Honey Cup -- Oral - Nectar Teaspoon -- Oral - Nectar Cup Weak lingual manipulation;Reduced posterior propulsion;Lingual/palatal residue;Left  pocketing in lateral sulci;Right pocketing in lateral sulci;Delayed  oral transit;Decreased bolus cohesion;Premature spillage Oral - Nectar Straw Weak lingual manipulation;Reduced posterior propulsion;Lingual/palatal residue;Left pocketing in lateral sulci;Right pocketing in lateral sulci;Delayed oral transit;Decreased bolus cohesion;Premature spillage Oral - Thin Teaspoon -- Oral - Thin Cup -- Oral - Thin Straw Weak lingual manipulation;Reduced posterior propulsion;Lingual/palatal residue;Left pocketing in lateral sulci;Right pocketing in lateral sulci;Delayed oral transit;Decreased bolus cohesion;Premature spillage Oral - Puree Weak lingual manipulation;Reduced posterior propulsion;Lingual/palatal residue;Left pocketing in lateral sulci;Right pocketing in lateral sulci;Delayed oral transit;Decreased bolus cohesion;Premature spillage Oral - Mech Soft -- Oral - Regular -- Oral - Multi-Consistency -- Oral - Pill -- Oral Phase - Comment --  CHL IP PHARYNGEAL PHASE 02/23/2016 Pharyngeal Phase Impaired Pharyngeal- Pudding Teaspoon -- Pharyngeal -- Pharyngeal- Pudding Cup -- Pharyngeal -- Pharyngeal- Honey Teaspoon -- Pharyngeal -- Pharyngeal- Honey Cup -- Pharyngeal -- Pharyngeal- Nectar Teaspoon -- Pharyngeal -- Pharyngeal- Nectar Cup Delayed swallow initiation-pyriform sinuses;Reduced pharyngeal peristalsis;Reduced epiglottic inversion;Reduced laryngeal elevation;Reduced anterior laryngeal mobility;Reduced tongue base retraction;Penetration/Apiration after swallow;Pharyngeal residue - valleculae;Pharyngeal residue - pyriform Pharyngeal Material enters airway, CONTACTS cords and then ejected out Pharyngeal- Nectar Straw Delayed swallow initiation-pyriform sinuses;Reduced pharyngeal peristalsis;Reduced epiglottic inversion;Reduced laryngeal elevation;Reduced anterior laryngeal mobility;Reduced tongue base retraction;Penetration/Apiration after swallow;Pharyngeal residue - valleculae;Pharyngeal residue - pyriform Pharyngeal Material enters airway, CONTACTS cords and then ejected out  Pharyngeal- Thin Teaspoon -- Pharyngeal -- Pharyngeal- Thin Cup -- Pharyngeal -- Pharyngeal- Thin Straw Delayed swallow initiation-pyriform sinuses;Reduced pharyngeal peristalsis;Reduced epiglottic inversion;Reduced laryngeal elevation;Reduced anterior laryngeal mobility;Reduced tongue base retraction;Pharyngeal residue - valleculae;Pharyngeal residue - pyriform;Penetration/Aspiration before swallow;Moderate aspiration Pharyngeal Material enters airway, passes BELOW cords and not ejected out despite cough attempt by patient Pharyngeal- Puree Delayed swallow initiation-pyriform sinuses;Reduced pharyngeal peristalsis;Reduced epiglottic inversion;Reduced laryngeal elevation;Reduced anterior laryngeal mobility;Reduced tongue base retraction;Pharyngeal residue - valleculae;Pharyngeal residue - pyriform Pharyngeal Material does not enter airway Pharyngeal- Mechanical Soft -- Pharyngeal -- Pharyngeal- Regular -- Pharyngeal -- Pharyngeal- Multi-consistency -- Pharyngeal -- Pharyngeal- Pill -- Pharyngeal -- Pharyngeal Comment --  No flowsheet data found. No flowsheet data found. DeBlois, Riley Nearing 02/23/2016, 12:34 PM                   Scheduled Meds: . allopurinol  300 mg Oral QPM  . benztropine  0.5 mg Oral BID  . brimonidine  1 drop Both Eyes TID  . Carbidopa-Levodopa ER  1 tablet Oral Q1500  . Carbidopa-Levodopa ER  1.5 tablet Oral BID  . chlorhexidine  15 mL Mouth Rinse BID  . clopidogrel  75 mg Oral Daily  . dorzolamide  1 drop Both Eyes BID   And  . timolol  1 drop Both Eyes BID  . heparin  5,000 Units Subcutaneous Q8H  . insulin aspart  0-5 Units Subcutaneous QHS  . insulin aspart  0-9 Units Subcutaneous TID WC  . latanoprost  1 drop Both Eyes QHS  . mouth rinse  15 mL Mouth Rinse q12n4p  . pantoprazole  40 mg Oral Daily  . piperacillin-tazobactam (ZOSYN)  IV  3.375 g Intravenous Q8H  . QUEtiapine  50 mg Oral QHS  . sertraline  25 mg Oral QHS   Continuous Infusions:   LOS: 4 days          Mauricio Annett Gula, MD [facility] Pager [phone]  If 7PM-7AM, please contact night-coverage [email or web address] Password [facility] 02/25/2016, 8:49 AM

## 2016-02-25 NOTE — Care Management Important Message (Signed)
Important Message  Patient Details  Name: Allyson SabalStephen R Ohlsen MRN: [id number] Date of Birth: 1933/05/25   Medicare Important Message Given:  Yes    Kyla BalzarineShealy, Richele Strand Abena 02/25/2016, 11:43 AM

## 2016-02-26 LAB — CBC WITH DIFFERENTIAL/PLATELET
Basophils Absolute: 0.1 10*3/uL (ref 0.0–0.1)
Basophils Relative: 1 %
EOS PCT: 3 %
Eosinophils Absolute: 0.3 10*3/uL (ref 0.0–0.7)
HCT: 33.3 % — ABNORMAL LOW (ref 39.0–52.0)
Hemoglobin: 10.4 g/dL — ABNORMAL LOW (ref 13.0–17.0)
LYMPHS ABS: 2.1 10*3/uL (ref 0.7–4.0)
LYMPHS PCT: 23 %
MCH: 30.3 pg (ref 26.0–34.0)
MCHC: 31.2 g/dL (ref 30.0–36.0)
MCV: 97.1 fL (ref 78.0–100.0)
MONO ABS: 0.9 10*3/uL (ref 0.1–1.0)
MONOS PCT: 10 %
Neutro Abs: 5.7 10*3/uL (ref 1.7–7.7)
Neutrophils Relative %: 63 %
PLATELETS: 186 10*3/uL (ref 150–400)
RBC: 3.43 MIL/uL — ABNORMAL LOW (ref 4.22–5.81)
RDW: 16.6 % — AB (ref 11.5–15.5)
WBC: 9.1 10*3/uL (ref 4.0–10.5)

## 2016-02-26 LAB — CULTURE, BLOOD (ROUTINE X 2): Culture: NO GROWTH

## 2016-02-26 LAB — GLUCOSE, CAPILLARY
GLUCOSE-CAPILLARY: 117 mg/dL — AB (ref 65–99)
GLUCOSE-CAPILLARY: 145 mg/dL — AB (ref 65–99)
Glucose-Capillary: 120 mg/dL — ABNORMAL HIGH (ref 65–99)
Glucose-Capillary: 141 mg/dL — ABNORMAL HIGH (ref 65–99)

## 2016-02-26 LAB — BASIC METABOLIC PANEL
Anion gap: 9 (ref 5–15)
BUN: 41 mg/dL — ABNORMAL HIGH (ref 6–20)
CALCIUM: 8.4 mg/dL — AB (ref 8.9–10.3)
CHLORIDE: 116 mmol/L — AB (ref 101–111)
CO2: 23 mmol/L (ref 22–32)
CREATININE: 2.14 mg/dL — AB (ref 0.61–1.24)
GFR calc Af Amer: 32 mL/min — ABNORMAL LOW (ref >60)
GFR calc non Af Amer: 28 mL/min — ABNORMAL LOW (ref >60)
Glucose, Bld: 135 mg/dL — ABNORMAL HIGH (ref 65–99)
POTASSIUM: 3 mmol/L — AB (ref 3.5–5.1)
Sodium: 148 mmol/L — ABNORMAL HIGH (ref 135–145)

## 2016-02-26 MED ORDER — SODIUM CHLORIDE 0.9 % IV SOLN
30.0000 meq | Freq: Once | INTRAVENOUS | Status: AC
  Administered 2016-02-26: 30 meq via INTRAVENOUS
  Filled 2016-02-26: qty 15

## 2016-02-26 MED ORDER — FUROSEMIDE 40 MG PO TABS
40.0000 mg | ORAL_TABLET | Freq: Every day | ORAL | Status: DC
  Administered 2016-02-26 – 2016-02-27 (×2): 40 mg via ORAL
  Filled 2016-02-26 (×2): qty 1

## 2016-02-26 NOTE — Progress Notes (Signed)
Initial Nutrition Assessment  DOCUMENTATION CODES:   Not applicable  INTERVENTION:    Magic cup TID with meals, each supplement provides 290 kcal and 9 grams of protein  NUTRITION DIAGNOSIS:   Inadequate oral intake related to dysphagia as evidenced by meal completion < 25%.  GOAL:   Patient will meet greater than or equal to 90% of their needs  MONITOR:   PO intake, Supplement acceptance, Skin  REASON FOR ASSESSMENT:   Low Braden    ASSESSMENT:   82 y.o. male with PMH of CAD, CABG, DM, dysphagia, glaucoma, gout, HLD, HTN, schizophrenia, Parkinson's, RLS presenting with cough, confusion, weakness, and gross hematuria. Being treated for acute encephalopathy and AKI.   Discussed patient with RN today. Patient is coughing with meals. SLP following. Currently on a dysphagia 1 diet with nectar thick liquids; consuming 10% of meals. Wife at bedside during meal times to assist with meals.  Remains a full code.  Unable to complete Nutrition-Focused physical exam at this time.  Labs reviewed sodium elevated, potassium low. Medications reviewed and include lasix.  Diet Order:  DIET - DYS 1 Room service appropriate? Yes; Fluid consistency: Nectar Thick  Skin:  Wound (see comment) (Patient with 3 non pressure related wounds to legs. At risk for pressure related skin breakdown to heels and sacrum. )  Last BM:  11/27  Height:   Ht Readings from Last 1 Encounters:  14-Jul-2015 5\' 7"  (1.702 m)    Weight:   Wt Readings from Last 1 Encounters:  02/26/16 198 lb 13.7 oz (90.2 kg)    Ideal Body Weight:  67.3 kg  BMI:  Body mass index is 31.15 kg/m.  Estimated Nutritional Needs:   Kcal:  1900-2100  Protein:  100-115 gm  Fluid:  >/= 2 L  EDUCATION NEEDS:   No education needs identified at this time  Joaquin CourtsKimberly Ilan Kahrs, RD, LDN, CNSC Pager [phone] After Hours Pager [phone]

## 2016-02-26 NOTE — Care Management Note (Addendum)
Case Management Note  Patient Details  Name: Allyson SabalStephen R Kathol MRN: [id number] Date of Birth: [birth year]  Subjective/Objective:   Presents with sepsis, asp pna, aki stage 3, syst heart failure, schizophrenia and parkinson, metabolic encephaloapthy.  conts on iv abx, iv lasix, iv pain meds.  He is aspirating per notes, cont to be hypervolemic will cont to diurese and hold anti hypertensives to avoid hypotension .   Await pt eval.  NCM will cont to follow for dc needs.    02/27/16 1449 Letha Capeeborah Dorsel Flinn RN, BSN - Per pt eval rec SNF, CSW referral.  Per wife patient had [facility] before with HHPT, hopefully he will get rehab to get back to where he was before .               Action/Plan:   Expected Discharge Date:                  Expected Discharge Plan:  Skilled Nursing Facility  In-House Referral:  Clinical Social Work  Discharge planning Services  CM Consult  Post Acute Care Choice:    Choice offered to:     DME Arranged:    DME Agency:     HH Arranged:    HH Agency:     Status of Service:  In process, will continue to follow  If discussed at Long Length of Stay Meetings, dates discussed:    Additional Comments:  Leone Havenaylor, Presley Gora Clinton, RN 02/26/2016, 4:15 PM

## 2016-02-26 NOTE — Progress Notes (Signed)
PROGRESS NOTE    Terrance Perez  UJW:[id number] DOB: 1934/02/23 DOA: 02/07/2016 PCP: Minda MeoARONSON,RICHARD A, MD    Brief Narrative:  82 yo male with ischemic cardiomyopathy, t2dm, hypertension, schizophrenia and parkinson's, who presents with metabolic encephalopathy and hematuria. On admission confused, febrile and with tachypnea. Urine analysis with pyuria and chest film with left lower lobe infiltrate. Admitted to step down unit with the working diagnosis of sepsis. Positive e coli bacteremia, antibiotics changed to Unasyn, worsening renal failure. Responding to diuresis, noted worsening LV function per echocardiography from 30 to 20%. Persistent tachypnea. Blood cultures positive for e coli, resistant to ampicillin. Intermittent tachypnea, with sings of aspiration.    Assessment & Plan:   Active Problems:   Hypertension   Hyperlipidemia   Parkinsonism (HCC)   Encephalopathy acute   Acute encephalopathy   Systolic congestive heart failure (HCC)   AKI (acute kidney injury) (HCC)   Schizophrenia (HCC)   SIRS (systemic inflammatory response syndrome) (HCC)      1. Sepsis due to urine infection E coli (present on admission). On Zosyn for antibiotic therapy, follow on cultures blood cultures no growth.  Wbc stable at 9. Patient has remained afebrile. Will continue blood pressure monitoring.   2. Left lower lobe pneumonia (present on admission). Will change to po furosemide, oxymetry monitoring and supplemental 02 per [place] to keep 02 sat above 92%. Currently on 1 LPM with saturation 95%. Aspiration precautions.   3. AKI on ckd stage 3. Renal function with cr stable at 2,1 with K at 3,0, will continue diuresis with po furosemide, follow urine output. Avoid hypotension and nephrotoxic agents. Replete K with caution, follow renal panel in am. Serum bicarbonate at 23 with resolved acidosis.   4. T2DM. Capillary glucose 117-120-145.Continue glucose cover and monitoring with sliding  scale. Patient with poor oral intake, follow on speech evaluation.   5. Systolic heart failure, chronic. Continue diuresis with po furosemide, will continue to hold on B blockade or ace inh due to risk of hypotension and hemodynamic decompensation.   6. HTN. Mean blood greater than 65. Will continue with po diuresis, holding antihypertensive agents for now.   7.Schizophrenia and parkinson. Patient more awake and alert, continue on carbidopa and levodopa. Quetiapine and sertraline only at night.   8. Metabolic encephalopathy. Multifactorial due to sepsis and medication induced, continue supportive care and psychotropic medications only at night.   DVT prophylaxis:heparin.  Code Status:full  Family Communication:No family at the bedside Disposition Plan:Home or snf   Consultants:    Procedures:    Antimicrobials:  Zosyn #3   Subjective: Patient more reactive, has episodes of tachypnea last night up to 40 bpm, noted possible aspiration with meals. No chest pain, no agitation.   Objective: Vitals:   02/25/16 1901 02/25/16 2251 02/26/16 0417 02/26/16 0726  BP: 125/77 124/69 130/74 (!) 111/55  Pulse: 88 78 83 74  Resp: (!) 38 (!) 30 (!) 28 (!) 28  Temp: 98.1 F (36.7 C) 98.5 F (36.9 C) 97.5 F (36.4 C) 97.4 F (36.3 C)  TempSrc: Axillary Axillary Axillary Oral  SpO2: 95% 95% 94% 94%  Weight:   90.2 kg (198 lb 13.7 oz)   Height:        Intake/Output Summary (Last 24 hours) at 02/26/16 0805 Last data filed at 02/26/16 0728  Gross per 24 hour  Intake              650 ml  Output  1400 ml  Net             -750 ml   Filed Weights   02/24/16 0404 02/25/16 0309 02/26/16 0417  Weight: 92.2 kg (203 lb 4.2 oz) 90.8 kg (200 lb 2.8 oz) 90.2 kg (198 lb 13.7 oz)    Examination:  General exam: deconditioned, not in pain or dyspnea E ENT: mild pallor, no icterus, oral mucosa moist.  Respiratory system: Mild decreased breath sounds at bases, poor  inspiratory effort. Scattered rales, no wheezing, or rhonchi.  Cardiovascular system: S1 & S2 heard, RRR. No JVD, murmurs, rubs, gallops or clicks. No pedal edema. Gastrointestinal system: Abdomen is nondistended, soft and nontender. No organomegaly or masses felt. Normal bowel sounds heard. Central nervous system: Somnolent.  No focal neurological deficits. Extremities: Symmetric 5 x 5 power. Skin: No rashes, lesions or ulcers.     Data Reviewed: I have personally reviewed following labs and imaging studies  CBC:  Recent Labs Lab 02/13/2016 1319 02/22/16 0556 02/23/16 0531 02/24/16 0423 02/25/16 0543 02/26/16 0520  WBC 9.6 10.7* 6.9 7.9 8.4 9.1  NEUTROABS 7.5  --  4.6 4.7 4.9 5.7  HGB 10.1* 10.6* 10.1* 10.4* 10.5* 10.4*  HCT 30.9* 33.1* 32.0* 32.7* 32.7* 33.3*  MCV 93.6 95.9 95.2 95.6 95.9 97.1  PLT 207 204 211 210 207 186   Basic Metabolic Panel:  Recent Labs Lab 02/25/2016 1319 02/23/16 0531 02/24/16 0423 02/25/16 0543 02/26/16 0520  NA 132* 138 140 144 148*  K 3.6 3.7 3.1* 3.1* 3.0*  CL 101 106 109 113* 116*  CO2 22 21* 20* 20* 23  GLUCOSE 191* 102* 122* 139* 135*  BUN 27* 34* 34* 37* 41*  CREATININE 1.95* 2.16* 1.92* 2.02* 2.14*  CALCIUM 8.1* 8.2* 8.1* 8.3* 8.4*   GFR: Estimated Creatinine Clearance: 30 mL/min (by C-G formula based on SCr of 2.14 mg/dL (H)). Liver Function Tests:  Recent Labs Lab 02/06/2016 1319  AST 77*  ALT 11*  ALKPHOS 75  BILITOT 1.6*  PROT 6.2*  ALBUMIN 2.8*   No results for input(s): LIPASE, AMYLASE in the last 168 hours. No results for input(s): AMMONIA in the last 168 hours. Coagulation Profile: No results for input(s): INR, PROTIME in the last 168 hours. Cardiac Enzymes: No results for input(s): CKTOTAL, CKMB, CKMBINDEX, TROPONINI in the last 168 hours. BNP (last 3 results) No results for input(s): PROBNP in the last 8760 hours. HbA1C: No results for input(s): HGBA1C in the last 72 hours. CBG:  Recent Labs Lab  02/25/16 0733 02/25/16 1132 02/25/16 1740 02/25/16 2121 02/26/16 0721  GLUCAP 136* 141* 166* 140* 117*   Lipid Profile: No results for input(s): CHOL, HDL, LDLCALC, TRIG, CHOLHDL, LDLDIRECT in the last 72 hours. Thyroid Function Tests: No results for input(s): TSH, T4TOTAL, FREET4, T3FREE, THYROIDAB in the last 72 hours. Anemia Panel: No results for input(s): VITAMINB12, FOLATE, FERRITIN, TIBC, IRON, RETICCTPCT in the last 72 hours. Sepsis Labs:  Recent Labs Lab 02/18/2016 1353 02/11/2016 1655 02/23/16 0531 02/25/16 0543  PROCALCITON  --  0.28 0.84 0.55  LATICACIDVEN 1.75  --   --   --     Recent Results (from the past 240 hour(s))  Blood Culture (routine x 2)     Status: Abnormal   Collection Time: 02/03/2016  1:19 PM  Result Value Ref Range Status   Specimen Description BLOOD RIGHT FOREARM  Final   Special Requests BOTTLES DRAWN AEROBIC AND ANAEROBIC  5CC  Final   Culture  Setup Time  Final    GRAM NEGATIVE RODS IN BOTH AEROBIC AND ANAEROBIC BOTTLES CRITICAL RESULT CALLED TO, READ BACK BY AND VERIFIED WITH: T STONE,PHARMD AT 1016 02/22/16 BY L BENFIELD    Culture ESCHERICHIA COLI (A)  Final   Report Status 02/25/2016 FINAL  Final   Organism ID, Bacteria ESCHERICHIA COLI  Final      Susceptibility   Escherichia coli - MIC*    AMPICILLIN >=32 RESISTANT Resistant     CEFAZOLIN 16 SENSITIVE Sensitive     CEFEPIME <=1 SENSITIVE Sensitive     CEFTAZIDIME <=1 SENSITIVE Sensitive     CEFTRIAXONE <=1 SENSITIVE Sensitive     CIPROFLOXACIN <=0.25 SENSITIVE Sensitive     GENTAMICIN <=1 SENSITIVE Sensitive     IMIPENEM <=0.25 SENSITIVE Sensitive     TRIMETH/SULFA <=20 SENSITIVE Sensitive     AMPICILLIN/SULBACTAM >=32 RESISTANT Resistant     PIP/TAZO <=4 SENSITIVE Sensitive     Extended ESBL NEGATIVE Sensitive     * ESCHERICHIA COLI  Blood Culture ID Panel (Reflexed)     Status: Abnormal   Collection Time: 02/05/2016  1:19 PM  Result Value Ref Range Status   Enterococcus  species NOT DETECTED NOT DETECTED Final   Listeria monocytogenes NOT DETECTED NOT DETECTED Final   Staphylococcus species NOT DETECTED NOT DETECTED Final   Staphylococcus aureus NOT DETECTED NOT DETECTED Final   Streptococcus species NOT DETECTED NOT DETECTED Final   Streptococcus agalactiae NOT DETECTED NOT DETECTED Final   Streptococcus pneumoniae NOT DETECTED NOT DETECTED Final   Streptococcus pyogenes NOT DETECTED NOT DETECTED Final   Acinetobacter baumannii NOT DETECTED NOT DETECTED Final   Enterobacteriaceae species DETECTED (A) NOT DETECTED Final    Comment: CRITICAL RESULT CALLED TO, READ BACK BY AND VERIFIED WITH: T STONE,PHARMD AT 1016 02/22/16 BY L BENFIELD    Enterobacter cloacae complex NOT DETECTED NOT DETECTED Final   Escherichia coli DETECTED (A) NOT DETECTED Final    Comment: CRITICAL RESULT CALLED TO, READ BACK BY AND VERIFIED WITH: T STONE,PHARMD AT 1016 02/22/16 BY L BENFIELD    Klebsiella oxytoca NOT DETECTED NOT DETECTED Final   Klebsiella pneumoniae NOT DETECTED NOT DETECTED Final   Proteus species NOT DETECTED NOT DETECTED Final   Serratia marcescens NOT DETECTED NOT DETECTED Final   Carbapenem resistance NOT DETECTED NOT DETECTED Final   Haemophilus influenzae NOT DETECTED NOT DETECTED Final   Neisseria meningitidis NOT DETECTED NOT DETECTED Final   Pseudomonas aeruginosa NOT DETECTED NOT DETECTED Final   Candida albicans NOT DETECTED NOT DETECTED Final   Candida glabrata NOT DETECTED NOT DETECTED Final   Candida krusei NOT DETECTED NOT DETECTED Final   Candida parapsilosis NOT DETECTED NOT DETECTED Final   Candida tropicalis NOT DETECTED NOT DETECTED Final  Blood Culture (routine x 2)     Status: None (Preliminary result)   Collection Time: 02/06/2016  1:56 PM  Result Value Ref Range Status   Specimen Description BLOOD LEFT FOREARM  Final   Special Requests BOTTLES DRAWN AEROBIC AND ANAEROBIC  5CC  Final   Culture NO GROWTH 4 DAYS  Final   Report Status  PENDING  Incomplete  Urine culture     Status: Abnormal   Collection Time: 02/13/2016  2:02 PM  Result Value Ref Range Status   Specimen Description URINE, RANDOM  Final   Special Requests NONE  Final   Culture >=100,000 COLONIES/mL ESCHERICHIA COLI (A)  Final   Report Status 02/25/2016 FINAL  Final   Organism ID, Bacteria ESCHERICHIA COLI (  A)  Final      Susceptibility   Escherichia coli - MIC*    AMPICILLIN >=32 RESISTANT Resistant     CEFAZOLIN <=4 SENSITIVE Sensitive     CEFTRIAXONE <=1 SENSITIVE Sensitive     CIPROFLOXACIN <=0.25 SENSITIVE Sensitive     GENTAMICIN <=1 SENSITIVE Sensitive     IMIPENEM <=0.25 SENSITIVE Sensitive     NITROFURANTOIN <=16 SENSITIVE Sensitive     TRIMETH/SULFA <=20 SENSITIVE Sensitive     AMPICILLIN/SULBACTAM >=32 RESISTANT Resistant     PIP/TAZO <=4 SENSITIVE Sensitive     Extended ESBL NEGATIVE Sensitive     * >=100,000 COLONIES/mL ESCHERICHIA COLI  MRSA PCR Screening     Status: None   Collection Time: 02/17/2016 10:30 PM  Result Value Ref Range Status   MRSA by PCR NEGATIVE NEGATIVE Final    Comment:        The GeneXpert MRSA Assay (FDA approved for NASAL specimens only), is one component of a comprehensive MRSA colonization surveillance program. It is not intended to diagnose MRSA infection nor to guide or monitor treatment for MRSA infections.   Culture, blood (routine x 2)     Status: None (Preliminary result)   Collection Time: 02/22/16  4:47 PM  Result Value Ref Range Status   Specimen Description BLOOD LEFT ANTECUBITAL  Final   Special Requests BOTTLES DRAWN AEROBIC AND ANAEROBIC 5CC EA  Final   Culture NO GROWTH 3 DAYS  Final   Report Status PENDING  Incomplete  Culture, blood (routine x 2)     Status: None (Preliminary result)   Collection Time: 02/22/16  4:53 PM  Result Value Ref Range Status   Specimen Description BLOOD RIGHT HAND  Final   Special Requests BOTTLES DRAWN AEROBIC AND ANAEROBIC 10CC EA  Final   Culture NO  GROWTH 3 DAYS  Final   Report Status PENDING  Incomplete         Radiology Studies: No results found.      Scheduled Meds: . allopurinol  300 mg Oral QPM  . benztropine  0.5 mg Oral BID  . brimonidine  1 drop Both Eyes TID  . Carbidopa-Levodopa ER  1 tablet Oral Q1500  . Carbidopa-Levodopa ER  1.5 tablet Oral BID  . chlorhexidine  15 mL Mouth Rinse BID  . clopidogrel  75 mg Oral Daily  . dorzolamide  1 drop Both Eyes BID   And  . timolol  1 drop Both Eyes BID  . heparin  5,000 Units Subcutaneous Q8H  . insulin aspart  0-5 Units Subcutaneous QHS  . insulin aspart  0-9 Units Subcutaneous TID WC  . latanoprost  1 drop Both Eyes QHS  . mouth rinse  15 mL Mouth Rinse q12n4p  . pantoprazole  40 mg Oral Daily  . piperacillin-tazobactam (ZOSYN)  IV  3.375 g Intravenous Q8H  . QUEtiapine  50 mg Oral QHS  . sertraline  25 mg Oral QHS   Continuous Infusions:   LOS: 5 days       Mauricio Annett Gulaaniel Arrien, MD [facility] Pager [phone]  If 7PM-7AM, please contact night-coverage [email or web address] Password [email or web address] 02/26/2016, 8:05 AM

## 2016-02-26 NOTE — Progress Notes (Signed)
Speech Language Pathology Treatment: Dysphagia  Patient Details Name: Terrance Perez MRN: [id number] DOB: May 15, 1933 Today's Date: 02/26/2016 Time: [id number] SLP Time Calculation (min) (ACUTE ONLY): 14 min  Assessment / Plan / Recommendation Clinical Impression  SLP provided skilled observation and caregiver training during lunch meal, consisting of Dys 1 textures and nectar thick liquids. Pt needed Max cues for oral transit and swallow initiation, with throat clearing and coughing suggestive of penetration and/or aspiration. Wife does a good job throughout intake of monitoring bolus size, pacing, and swallow initiation. Performance overall appears consistent with MBS, at which time throat clearing was effective at expelling penetrates caused by pharyngeal residuals, and he remains afebrile. Recommend to continue with current diet.   HPI HPI: 82 y.o.malewith medical history significant for CAD, CABG, DM, dysphagia, glaucoma, gout, HLD, HTN, schizophrenia, Parkinson's, RLS presenting with cough, confusion, weakness, and gross hematuria. Symptoms progressive x2 weeks. Dx acute encephalopathy, UTI, possible pna. 11/23 CXR shows increased interstitial and airspace opacity in the left mid to lower lung, may represent infiltrate. Pt spends most of day at home in a lift chair; [facility]  [name] cares for her husband at home.  She thickens his liquids (consistency unclear); feeds him his meals; he sleeps in lift chair and she sleeps next to him in recliner in living room.        SLP Plan  Continue with current plan of care     Recommendations  Diet recommendations: Dysphagia 1 (puree);Nectar-thick liquid Liquids provided via: Straw Medication Administration: Crushed with puree Supervision: Staff to assist with self feeding;Full supervision/cueing for compensatory strategies Compensations: Slow rate;Small sips/bites;Effortful swallow;Multiple dry  swallows after each bite/sip;Follow solids with liquid Postural Changes and/or Swallow Maneuvers: Seated upright 90 degrees;Upright 30-60 min after meal                Oral Care Recommendations: Oral care BID Follow up Recommendations: Skilled Nursing facility Plan: Continue with current plan of care       GO                Maxcine Hamaiewonsky, Myleah Cavendish 02/26/2016, 3:18 PM  Maxcine HamLaura Paiewonsky, M.A. CCC-SLP [phone]

## 2016-02-27 DIAGNOSIS — A4151 Sepsis due to Escherichia coli [E. coli]: Principal | ICD-10-CM

## 2016-02-27 DIAGNOSIS — G20 Parkinson's disease: Secondary | ICD-10-CM

## 2016-02-27 DIAGNOSIS — G9340 Encephalopathy, unspecified: Secondary | ICD-10-CM

## 2016-02-27 DIAGNOSIS — F209 Schizophrenia, unspecified: Secondary | ICD-10-CM

## 2016-02-27 DIAGNOSIS — I5022 Chronic systolic (congestive) heart failure: Secondary | ICD-10-CM

## 2016-02-27 DIAGNOSIS — N179 Acute kidney failure, unspecified: Secondary | ICD-10-CM

## 2016-02-27 DIAGNOSIS — E785 Hyperlipidemia, unspecified: Secondary | ICD-10-CM

## 2016-02-27 LAB — BASIC METABOLIC PANEL
Anion gap: 11 (ref 5–15)
BUN: 42 mg/dL — AB (ref 6–20)
CHLORIDE: 116 mmol/L — AB (ref 101–111)
CO2: 23 mmol/L (ref 22–32)
CREATININE: 2.29 mg/dL — AB (ref 0.61–1.24)
Calcium: 8.5 mg/dL — ABNORMAL LOW (ref 8.9–10.3)
GFR calc Af Amer: 30 mL/min — ABNORMAL LOW (ref >60)
GFR, EST NON AFRICAN AMERICAN: 25 mL/min — AB (ref >60)
GLUCOSE: 117 mg/dL — AB (ref 65–99)
POTASSIUM: 3.2 mmol/L — AB (ref 3.5–5.1)
Sodium: 150 mmol/L — ABNORMAL HIGH (ref 135–145)

## 2016-02-27 LAB — CULTURE, BLOOD (ROUTINE X 2)
CULTURE: NO GROWTH
Culture: NO GROWTH

## 2016-02-27 LAB — CBC WITH DIFFERENTIAL/PLATELET
Basophils Absolute: 0 10*3/uL (ref 0.0–0.1)
Basophils Relative: 1 %
EOS ABS: 0.5 10*3/uL (ref 0.0–0.7)
EOS PCT: 6 %
HCT: 33.9 % — ABNORMAL LOW (ref 39.0–52.0)
Hemoglobin: 10.5 g/dL — ABNORMAL LOW (ref 13.0–17.0)
LYMPHS ABS: 2.3 10*3/uL (ref 0.7–4.0)
Lymphocytes Relative: 26 %
MCH: 30.3 pg (ref 26.0–34.0)
MCHC: 31 g/dL (ref 30.0–36.0)
MCV: 97.7 fL (ref 78.0–100.0)
MONOS PCT: 8 %
Monocytes Absolute: 0.7 10*3/uL (ref 0.1–1.0)
Neutro Abs: 5.1 10*3/uL (ref 1.7–7.7)
Neutrophils Relative %: 59 %
PLATELETS: 184 10*3/uL (ref 150–400)
RBC: 3.47 MIL/uL — AB (ref 4.22–5.81)
RDW: 17 % — ABNORMAL HIGH (ref 11.5–15.5)
WBC: 8.6 10*3/uL (ref 4.0–10.5)

## 2016-02-27 LAB — GLUCOSE, CAPILLARY
GLUCOSE-CAPILLARY: 111 mg/dL — AB (ref 65–99)
GLUCOSE-CAPILLARY: 168 mg/dL — AB (ref 65–99)
GLUCOSE-CAPILLARY: 137 mg/dL — AB (ref 65–99)
Glucose-Capillary: 160 mg/dL — ABNORMAL HIGH (ref 65–99)

## 2016-02-27 LAB — MAGNESIUM: Magnesium: 2.2 mg/dL (ref 1.7–2.4)

## 2016-02-27 MED ORDER — PANTOPRAZOLE SODIUM 40 MG PO PACK
40.0000 mg | PACK | Freq: Every day | ORAL | Status: DC
  Filled 2016-02-27: qty 20

## 2016-02-27 MED ORDER — METRONIDAZOLE 500 MG PO TABS
500.0000 mg | ORAL_TABLET | Freq: Three times a day (TID) | ORAL | Status: DC
  Administered 2016-02-27 – 2016-02-29 (×7): 500 mg via ORAL
  Filled 2016-02-27 (×7): qty 1

## 2016-02-27 MED ORDER — DEXTROSE 5 % IV SOLN
2.0000 g | INTRAVENOUS | Status: DC
  Administered 2016-02-27 – 2016-03-01 (×4): 2 g via INTRAVENOUS
  Filled 2016-02-27 (×4): qty 2

## 2016-02-27 MED ORDER — POTASSIUM CHLORIDE CRYS ER 20 MEQ PO TBCR
40.0000 meq | EXTENDED_RELEASE_TABLET | Freq: Once | ORAL | Status: AC
Start: 2016-02-27 — End: 2016-02-27
  Administered 2016-02-27: 40 meq via ORAL
  Filled 2016-02-27: qty 2

## 2016-02-27 NOTE — Progress Notes (Signed)
PROGRESS NOTE    Terrance SabalStephen R Perez  CZY:[id number] DOB: 31-Oct-1933 DOA: 02/24/2016 PCP: Minda MeoARONSON,RICHARD A, MD   Chief Complaint  Patient presents with  . Altered Mental Status    Brief Narrative:  HPI on 02/26/2016 by Dr. Shelly Flattenavid Merrell Terrance SabalStephen R Perez is a 82 y.o. male with medical history significant of CAD, CABG, DM, dysphagia, glaucoma, gout, HLD, HTN, schizophrenia, Parkinson's, RLS presenting with cough, confusion, weakness, and gross hematuria. History provided by EDP and patient's wife as patient is unable to provide reliable information at this time.   Patient symptoms started approximately 2 weeks ago with a cough. Became significantly worse over the last week. However, since wife states that over the last day it has somewhat improved. His been associated with generalized weakness over the last 4-5 days which is getting worse along with progressive intermittent confusion. At baseline patient has days from time to time where he is somewhat confused. No formal diagnosis of dementia per wife. Patient also had single episode of gross hematuria that wife describes as almost entirely blood. Occurred approximate 4 days ago. Denies any fevers, abdominal pain, dysuria, frequency, chest pain, palpitations, neck stiffness, headache, LOC, fall.  Interim history Admitted for UTI and pneumonia, questionable aspiration. Found to have Escherichia coli bacteremia. Patient is having worsening renal function however responding to diuresis.  Assessment & Plan   Sepsis due to Ecoli UTI/Bacteremia -Sepsis was present upon admission. Protocol was initiated. -WBC trending downward, currently 8.6 -Patient found to have Escherichia coli UTI bacteremia -Will transition from Zosyn to ceftriaxone  Left lower lobe pneumonia (present on admission).  -Question whether this is due to aspiration -Currently receiving Lasix by mouth -Continue supplemental oxygen maintain saturation above 92% -Continue  aspiration precautions -Transition to ceftriaxone and Flagyl  AKI on CKD, stage 3 -Creatinine currently 2.29  -Given worsening creatinine, will hold Lasix -Continue to monitor urine output -Metabolic acidosis resolved  Diabetes mellitus, type II -Continue insulin sliding scale and CABG monitoring  Chronic Systolic heart failure -Given worsening creatinine, will hold Lasix -Beta blocker as well as ACE inhibitor also held due to risk of hypotension and acute kidney injury -Continue to monitor intake and output, daily weight  Essential hypertension -Antihypertensives held due to risk of hypotension -Blood pressure currently well-controlled   Schizophrenia and parkinsons  -Patient more awake and alert, continue on carbidopa and levodopa. Quetiapine and sertraline only at night.   Metabolic encephalopathy -Appears to be improving -Multifactorial due to sepsis and medication induced, continue supportive care and psychotropic medications only at night.  Dysphagia -Speech therapy consultation appreciated -Continue dysphagia 1 diet with nectar thick liquids  GERD -Continue PPI  History of coronary artery disease/CVD -Continue Plavix  Gout -Continue allopurinol  Goals of care -Given Parkinson's disease as well as questionable encephalopathy/dementia, have consulted palliative care for goals of care discussion with family as well as CODE STATUS.  DVT Prophylaxis  heparin  Code Status: Full  Family Communication: None bedside  Disposition Plan: Admitted, continue to monitor and stepdown  Consultants Palliative care  Procedures  None  Antibiotics   Anti-infectives    Start     Dose/Rate Route Frequency Ordered Stop   02/27/16 1000  cefTRIAXone (ROCEPHIN) 2 g in dextrose 5 % 50 mL IVPB     2 g 100 mL/hr over 30 Minutes Intravenous Every 24 hours 02/27/16 0903     02/27/16 0915  metroNIDAZOLE (FLAGYL) tablet 500 mg     500 mg Oral Every 8 hours 02/27/16 0903  02/24/16 1400  piperacillin-tazobactam (ZOSYN) IVPB 3.375 g  Status:  Discontinued     3.375 g 12.5 mL/hr over 240 Minutes Intravenous Every 8 hours 02/24/16 1345 02/27/16 0902   02/22/16 1115  Ampicillin-Sulbactam (UNASYN) 3 g in sodium chloride 0.9 % 100 mL IVPB  Status:  Discontinued     3 g 200 mL/hr over 30 Minutes Intravenous Every 12 hours 02/22/16 1105 02/24/16 1316   02/22/16 1000  cefTRIAXone (ROCEPHIN) 1 g in dextrose 5 % 50 mL IVPB  Status:  Discontinued     1 g 100 mL/hr over 30 Minutes Intravenous Every 24 hours 02/14/2016 1658 02/22/16 0804   02/22/16 1000  azithromycin (ZITHROMAX) 500 mg in dextrose 5 % 250 mL IVPB  Status:  Discontinued     500 mg 250 mL/hr over 60 Minutes Intravenous Every 24 hours 02/02/2016 1658 02/22/16 0804   02/22/16 0945  piperacillin-tazobactam (ZOSYN) IVPB 3.375 g  Status:  Discontinued     3.375 g 12.5 mL/hr over 240 Minutes Intravenous Every 8 hours 02/22/16 0942 02/22/16 1105   02/02/2016 1515  azithromycin (ZITHROMAX) 500 mg in dextrose 5 % 250 mL IVPB     500 mg 250 mL/hr over 60 Minutes Intravenous  Once 02/26/2016 1501 02/07/2016 1621   02/28/2016 1330  cefTRIAXone (ROCEPHIN) 1 g in dextrose 5 % 50 mL IVPB  Status:  Discontinued     1 g 100 mL/hr over 30 Minutes Intravenous Every 24 hours 02/10/2016 1325 02/13/2016 1746      Subjective:   Terrance HareStephen Perez seen and examined today.  Patient denies pain at this time although continues to state his toe hurts. Patient does have history of Parkinson's and did present with metabolic encephalopathy. Per nursing staff, patient is more awake this morning and interactive.  Objective:   Vitals:   02/27/16 0245 02/27/16 0355 02/27/16 0723 02/27/16 1213  BP: 131/79  132/73 122/67  Pulse: 80  85 84  Resp: (!) 30  (!) 28 (!) 30  Temp: 97.9 F (36.6 C)  98.1 F (36.7 C) 98.2 F (36.8 C)  TempSrc: Axillary  Oral Oral  SpO2: 93%  96% 96%  Weight:  87.6 kg (193 lb 2 oz)    Height:        Intake/Output Summary  (Last 24 hours) at 02/27/16 1222 Last data filed at 02/27/16 1025  Gross per 24 hour  Intake              360 ml  Output              800 ml  Net             -440 ml   Filed Weights   02/25/16 0309 02/26/16 0417 02/27/16 0355  Weight: 90.8 kg (200 lb 2.8 oz) 90.2 kg (198 lb 13.7 oz) 87.6 kg (193 lb 2 oz)    Exam  General: Well developed, well nourished, NAD, appears stated age  HEENT: NCAT, mucous membranes moist.   Cardiovascular: S1 S2 auscultated, no rubs, murmurs or gallops. Regular rate and rhythm.  Respiratory: Diminished breath sounds, poor inspiratory effort, no wheezing or rhonchi appreciated  Abdomen: Soft, nontender, nondistended, + bowel sounds  Extremities: warm dry without cyanosis clubbing or edema  Neuro: Awake and alert, does follow instruction and commands. Unable to truly assess orientation at this time.   Data Reviewed: I have personally reviewed following labs and imaging studies  CBC:  Recent Labs Lab 02/23/16 0531 02/24/16 0423 02/25/16 0543 02/26/16  1610 02/27/16 0421  WBC 6.9 7.9 8.4 9.1 8.6  NEUTROABS 4.6 4.7 4.9 5.7 5.1  HGB 10.1* 10.4* 10.5* 10.4* 10.5*  HCT 32.0* 32.7* 32.7* 33.3* 33.9*  MCV 95.2 95.6 95.9 97.1 97.7  PLT 211 210 207 186 184   Basic Metabolic Panel:  Recent Labs Lab 02/23/16 0531 02/24/16 0423 02/25/16 0543 02/26/16 0520 02/27/16 0421  NA 138 140 144 148* 150*  K 3.7 3.1* 3.1* 3.0* 3.2*  CL 106 109 113* 116* 116*  CO2 21* 20* 20* 23 23  GLUCOSE 102* 122* 139* 135* 117*  BUN 34* 34* 37* 41* 42*  CREATININE 2.16* 1.92* 2.02* 2.14* 2.29*  CALCIUM 8.2* 8.1* 8.3* 8.4* 8.5*  MG  --   --   --   --  2.2   GFR: Estimated Creatinine Clearance: 27.6 mL/min (by C-G formula based on SCr of 2.29 mg/dL (H)). Liver Function Tests:  Recent Labs Lab 03-02-2016 1319  AST 77*  ALT 11*  ALKPHOS 75  BILITOT 1.6*  PROT 6.2*  ALBUMIN 2.8*   No results for input(s): LIPASE, AMYLASE in the last 168 hours. No results for  input(s): AMMONIA in the last 168 hours. Coagulation Profile: No results for input(s): INR, PROTIME in the last 168 hours. Cardiac Enzymes: No results for input(s): CKTOTAL, CKMB, CKMBINDEX, TROPONINI in the last 168 hours. BNP (last 3 results) No results for input(s): PROBNP in the last 8760 hours. HbA1C: No results for input(s): HGBA1C in the last 72 hours. CBG:  Recent Labs Lab 02/26/16 0721 02/26/16 1332 02/26/16 1614 02/26/16 2107 02/27/16 0723  GLUCAP 117* 120* 145* 141* 111*   Lipid Profile: No results for input(s): CHOL, HDL, LDLCALC, TRIG, CHOLHDL, LDLDIRECT in the last 72 hours. Thyroid Function Tests: No results for input(s): TSH, T4TOTAL, FREET4, T3FREE, THYROIDAB in the last 72 hours. Anemia Panel: No results for input(s): VITAMINB12, FOLATE, FERRITIN, TIBC, IRON, RETICCTPCT in the last 72 hours. Urine analysis:    Component Value Date/Time   COLORURINE AMBER (A) 03/02/2016 1402   APPEARANCEUR TURBID (A) 03/02/16 1402   LABSPEC 1.013 2016/03/02 1402   PHURINE 5.5 03-02-2016 1402   GLUCOSEU NEGATIVE 02-Mar-2016 1402   HGBUR LARGE (A) 03/02/2016 1402   BILIRUBINUR SMALL (A) March 02, 2016 1402   KETONESUR NEGATIVE Mar 02, 2016 1402   PROTEINUR NEGATIVE Mar 02, 2016 1402   UROBILINOGEN 1.0 03/24/2010 1929   NITRITE POSITIVE (A) 03/02/16 1402   LEUKOCYTESUR LARGE (A) 03/02/16 1402   Sepsis Labs: @LABRCNTIP (procalcitonin:4,lacticidven:4)  ) Recent Results (from the past 240 hour(s))  Blood Culture (routine x 2)     Status: Abnormal   Collection Time: 03/02/2016  1:19 PM  Result Value Ref Range Status   Specimen Description BLOOD RIGHT FOREARM  Final   Special Requests BOTTLES DRAWN AEROBIC AND ANAEROBIC  5CC  Final   Culture  Setup Time   Final    GRAM NEGATIVE RODS IN BOTH AEROBIC AND ANAEROBIC BOTTLES CRITICAL RESULT CALLED TO, READ BACK BY AND VERIFIED WITH: T STONE,PHARMD AT 1016 02/22/16 BY L BENFIELD    Culture ESCHERICHIA COLI (A)  Final   Report  Status 02/25/2016 FINAL  Final   Organism ID, Bacteria ESCHERICHIA COLI  Final      Susceptibility   Escherichia coli - MIC*    AMPICILLIN >=32 RESISTANT Resistant     CEFAZOLIN 16 SENSITIVE Sensitive     CEFEPIME <=1 SENSITIVE Sensitive     CEFTAZIDIME <=1 SENSITIVE Sensitive     CEFTRIAXONE <=1 SENSITIVE Sensitive  CIPROFLOXACIN <=0.25 SENSITIVE Sensitive     GENTAMICIN <=1 SENSITIVE Sensitive     IMIPENEM <=0.25 SENSITIVE Sensitive     TRIMETH/SULFA <=20 SENSITIVE Sensitive     AMPICILLIN/SULBACTAM >=32 RESISTANT Resistant     PIP/TAZO <=4 SENSITIVE Sensitive     Extended ESBL NEGATIVE Sensitive     * ESCHERICHIA COLI  Blood Culture ID Panel (Reflexed)     Status: Abnormal   Collection Time: 02/07/2016  1:19 PM  Result Value Ref Range Status   Enterococcus species NOT DETECTED NOT DETECTED Final   Listeria monocytogenes NOT DETECTED NOT DETECTED Final   Staphylococcus species NOT DETECTED NOT DETECTED Final   Staphylococcus aureus NOT DETECTED NOT DETECTED Final   Streptococcus species NOT DETECTED NOT DETECTED Final   Streptococcus agalactiae NOT DETECTED NOT DETECTED Final   Streptococcus pneumoniae NOT DETECTED NOT DETECTED Final   Streptococcus pyogenes NOT DETECTED NOT DETECTED Final   Acinetobacter baumannii NOT DETECTED NOT DETECTED Final   Enterobacteriaceae species DETECTED (A) NOT DETECTED Final    Comment: CRITICAL RESULT CALLED TO, READ BACK BY AND VERIFIED WITH: T STONE,PHARMD AT 1016 02/22/16 BY L BENFIELD    Enterobacter cloacae complex NOT DETECTED NOT DETECTED Final   Escherichia coli DETECTED (A) NOT DETECTED Final    Comment: CRITICAL RESULT CALLED TO, READ BACK BY AND VERIFIED WITH: T STONE,PHARMD AT 1016 02/22/16 BY L BENFIELD    Klebsiella oxytoca NOT DETECTED NOT DETECTED Final   Klebsiella pneumoniae NOT DETECTED NOT DETECTED Final   Proteus species NOT DETECTED NOT DETECTED Final   Serratia marcescens NOT DETECTED NOT DETECTED Final   Carbapenem  resistance NOT DETECTED NOT DETECTED Final   Haemophilus influenzae NOT DETECTED NOT DETECTED Final   Neisseria meningitidis NOT DETECTED NOT DETECTED Final   Pseudomonas aeruginosa NOT DETECTED NOT DETECTED Final   Candida albicans NOT DETECTED NOT DETECTED Final   Candida glabrata NOT DETECTED NOT DETECTED Final   Candida krusei NOT DETECTED NOT DETECTED Final   Candida parapsilosis NOT DETECTED NOT DETECTED Final   Candida tropicalis NOT DETECTED NOT DETECTED Final  Blood Culture (routine x 2)     Status: None   Collection Time: 02/25/2016  1:56 PM  Result Value Ref Range Status   Specimen Description BLOOD LEFT FOREARM  Final   Special Requests BOTTLES DRAWN AEROBIC AND ANAEROBIC  5CC  Final   Culture NO GROWTH 5 DAYS  Final   Report Status 02/26/2016 FINAL  Final  Urine culture     Status: Abnormal   Collection Time: 01/30/2016  2:02 PM  Result Value Ref Range Status   Specimen Description URINE, RANDOM  Final   Special Requests NONE  Final   Culture >=100,000 COLONIES/mL ESCHERICHIA COLI (A)  Final   Report Status 02/25/2016 FINAL  Final   Organism ID, Bacteria ESCHERICHIA COLI (A)  Final      Susceptibility   Escherichia coli - MIC*    AMPICILLIN >=32 RESISTANT Resistant     CEFAZOLIN <=4 SENSITIVE Sensitive     CEFTRIAXONE <=1 SENSITIVE Sensitive     CIPROFLOXACIN <=0.25 SENSITIVE Sensitive     GENTAMICIN <=1 SENSITIVE Sensitive     IMIPENEM <=0.25 SENSITIVE Sensitive     NITROFURANTOIN <=16 SENSITIVE Sensitive     TRIMETH/SULFA <=20 SENSITIVE Sensitive     AMPICILLIN/SULBACTAM >=32 RESISTANT Resistant     PIP/TAZO <=4 SENSITIVE Sensitive     Extended ESBL NEGATIVE Sensitive     * >=100,000 COLONIES/mL ESCHERICHIA COLI  MRSA PCR  Screening     Status: None   Collection Time: 03-02-16 10:30 PM  Result Value Ref Range Status   MRSA by PCR NEGATIVE NEGATIVE Final    Comment:        The GeneXpert MRSA Assay (FDA approved for NASAL specimens only), is one component of  a comprehensive MRSA colonization surveillance program. It is not intended to diagnose MRSA infection nor to guide or monitor treatment for MRSA infections.   Culture, blood (routine x 2)     Status: None (Preliminary result)   Collection Time: 02/22/16  4:47 PM  Result Value Ref Range Status   Specimen Description BLOOD LEFT ANTECUBITAL  Final   Special Requests BOTTLES DRAWN AEROBIC AND ANAEROBIC 5CC EA  Final   Culture NO GROWTH 4 DAYS  Final   Report Status PENDING  Incomplete  Culture, blood (routine x 2)     Status: None (Preliminary result)   Collection Time: 02/22/16  4:53 PM  Result Value Ref Range Status   Specimen Description BLOOD RIGHT HAND  Final   Special Requests BOTTLES DRAWN AEROBIC AND ANAEROBIC 10CC EA  Final   Culture NO GROWTH 4 DAYS  Final   Report Status PENDING  Incomplete      Radiology Studies: No results found.   Scheduled Meds: . allopurinol  300 mg Oral QPM  . benztropine  0.5 mg Oral BID  . brimonidine  1 drop Both Eyes TID  . Carbidopa-Levodopa ER  1 tablet Oral Q1500  . Carbidopa-Levodopa ER  1.5 tablet Oral BID  . cefTRIAXone (ROCEPHIN)  IV  2 g Intravenous Q24H  . chlorhexidine  15 mL Mouth Rinse BID  . clopidogrel  75 mg Oral Daily  . dorzolamide  1 drop Both Eyes BID   And  . timolol  1 drop Both Eyes BID  . furosemide  40 mg Oral Daily  . heparin  5,000 Units Subcutaneous Q8H  . insulin aspart  0-5 Units Subcutaneous QHS  . insulin aspart  0-9 Units Subcutaneous TID WC  . latanoprost  1 drop Both Eyes QHS  . mouth rinse  15 mL Mouth Rinse q12n4p  . metroNIDAZOLE  500 mg Oral Q8H  . [START ON 02/28/2016] pantoprazole sodium  40 mg Oral Daily  . QUEtiapine  50 mg Oral QHS  . sertraline  25 mg Oral QHS   Continuous Infusions:   LOS: 6 days   Time Spent in minutes   30 minutes  Kaidyn Hernandes D.O. on 02/27/2016 at 12:22 PM  Between 7am to 7pm - Pager - [phone]  After 7pm go to [email or web address] - password [email or web address]  And  look for the night coverage person covering for me after hours  [facility] Office  [phone]

## 2016-02-27 NOTE — Care Management Important Message (Signed)
Important Message  Patient Details  Name: Terrance Perez MRN: [id number] Date of Birth: 10/27/1933   Medicare Important Message Given:  Yes    Kyla BalzarineShealy, Oluwaseyi Raffel Abena 02/27/2016, 10:41 AM

## 2016-02-27 NOTE — NC FL2 (Signed)
[place] MEDICAID FL2 LEVEL OF CARE SCREENING TOOL     IDENTIFICATION  Patient Name: Terrance Perez Birthdate: 01/26/1934 Sex: male Admission Date (Current Location): 02/28/2016  [facility] and [place] Number:  Producer, television/film/video and Address:  [facility], [address], [place], [place] [address]      Provider Number: [id number]  Attending Physician Name and Address:  Edsel Petrin, DO  Relative Name and Phone Number:       Current Level of Care: Hospital Recommended Level of Care: Skilled Nursing Facility Prior Approval Number:    Date Approved/Denied:   PASRR Number: [id number]  Discharge Plan: SNF    Current Diagnoses: Patient Active Problem List   Diagnosis Date Noted  . Sepsis (HCC) 02/28/16  . Encephalopathy acute 2016-02-28  . Acute encephalopathy 2016/02/28  . Systolic congestive heart failure (HCC) February 28, 2016  . AKI (acute kidney injury) (HCC) February 28, 2016  . Schizophrenia (HCC) 02/28/16  . SIRS (systemic inflammatory response syndrome) (HCC)   . RLS (restless legs syndrome) 02/04/2016  . Cardiomyopathy, ischemic 10/26/2015  . Chronic renal insufficiency, stage III (moderate) 10/26/2015  . Pain in the chest   . Chest pain 07/18/2014  . Dysphagia, pharyngoesophageal phase 06/08/2014  . Pneumonia 08/04/2013  . Chronic diastolic CHF (congestive heart failure) (HCC) 05/11/2013  . Parkinsonism (HCC) 12/02/2012  . Abnormality of gait 12/02/2012  . Syncope 12/12/2010  . Diabetes mellitus type II, non insulin dependent (HCC) 09/21/2010  . Hx of CABG   . Hypertension   . Hyperlipidemia   . CVD (cerebrovascular disease)   . Diastolic dysfunction     Orientation RESPIRATION BLADDER Height & Weight     Self  O2 (Nasal Canula 2 L) Continent, Indwelling catheter Weight: 193 lb 2 oz (87.6 kg) Height:  5\' 7"  (170.2 cm)  BEHAVIORAL SYMPTOMS/MOOD NEUROLOGICAL BOWEL NUTRITION STATUS   (None)  (None) Continent Diet (DYS 1,  Fluid nectar thick: Pt needs assistance with feeding; crush meds in puree)  AMBULATORY STATUS COMMUNICATION OF NEEDS Skin   Extensive Assist Verbally Other (Comment) (MASD, Open/dehisced wound/incision left and right lower leg, )                       Personal Care Assistance Level of Assistance              Functional Limitations Info  Sight, Hearing, Speech     Speech Info: Impaired    SPECIAL CARE FACTORS FREQUENCY  PT (By licensed PT), Blood pressure, Diabetic urine testing, Speech therapy     PT Frequency: 5 x week       Speech Therapy Frequency: 5 x week      Contractures Contractures Info: Not present    Additional Factors Info  Psychotropic Code Status Info: Full Allergies Info: NKDA Psychotropic Info: Schizophrenia: Cogentin 0.5 mg PO BID, Seroquel 50 mg PO QHS, Zoloft 25 mg PO QHS         Current Medications (02/27/2016):  This is the current hospital active medication list Current Facility-Administered Medications  Medication Dose Route Frequency Provider Last Rate Last Dose  . acetaminophen (TYLENOL) tablet 650 mg  650 mg Oral Q6H PRN Ozella Rocks, MD       Or  . acetaminophen (TYLENOL) suppository 650 mg  650 mg Rectal Q6H PRN Ozella Rocks, MD      . allopurinol (ZYLOPRIM) tablet 300 mg  300 mg Oral QPM Ozella Rocks, MD   300  mg at 02/27/16 1800  . benztropine (COGENTIN) tablet 0.5 mg  0.5 mg Oral BID Ozella Rocksavid J Merrell, MD   0.5 mg at 02/27/16 1000  . brimonidine (ALPHAGAN) 0.2 % ophthalmic solution 1 drop  1 drop Both Eyes TID Ozella Rocksavid J Merrell, MD   1 drop at 02/27/16 1608  . Carbidopa-Levodopa ER (SINEMET CR) 25-100 MG tablet controlled release 1 tablet  1 tablet Oral Q1500 Ozella Rocksavid J Merrell, MD   1 tablet at 02/27/16 1200  . Carbidopa-Levodopa ER (SINEMET CR) 25-100 MG tablet controlled release 1.5 tablet  1.5 tablet Oral BID Ozella Rocksavid J Merrell, MD   1.5 tablet at 02/27/16 1609  . cefTRIAXone (ROCEPHIN) 2 g in dextrose 5 % 50 mL IVPB  2 g  Intravenous Q24H Maryann Mikhail, DO   2 g at 02/27/16 1018  . chlorhexidine (PERIDEX) 0.12 % solution 15 mL  15 mL Mouth Rinse BID Coralie KeensMauricio Daniel Arrien, MD   15 mL at 02/27/16 1000  . clopidogrel (PLAVIX) tablet 75 mg  75 mg Oral Daily Ozella Rocksavid J Merrell, MD   75 mg at 02/27/16 1013  . dorzolamide (TRUSOPT) 2 % ophthalmic solution 1 drop  1 drop Both Eyes BID Ozella Rocksavid J Merrell, MD   1 drop at 02/27/16 1014   And  . timolol (TIMOPTIC) 0.5 % ophthalmic solution 1 drop  1 drop Both Eyes BID Ozella Rocksavid J Merrell, MD   1 drop at 02/27/16 1014  . furosemide (LASIX) tablet 40 mg  40 mg Oral Daily Mauricio Annett Gulaaniel Arrien, MD   40 mg at 02/27/16 1013  . heparin injection 5,000 Units  5,000 Units Subcutaneous Q8H Ozella Rocksavid J Merrell, MD   5,000 Units at 02/27/16 1608  . insulin aspart (novoLOG) injection 0-5 Units  0-5 Units Subcutaneous QHS Ozella Rocksavid J Merrell, MD      . insulin aspart (novoLOG) injection 0-9 Units  0-9 Units Subcutaneous TID WC Ozella Rocksavid J Merrell, MD   2 Units at 02/27/16 1200  . latanoprost (XALATAN) 0.005 % ophthalmic solution 1 drop  1 drop Both Eyes QHS Ozella Rocksavid J Merrell, MD   1 drop at 02/26/16 2041  . MEDLINE mouth rinse  15 mL Mouth Rinse q12n4p Mauricio Annett Gulaaniel Arrien, MD   15 mL at 02/27/16 1600  . metroNIDAZOLE (FLAGYL) tablet 500 mg  500 mg Oral Q8H Maryann Mikhail, DO   500 mg at 02/27/16 1607  . morphine 2 MG/ML injection 2 mg  2 mg Intravenous Q3H PRN Roma KayserKatherine P Schorr, NP   2 mg at 02/26/16 1150  . ondansetron (ZOFRAN) tablet 4 mg  4 mg Oral Q6H PRN Ozella Rocksavid J Merrell, MD       Or  . ondansetron [facility](ZOFRAN) injection 4 mg  4 mg Intravenous Q6H PRN Ozella Rocksavid J Merrell, MD      . oxyCODONE (Oxy IR/ROXICODONE) immediate release tablet 5 mg  5 mg Oral Q4H PRN Ozella Rocksavid J Merrell, MD      . Melene Muller[START ON 02/28/2016] pantoprazole sodium (PROTONIX) 40 mg/20 mL oral suspension 40 mg  40 mg Oral Daily Maryann Mikhail, DO      . QUEtiapine (SEROQUEL) tablet 50 mg  50 mg Oral QHS Ozella Rocksavid J Merrell, MD   50 mg at 02/26/16  2036  . sertraline (ZOLOFT) tablet 25 mg  25 mg Oral QHS Ozella Rocksavid J Merrell, MD   25 mg at 02/26/16 2035     Discharge Medications: Please see discharge summary for a list of discharge medications.  Relevant Imaging Results:  Relevant Lab  Results:   Additional Information SS#: [id number]  Margarito LinerSarah C Savina Olshefski, LCSW

## 2016-02-27 NOTE — Clinical Social Work Placement (Signed)
   CLINICAL SOCIAL WORK PLACEMENT  NOTE  Date:  02/27/2016  Patient Details  Name: Terrance Perez MRN: [id number] Date of Birth: 1934-01-17  Clinical Social Work is seeking post-discharge placement for this patient at the Skilled  Nursing Facility level of care (*CSW will initial, date and re-position this form in  chart as items are completed):  Yes   Patient/family provided with [facility] Clinical Social Work Department's list of facilities offering this level of care within the geographic area requested by the patient (or if unable, by the patient's family).  Yes   Patient/family informed of their freedom to choose among providers that offer the needed level of care, that participate in Medicare, Medicaid or managed care program needed by the patient, have an available bed and are willing to accept the patient.  Yes   Patient/family informed of [facility]'s ownership interest in [facility] and [facility], as well as of the fact that they are under no obligation to receive care at these facilities.  PASRR submitted to EDS on 02/27/16     PASRR number received on       Existing PASRR number confirmed on 02/27/16     FL2 transmitted to all facilities in geographic area requested by pt/family on 02/27/16     FL2 transmitted to all facilities within larger geographic area on       Patient informed that his/her managed care company has contracts with or will negotiate with certain facilities, including the following:            Patient/family informed of bed offers received.  Patient chooses bed at       Physician recommends and patient chooses bed at      Patient to be transferred to   on  .  Patient to be transferred to facility by       Patient family notified on   of transfer.  Name of family member notified:        PHYSICIAN Please sign FL2     Additional Comment:    _______________________________________________ Margarito LinerSarah C Tavon Corriher,  LCSW 02/27/2016, 5:12 PM

## 2016-02-27 NOTE — Clinical Social Work Note (Signed)
Clinical Social Work Assessment  Patient Details  Name: Terrance Perez MRN: [id number] Date of Birth: 11/01/1933  Date of referral:  02/27/16               Reason for consult:  Facility Placement, Discharge Planning                Permission sought to share information with:  Facility Sport and exercise psychologist, Family Supports Permission granted to share information::  Yes, Verbal Permission Granted  Name::     Dayne Chait  Agency::  SNF's  Relationship::  Wife  Contact Information:  [phone]  Housing/Transportation Living arrangements for the past 2 months:  [place] of Information:  Medical Team, Spouse Patient Interpreter Needed:  None Criminal Activity/Legal Involvement Pertinent to Current Situation/Hospitalization:  No - Comment as needed Significant Relationships:  Adult Children, Spouse Lives with:  Spouse Do you feel safe going back to the place where you live?  Yes Need for family participation in patient care:  Yes (Comment)  Care giving concerns:  PT recommending SNF once medically stable for discharge.   Social Worker assessment / plan:  CSW met with patient. Wife at bedside. Patient only oriented to self and asleep. CSW introduced role and explained that PT recommendations would be discussed. Patient's wife agreeable to SNF only if patient is not doing better by the time of discharge. Patient's wife states that as long as he can take 2-3 steps, patient can go home with her. Patient's wife gave permission to fax information to SNF's in the event he is not doing better. Discussed preferences. No further concerns. CSW encouraged patient's wife to contact CSW as needed. CSW will continue to follow patient and his wife and will facilitate discharge to SNF, if patient's wife agreeable, once medically stable.  Employment status:  Retired Nurse, adult PT Recommendations:  [place] / Referral to  community resources:  [place]  Patient/Family's Response to care:  Patient not fully oriented. Patient's wife agreeable to SNF placement. Patient's family supportive and involved in patient's care. Patient's wife appreciated social work intervention.  Patient/Family's Understanding of and Emotional Response to Diagnosis, Current Treatment, and Prognosis:  Patient not fully oriented. Patient's wife understands and is agreeable to the discharge plan as of right now but hopes patient will improve, if only a little bit, to the point where she can take him home. Patient's wife appears happy with hospital care.  Emotional Assessment Appearance:  Appears stated age Attitude/Demeanor/Rapport:  Unable to Assess Affect (typically observed):  Unable to Assess Orientation:  Oriented to Self Alcohol / Substance use:  Never Used Psych involvement (Current and /or in the community):  No (Comment)  Discharge Needs  Concerns to be addressed:  Care Coordination Readmission within the last 30 days:  No Current discharge risk:  Cognitively Impaired, Dependent with Mobility Barriers to Discharge:  Continued Medical Work up   Candie Chroman, LCSW 02/27/2016, 5:07 PM

## 2016-02-27 NOTE — Evaluation (Signed)
Physical Therapy Evaluation Patient Details Name: Terrance SabalStephen R Perez MRN: [id number] DOB: Jul 13, 1933 Today's Date: 02/27/2016   History of Present Illness  pt presents with Encephalopathy and possible PNA.  pt with a hx of Schizophrenia, Parkinson's, RLS, HTN, Gout, Glaucoma, Dysphagia, DM, CABG, and CAD.    Clinical Impression  Pt with only minimal automatic verbalizations during session, but does participate once PT initiated mobility.  Pt very pleasant throughout session, but only oriented to name and keeps eyes closed most of the time.  Noted in chart that pt's wife is primary caregiver for pt, but that she still works.  Feel pt is not safe to return to home with only wife's support.  Pt will need higher level of care at SNF to maximize independence and decrease overall burden of care.  Will continue to follow.      Follow Up Recommendations SNF    Equipment Recommendations  None recommended by PT    Recommendations for Other Services       Precautions / Restrictions Precautions Precautions: Fall Restrictions Weight Bearing Restrictions: No      Mobility  Bed Mobility               General bed mobility comments: pt in recliner.  Transfers Overall transfer level: Needs assistance Equipment used: 2 person hand held assist Transfers: Sit to/from Stand Sit to Stand: Max assist;+2 physical assistance         General transfer comment: pt needs Bil feet blocked and MaxA for power up to standing and bringing weight over BOS.  pt remains quite flexed posture in standing.  Repeated coming to standing x2.    Ambulation/Gait                Stairs            Wheelchair Mobility    Modified Rankin (Stroke Patients Only)       Balance Overall balance assessment: Needs assistance Sitting-balance support: Bilateral upper extremity supported;Feet supported Sitting balance-Leahy Scale: Poor   Postural control: Posterior lean Standing balance support:  Bilateral upper extremity supported;During functional activity Standing balance-Leahy Scale: Poor                               Pertinent Vitals/Pain Pain Assessment: Faces Faces Pain Scale: No hurt    Home Living Family/patient expects to be discharged to:: Private residence Living Arrangements: Spouse/significant other Available Help at Discharge: Family             Additional Comments: Per chart pt lives with his wife who is his caregiver, but wife does still work.      Prior Function Level of Independence: Needs assistance   Gait / Transfers Assistance Needed: Unclear, but per chart pt has a lift chair he sleeps in.  ADL's / Homemaking Assistance Needed: Wife A with all ADLs and homemaking per chart.        Hand Dominance        Extremity/Trunk Assessment   Upper Extremity Assessment: Generalized weakness;Difficult to assess due to impaired cognition (UE edema Bil)           Lower Extremity Assessment: Generalized weakness;Difficult to assess due to impaired cognition (Overall decreased ROM and increased tone.  )      Cervical / Trunk Assessment: Kyphotic  Communication   Communication: Expressive difficulties (Difficult to understand.)  Cognition Arousal/Alertness: Awake/alert (pt keeps his eyes closed, but will open on  command.) Behavior During Therapy: Flat affect Overall Cognitive Status: No family/caregiver present to determine baseline cognitive functioning                 General Comments: pt oriented to name only.  pt participates with mobility once initiated by PT, but does not attempt to follow verbal direction.  pt talks to PT during more automatic conversation "Nice to meet you." after PT introduced self.  pt did not initiate any verbalizations.      General Comments      Exercises     Assessment/Plan    PT Assessment Patient needs continued PT services  PT Problem List Decreased strength;Decreased range of  motion;Decreased activity tolerance;Decreased balance;Decreased mobility;Decreased coordination;Decreased cognition;Decreased knowledge of use of DME;Decreased safety awareness          PT Treatment Interventions DME instruction;Gait training;Functional mobility training;Therapeutic activities;Therapeutic exercise;Balance training;Neuromuscular re-education;Cognitive remediation;Patient/family education    PT Goals (Current goals can be found in the Care Plan section)  Acute Rehab PT Goals Patient Stated Goal: pt unable to state. PT Goal Formulation: Patient unable to participate in goal setting Time For Goal Achievement: 03/12/16 Potential to Achieve Goals: Fair    Frequency Min 2X/week   Barriers to discharge Decreased caregiver support Wife has been caregiver at home and wife works    Co-evaluation               End of Session Equipment Utilized During Treatment: Gait belt Activity Tolerance: Patient tolerated treatment well Patient left: in chair;with call bell/phone within reach Nurse Communication: Mobility status         Time: [id number] PT Time Calculation (min) (ACUTE ONLY): 19 min   Charges:   PT Evaluation $PT Eval Moderate Complexity: 1 Procedure     PT G CodesSunny Schlein:        Raeven Pint F Dmarion Perfect, PT  [phone] 02/27/2016, 1:33 PM

## 2016-02-28 DIAGNOSIS — R6510 Systemic inflammatory response syndrome (SIRS) of non-infectious origin without acute organ dysfunction: Secondary | ICD-10-CM

## 2016-02-28 LAB — BASIC METABOLIC PANEL
ANION GAP: 12 (ref 5–15)
BUN: 46 mg/dL — ABNORMAL HIGH (ref 6–20)
CHLORIDE: 117 mmol/L — AB (ref 101–111)
CO2: 22 mmol/L (ref 22–32)
Calcium: 8.5 mg/dL — ABNORMAL LOW (ref 8.9–10.3)
Creatinine, Ser: 2.44 mg/dL — ABNORMAL HIGH (ref 0.61–1.24)
GFR calc Af Amer: 27 mL/min — ABNORMAL LOW (ref >60)
GFR, EST NON AFRICAN AMERICAN: 24 mL/min — AB (ref >60)
Glucose, Bld: 134 mg/dL — ABNORMAL HIGH (ref 65–99)
POTASSIUM: 3.1 mmol/L — AB (ref 3.5–5.1)
SODIUM: 151 mmol/L — AB (ref 135–145)

## 2016-02-28 LAB — GLUCOSE, CAPILLARY
GLUCOSE-CAPILLARY: 114 mg/dL — AB (ref 65–99)
GLUCOSE-CAPILLARY: 105 mg/dL — AB (ref 65–99)
GLUCOSE-CAPILLARY: 145 mg/dL — AB (ref 65–99)
Glucose-Capillary: 167 mg/dL — ABNORMAL HIGH (ref 65–99)
Glucose-Capillary: 163 mg/dL — ABNORMAL HIGH (ref 65–99)

## 2016-02-28 LAB — CBC
HCT: 34.2 % — ABNORMAL LOW (ref 39.0–52.0)
HEMOGLOBIN: 10.6 g/dL — AB (ref 13.0–17.0)
MCH: 30.3 pg (ref 26.0–34.0)
MCHC: 31 g/dL (ref 30.0–36.0)
MCV: 97.7 fL (ref 78.0–100.0)
PLATELETS: 192 10*3/uL (ref 150–400)
RBC: 3.5 MIL/uL — AB (ref 4.22–5.81)
RDW: 16.9 % — ABNORMAL HIGH (ref 11.5–15.5)
WBC: 8.5 10*3/uL (ref 4.0–10.5)

## 2016-02-28 MED ORDER — BIOTENE DRY MOUTH MT LIQD: 15.0000 mL | OROMUCOSAL | Status: DC | PRN

## 2016-02-28 MED ORDER — POTASSIUM CHLORIDE 20 MEQ/15ML (10%) PO SOLN
40.0000 meq | Freq: Every day | ORAL | Status: DC
  Administered 2016-02-28: 40 meq via ORAL
  Filled 2016-02-28: qty 30

## 2016-02-28 MED ORDER — SENNOSIDES 8.8 MG/5ML PO SYRP
5.0000 mL | ORAL_SOLUTION | Freq: Every day | ORAL | Status: DC
  Administered 2016-02-28: 5 mL via ORAL
  Filled 2016-02-28: qty 5

## 2016-02-28 MED ORDER — HYDROMORPHONE HCL 1 MG/ML IJ SOLN
0.3000 mg | INTRAMUSCULAR | Status: DC | PRN
  Administered 2016-02-29 – 2016-03-01 (×3): 0.3 mg via INTRAVENOUS
  Filled 2016-02-28 (×3): qty 1

## 2016-02-28 NOTE — Clinical Social Work Note (Signed)
CSW met with patient's wife and provided list of bed offers. First preference is [facility].   In regards to disposition planning, per palliative, home hospice is not an option for patient because patient's wife works in the mornings and would not have any support at home.   , CSW [phone]  

## 2016-02-28 NOTE — Progress Notes (Signed)
PROGRESS NOTE    Terrance Perez  ZOX:[id number] DOB: 1933/09/17 DOA: 01/30/2016 PCP: Minda Meo, MD   Chief Complaint  Patient presents with  . Altered Mental Status    Brief Narrative:  HPI on 02/20/2016 by Dr. Shelly Flatten Terrance Perez is a 82 y.o. male with medical history significant of CAD, CABG, DM, dysphagia, glaucoma, gout, HLD, HTN, schizophrenia, Parkinson's, RLS presenting with cough, confusion, weakness, and gross hematuria. History provided by EDP and patient's wife as patient is unable to provide reliable information at this time.   Patient symptoms started approximately 2 weeks ago with a cough. Became significantly worse over the last week. However, since wife states that over the last day it has somewhat improved. His been associated with generalized weakness over the last 4-5 days which is getting worse along with progressive intermittent confusion. At baseline patient has days from time to time where he is somewhat confused. No formal diagnosis of dementia per wife. Patient also had single episode of gross hematuria that wife describes as almost entirely blood. Occurred approximate 4 days ago. Denies any fevers, abdominal pain, dysuria, frequency, chest pain, palpitations, neck stiffness, headache, LOC, fall.  Interim history Admitted for UTI and pneumonia, questionable aspiration. Found to have Escherichia coli bacteremia. Patient is having worsening renal function however responding to diuresis.  Assessment & Plan   Sepsis due to Ecoli UTI/Bacteremia -Sepsis was present upon admission. Protocol was initiated. -WBC trending downward, currently 8.5 -Patient found to have Escherichia coli UTI bacteremia -Transitioned from Zosyn to ceftriaxone  Left lower lobe pneumonia (present on admission)  -Question whether this is due to aspiration -Currently receiving Lasix by mouth -Continue supplemental oxygen maintain saturation above 92% -Continue  aspiration precautions -Transitioned to ceftriaxone and Flagyl  AKI on CKD, stage 3 -Creatinine currently 2.44  -Given worsening creatinine, will hold Lasix -Continue to monitor urine output- UOP over past 24hrs 950cc -Metabolic acidosis resolved  Diabetes mellitus, type II -Continue insulin sliding scale and CABG monitoring  Chronic Systolic heart failure -Given worsening creatinine, will hold Lasix -Beta blocker as well as ACE inhibitor also held due to risk of hypotension and acute kidney injury -Continue to monitor intake and output, daily weight  Essential hypertension -Antihypertensives held due to risk of hypotension -Blood pressure currently well-controlled   Schizophrenia and parkinsons  -Patient more awake and alert, continue on carbidopa and levodopa. Quetiapine and sertraline only at night.   Metabolic encephalopathy -Appears to be improving -Multifactorial due to sepsis and medication induced, continue supportive care and psychotropic medications only at night.  Dysphagia -Speech therapy consultation appreciated -Continue dysphagia 1 diet with nectar thick liquids  GERD -Continue PPI  History of coronary artery disease/CVD -Continue Plavix  Gout -Continue allopurinol  Goals of care -Given Parkinson's disease as well as questionable encephalopathy/dementia, have consulted palliative care for goals of care discussion with family as well as CODE STATUS.  DVT Prophylaxis  heparin  Code Status: Full  Family Communication: None bedside  Disposition Plan: Admitted, continue to monitor and stepdown  Consultants Palliative care  Procedures  None  Antibiotics   Anti-infectives    Start     Dose/Rate Route Frequency Ordered Stop   02/27/16 1000  cefTRIAXone (ROCEPHIN) 2 g in dextrose 5 % 50 mL IVPB     2 g 100 mL/hr over 30 Minutes Intravenous Every 24 hours 02/27/16 0903     02/27/16 0915  metroNIDAZOLE (FLAGYL) tablet 500 mg     500 mg Oral  Every 8 hours 02/27/16 0903     02/24/16 1400  piperacillin-tazobactam (ZOSYN) IVPB 3.375 g  Status:  Discontinued     3.375 g 12.5 mL/hr over 240 Minutes Intravenous Every 8 hours 02/24/16 1345 02/27/16 0902   02/22/16 1115  Ampicillin-Sulbactam (UNASYN) 3 g in sodium chloride 0.9 % 100 mL IVPB  Status:  Discontinued     3 g 200 mL/hr over 30 Minutes Intravenous Every 12 hours 02/22/16 1105 02/24/16 1316   02/22/16 1000  cefTRIAXone (ROCEPHIN) 1 g in dextrose 5 % 50 mL IVPB  Status:  Discontinued     1 g 100 mL/hr over 30 Minutes Intravenous Every 24 hours 03/10/2016 1658 02/22/16 0804   02/22/16 1000  azithromycin (ZITHROMAX) 500 mg in dextrose 5 % 250 mL IVPB  Status:  Discontinued     500 mg 250 mL/hr over 60 Minutes Intravenous Every 24 hours 03/16/2016 1658 02/22/16 0804   02/22/16 0945  piperacillin-tazobactam (ZOSYN) IVPB 3.375 g  Status:  Discontinued     3.375 g 12.5 mL/hr over 240 Minutes Intravenous Every 8 hours 02/22/16 0942 02/22/16 1105   March 01, 2016 1515  azithromycin (ZITHROMAX) 500 mg in dextrose 5 % 250 mL IVPB     500 mg 250 mL/hr over 60 Minutes Intravenous  Once Mar 01, 2016 1501 01-Mar-2016 1621   03/19/2016 1330  cefTRIAXone (ROCEPHIN) 1 g in dextrose 5 % 50 mL IVPB  Status:  Discontinued     1 g 100 mL/hr over 30 Minutes Intravenous Every 24 hours 03/27/2016 1325 03/21/2016 1746      Subjective:   Terrance Perez seen and examined today.  Patient sleepy this morning, not very interactive.   Objective:   Vitals:   02/28/16 0225 02/28/16 0320 02/28/16 0323 02/28/16 0903  BP: 137/73 131/69  128/66  Pulse: 98 91  81  Resp: (!) 42 (!) 40  (!) 24  Temp:   99.8 F (37.7 C) 98.8 F (37.1 C)  TempSrc:   Axillary Axillary  SpO2: 93% 96%  98%  Weight:   88.7 kg (195 lb 8.8 oz)   Height:        Intake/Output Summary (Last 24 hours) at 02/28/16 1100 Last data filed at 02/28/16 0600  Gross per 24 hour  Intake               60 ml  Output              800 ml  Net              -740 ml   Filed Weights   02/26/16 0417 02/27/16 0355 02/28/16 0323  Weight: 90.2 kg (198 lb 13.7 oz) 87.6 kg (193 lb 2 oz) 88.7 kg (195 lb 8.8 oz)    Exam  General: Well developed, well nourished, NAD  HEENT: NCAT, mucous membranes moist.   Cardiovascular: S1 S2 auscultated, RRR  Respiratory: Diminished breath sounds, poor inspiratory effort, no wheezing or rhonchi appreciated  Abdomen: Soft, nontender, nondistended, + bowel sounds  Extremities: warm dry without cyanosis clubbing or edema  Data Reviewed: I have personally reviewed following labs and imaging studies  CBC:  Recent Labs Lab 02/23/16 0531 02/24/16 0423 02/25/16 0543 02/26/16 0520 02/27/16 0421 02/28/16 0326  WBC 6.9 7.9 8.4 9.1 8.6 8.5  NEUTROABS 4.6 4.7 4.9 5.7 5.1  --   HGB 10.1* 10.4* 10.5* 10.4* 10.5* 10.6*  HCT 32.0* 32.7* 32.7* 33.3* 33.9* 34.2*  MCV 95.2 95.6 95.9 97.1 97.7 97.7  PLT 211 210  207 186 184 192   Basic Metabolic Panel:  Recent Labs Lab 02/24/16 0423 02/25/16 0543 02/26/16 0520 02/27/16 0421 02/28/16 0326  NA 140 144 148* 150* 151*  K 3.1* 3.1* 3.0* 3.2* 3.1*  CL 109 113* 116* 116* 117*  CO2 20* 20* 23 23 22   GLUCOSE 122* 139* 135* 117* 134*  BUN 34* 37* 41* 42* 46*  CREATININE 1.92* 2.02* 2.14* 2.29* 2.44*  CALCIUM 8.1* 8.3* 8.4* 8.5* 8.5*  MG  --   --   --  2.2  --    GFR: Estimated Creatinine Clearance: 26.1 mL/min (by C-G formula based on SCr of 2.44 mg/dL (H)). Liver Function Tests:  Recent Labs Lab 2015-12-06 1319  AST 77*  ALT 11*  ALKPHOS 75  BILITOT 1.6*  PROT 6.2*  ALBUMIN 2.8*   No results for input(s): LIPASE, AMYLASE in the last 168 hours. No results for input(s): AMMONIA in the last 168 hours. Coagulation Profile: No results for input(s): INR, PROTIME in the last 168 hours. Cardiac Enzymes: No results for input(s): CKTOTAL, CKMB, CKMBINDEX, TROPONINI in the last 168 hours. BNP (last 3 results) No results for input(s): PROBNP in the last 8760  hours. HbA1C: No results for input(s): HGBA1C in the last 72 hours. CBG:  Recent Labs Lab 02/27/16 0723 02/27/16 1208 02/27/16 1717 02/27/16 2123 02/28/16 0856  GLUCAP 111* 168* 160* 137* 114*   Lipid Profile: No results for input(s): CHOL, HDL, LDLCALC, TRIG, CHOLHDL, LDLDIRECT in the last 72 hours. Thyroid Function Tests: No results for input(s): TSH, T4TOTAL, FREET4, T3FREE, THYROIDAB in the last 72 hours. Anemia Panel: No results for input(s): VITAMINB12, FOLATE, FERRITIN, TIBC, IRON, RETICCTPCT in the last 72 hours. Urine analysis:    Component Value Date/Time   COLORURINE AMBER (A) 05/13/2015 1402   APPEARANCEUR TURBID (A) 05/13/2015 1402   LABSPEC 1.013 05/13/2015 1402   PHURINE 5.5 05/13/2015 1402   GLUCOSEU NEGATIVE 05/13/2015 1402   HGBUR LARGE (A) 05/13/2015 1402   BILIRUBINUR SMALL (A) 05/13/2015 1402   KETONESUR NEGATIVE 05/13/2015 1402   PROTEINUR NEGATIVE 05/13/2015 1402   UROBILINOGEN 1.0 03/24/2010 1929   NITRITE POSITIVE (A) 05/13/2015 1402   LEUKOCYTESUR LARGE (A) 05/13/2015 1402   Sepsis Labs: @LABRCNTIP (procalcitonin:4,lacticidven:4)  ) Recent Results (from the past 240 hour(s))  Blood Culture (routine x 2)     Status: Abnormal   Collection Time: 2015-12-06  1:19 PM  Result Value Ref Range Status   Specimen Description BLOOD RIGHT FOREARM  Final   Special Requests BOTTLES DRAWN AEROBIC AND ANAEROBIC  5CC  Final   Culture  Setup Time   Final    GRAM NEGATIVE RODS IN BOTH AEROBIC AND ANAEROBIC BOTTLES CRITICAL RESULT CALLED TO, READ BACK BY AND VERIFIED WITH: T STONE,PHARMD AT 1016 02/22/16 BY L BENFIELD    Culture ESCHERICHIA COLI (A)  Final   Report Status 02/25/2016 FINAL  Final   Organism ID, Bacteria ESCHERICHIA COLI  Final      Susceptibility   Escherichia coli - MIC*    AMPICILLIN >=32 RESISTANT Resistant     CEFAZOLIN 16 SENSITIVE Sensitive     CEFEPIME <=1 SENSITIVE Sensitive     CEFTAZIDIME <=1 SENSITIVE Sensitive     CEFTRIAXONE  <=1 SENSITIVE Sensitive     CIPROFLOXACIN <=0.25 SENSITIVE Sensitive     GENTAMICIN <=1 SENSITIVE Sensitive     IMIPENEM <=0.25 SENSITIVE Sensitive     TRIMETH/SULFA <=20 SENSITIVE Sensitive     AMPICILLIN/SULBACTAM >=32 RESISTANT Resistant  PIP/TAZO <=4 SENSITIVE Sensitive     Extended ESBL NEGATIVE Sensitive     * ESCHERICHIA COLI  Blood Culture ID Panel (Reflexed)     Status: Abnormal   Collection Time: 03/10/2016  1:19 PM  Result Value Ref Range Status   Enterococcus species NOT DETECTED NOT DETECTED Final   Listeria monocytogenes NOT DETECTED NOT DETECTED Final   Staphylococcus species NOT DETECTED NOT DETECTED Final   Staphylococcus aureus NOT DETECTED NOT DETECTED Final   Streptococcus species NOT DETECTED NOT DETECTED Final   Streptococcus agalactiae NOT DETECTED NOT DETECTED Final   Streptococcus pneumoniae NOT DETECTED NOT DETECTED Final   Streptococcus pyogenes NOT DETECTED NOT DETECTED Final   Acinetobacter baumannii NOT DETECTED NOT DETECTED Final   Enterobacteriaceae species DETECTED (A) NOT DETECTED Final    Comment: CRITICAL RESULT CALLED TO, READ BACK BY AND VERIFIED WITH: T STONE,PHARMD AT 1016 02/22/16 BY L BENFIELD    Enterobacter cloacae complex NOT DETECTED NOT DETECTED Final   Escherichia coli DETECTED (A) NOT DETECTED Final    Comment: CRITICAL RESULT CALLED TO, READ BACK BY AND VERIFIED WITH: T STONE,PHARMD AT 1016 02/22/16 BY L BENFIELD    Klebsiella oxytoca NOT DETECTED NOT DETECTED Final   Klebsiella pneumoniae NOT DETECTED NOT DETECTED Final   Proteus species NOT DETECTED NOT DETECTED Final   Serratia marcescens NOT DETECTED NOT DETECTED Final   Carbapenem resistance NOT DETECTED NOT DETECTED Final   Haemophilus influenzae NOT DETECTED NOT DETECTED Final   Neisseria meningitidis NOT DETECTED NOT DETECTED Final   Pseudomonas aeruginosa NOT DETECTED NOT DETECTED Final   Candida albicans NOT DETECTED NOT DETECTED Final   Candida glabrata NOT DETECTED  NOT DETECTED Final   Candida krusei NOT DETECTED NOT DETECTED Final   Candida parapsilosis NOT DETECTED NOT DETECTED Final   Candida tropicalis NOT DETECTED NOT DETECTED Final  Blood Culture (routine x 2)     Status: None   Collection Time: 2016-03-10  1:56 PM  Result Value Ref Range Status   Specimen Description BLOOD LEFT FOREARM  Final   Special Requests BOTTLES DRAWN AEROBIC AND ANAEROBIC  5CC  Final   Culture NO GROWTH 5 DAYS  Final   Report Status 02/26/2016 FINAL  Final  Urine culture     Status: Abnormal   Collection Time: 03/10/2016  2:02 PM  Result Value Ref Range Status   Specimen Description URINE, RANDOM  Final   Special Requests NONE  Final   Culture >=100,000 COLONIES/mL ESCHERICHIA COLI (A)  Final   Report Status 02/25/2016 FINAL  Final   Organism ID, Bacteria ESCHERICHIA COLI (A)  Final      Susceptibility   Escherichia coli - MIC*    AMPICILLIN >=32 RESISTANT Resistant     CEFAZOLIN <=4 SENSITIVE Sensitive     CEFTRIAXONE <=1 SENSITIVE Sensitive     CIPROFLOXACIN <=0.25 SENSITIVE Sensitive     GENTAMICIN <=1 SENSITIVE Sensitive     IMIPENEM <=0.25 SENSITIVE Sensitive     NITROFURANTOIN <=16 SENSITIVE Sensitive     TRIMETH/SULFA <=20 SENSITIVE Sensitive     AMPICILLIN/SULBACTAM >=32 RESISTANT Resistant     PIP/TAZO <=4 SENSITIVE Sensitive     Extended ESBL NEGATIVE Sensitive     * >=100,000 COLONIES/mL ESCHERICHIA COLI  MRSA PCR Screening     Status: None   Collection Time: 03/10/2016 10:30 PM  Result Value Ref Range Status   MRSA by PCR NEGATIVE NEGATIVE Final    Comment:        The  GeneXpert MRSA Assay (FDA approved for NASAL specimens only), is one component of a comprehensive MRSA colonization surveillance program. It is not intended to diagnose MRSA infection nor to guide or monitor treatment for MRSA infections.   Culture, blood (routine x 2)     Status: None   Collection Time: 02/22/16  4:47 PM  Result Value Ref Range Status   Specimen Description  BLOOD LEFT ANTECUBITAL  Final   Special Requests BOTTLES DRAWN AEROBIC AND ANAEROBIC 5CC EA  Final   Culture NO GROWTH 5 DAYS  Final   Report Status 02/27/2016 FINAL  Final  Culture, blood (routine x 2)     Status: None   Collection Time: 02/22/16  4:53 PM  Result Value Ref Range Status   Specimen Description BLOOD RIGHT HAND  Final   Special Requests BOTTLES DRAWN AEROBIC AND ANAEROBIC 10CC EA  Final   Culture NO GROWTH 5 DAYS  Final   Report Status 02/27/2016 FINAL  Final      Radiology Studies: No results found.   Scheduled Meds: . allopurinol  300 mg Oral QPM  . benztropine  0.5 mg Oral BID  . brimonidine  1 drop Both Eyes TID  . Carbidopa-Levodopa ER  1 tablet Oral Q1500  . Carbidopa-Levodopa ER  1.5 tablet Oral BID  . cefTRIAXone (ROCEPHIN)  IV  2 g Intravenous Q24H  . chlorhexidine  15 mL Mouth Rinse BID  . clopidogrel  75 mg Oral Daily  . dorzolamide  1 drop Both Eyes BID   And  . timolol  1 drop Both Eyes BID  . heparin  5,000 Units Subcutaneous Q8H  . insulin aspart  0-5 Units Subcutaneous QHS  . insulin aspart  0-9 Units Subcutaneous TID WC  . latanoprost  1 drop Both Eyes QHS  . mouth rinse  15 mL Mouth Rinse q12n4p  . metroNIDAZOLE  500 mg Oral Q8H  . pantoprazole sodium  40 mg Oral Daily  . QUEtiapine  50 mg Oral QHS  . sennosides  5 mL Oral QHS  . sertraline  25 mg Oral QHS   Continuous Infusions:   LOS: 7 days   Time Spent in minutes   30 minutes  Zaryan Yakubov D.O. on 02/28/2016 at 11:00 AM  Between 7am to 7pm - Pager - [phone]  After 7pm go to [email or web address] - password [email or web address]  And look for the night coverage person covering for me after hours  [facility] Office  [phone]

## 2016-02-28 NOTE — Consult Note (Signed)
Consultation Note Date: 02/28/2016   Patient Name: Terrance Perez  DOB: [birth year]  MRN: [id number]  Age / Sex: 82 y.o., male  PCP: Burnard Bunting, MD Referring Physician: Cristal Ford, DO  Reason for Consultation: Establishing goals of care, Non pain symptom management and Psychosocial/spiritual support  HPI/Patient Profile: 82 y.o. male  with past medical history of sCHF (EF 20-25%), CAD, CABG, DM, dysphagia, glaucoma, gout, HLD, HTN, schizophrenia, and Parkinson's. He was admitted on 02/02/2016 with cough, confusion, weakness, and gross hematuria. Work-up revealed Sepsis due to E. Coli UTI, PNA, AKI, and metabolic encephalopathy. Speech evaluation revealed high risk for aspiration with poor prognosis for improvement (severe aspiration risk).  Clinical Assessment and Goals of Care: I met with Terrance Perez at her husband's bedside. I explained that I was on the Palliative Care Team, and our role was to support patients and families with the stress and symptoms associated with serious illness, and support them in clarifying goals of care and navigating difficult medical decisions. She was eager to meet with me. Our conversation centered around her perceptions of his health prior to admission, her understanding of his current health state, and her expectations/goals after discharge.   Prior to admission she detailed a slow but progressive physical and mental decline. Since February he has been increasingly in a chair with decreasing ambulation. Just prior to admission he spent all of his time in the chair, sleeping or with his eyes closed, and listening to the television. He was able to feed himself prior to admission, but his wife reports that his intake has been decreasing and she knows he is not eating or drinking enough, also noting ongoing weight loss. Mentally, he has been exhibiting more signs of  confusion and slowed responses.   Presently, she has a good understanding of his medical issues. She is able to verbalize the seriousness of his problems, and was able to explain the issues with his heart (CHF), lungs (PNA), and renal (worsening AKI and UTI r/t bacteremia). She was also aware of his dysphagia, but did not understand the relationship between his swallowing issues and pneumonia. I discussed the swallow study results, and explained that he was at a severe aspiration risk that was likely not going to get better and may, in fact, continue to worsen. We discussed the implications of aspiration, specifically the concern of repeat episodes of pneumonia.   In discussing the post hospitalization plan I explored her goals of care. She and Mr. Clowers have not had conversations about the end of life, but she was able to express that he would want to be at home and he would not want to suffer. She feels strongly that interventions to promote quality of life should be the focus, over life prolonging measures. In alignement with these goals, we were able to discuss code status, and she decided to change his code status to DNR and would not want a PEG. Furthermore, we discussed two different health trajectories. Ideally, he would continue to improve and be eating and  drinking. In this case she would want him to go to rehab (if he needed it), or go home (if he can stand and walk 3 steps). Alternatively, if he should worsen or remains unwilling to eat, we discussed shifting the focus to more comfort based care, with the utilization of Hospice services. In the case that he worsens, we would investigate residential hospice placement. The plan is watch him over the next day or so to see how things change.   Primary Decision Maker Pt's wife, Terrance Perez   SUMMARY OF RECOMMENDATIONS    DNR; order placed in Epic and yellow form placed in paper chart; No PEG  Goals of care are clear: focus on quality of life  over prolongation. His trajectory of care will be determined by how he does over the next few days. If he improves, the plan would be for him to go to either SNF/rehab or home. If he deteriorates, we will focus on comfort care and pursue Hospice.   SW updated on need for Palliative Care at SNF   CM updated on need for evaluation of available home support for after rehab  Code Status/Advance Care Planning:  DNR  Symptom Management:   Dyspnea: Concern for morphine use in setting of renal dysfunction, changed to IV dilaudid  Pain: PRN oxycodone and IV dilaudid  Nausea: PRN Zofran  Bowel regimen: Sch senna  Dry mouth: PRN Biotene  Palliative Prophylaxis:   Aspiration, Bowel Regimen and Turn Reposition  Additional Recommendations (Limitations, Scope, Preferences):  Full Scope Treatment; will evaluate progress daily  Psycho-social/Spiritual:   Desire for further Chaplaincy support:no  Additional Recommendations: Caregiving  Support/Resources  Prognosis:   Unable to determine  Discharge Planning: To Be Determined      Primary Diagnoses: Present on Admission: . Encephalopathy acute . Parkinsonism ([place]) . Hypertension . Hyperlipidemia . SIRS (systemic inflammatory response syndrome) ([facility])   I have reviewed the medical record, interviewed the patient and family, and examined the patient. The following aspects are pertinent.  Past Medical History:  Diagnosis Date  . Abnormality of gait 12/02/2012  . Coronary artery disease   . CVD (cerebrovascular disease)   . Diabetes mellitus   . Diastolic dysfunction   . Dysphagia, pharyngoesophageal phase 06/08/2014   Thickened fluids  . Dyspnea   . Fracture, humerus    right  . Glaucoma   . Gout   . History of orthostatic hypotension   . Hyperlipidemia   . Hypertension   . Obesity   . Paranoid schizophrenia ([place])   . Parkinsonism ([place]) 12/02/2012  . PNA (pneumonia) May 2015  . RLS (restless legs syndrome) 02/04/2016  .  Tremor    Social History   Social History  . Marital status: Married    Spouse name: Terrance Perez  . Number of children: 2  . Years of education: GED   Occupational History  . retired Retired    retired   Social History Main Topics  . Smoking status: Former Smoker    Quit date: 03/31/1958  . Smokeless tobacco: Never Used     Comment: quit 1960's  . Alcohol use No  . Drug use: No  . Sexual activity: No   Other Topics Concern  . None   Social History Narrative   Patient is left handed.   Patient occasionally drinks 1 cup of caffeine.   Family History  Problem Relation Age of Onset  . Diabetes Father   . Cystic fibrosis Father   . Heart disease  Father    Scheduled Meds: . allopurinol  300 mg Oral QPM  . benztropine  0.5 mg Oral BID  . brimonidine  1 drop Both Eyes TID  . Carbidopa-Levodopa ER  1 tablet Oral Q1500  . Carbidopa-Levodopa ER  1.5 tablet Oral BID  . cefTRIAXone (ROCEPHIN)  IV  2 g Intravenous Q24H  . chlorhexidine  15 mL Mouth Rinse BID  . clopidogrel  75 mg Oral Daily  . dorzolamide  1 drop Both Eyes BID   And  . timolol  1 drop Both Eyes BID  . heparin  5,000 Units Subcutaneous Q8H  . insulin aspart  0-5 Units Subcutaneous QHS  . insulin aspart  0-9 Units Subcutaneous TID WC  . latanoprost  1 drop Both Eyes QHS  . mouth rinse  15 mL Mouth Rinse q12n4p  . metroNIDAZOLE  500 mg Oral Q8H  . pantoprazole sodium  40 mg Oral Daily  . QUEtiapine  50 mg Oral QHS  . sertraline  25 mg Oral QHS   Continuous Infusions: PRN Meds:.acetaminophen **OR** acetaminophen, HYDROmorphone (DILAUDID) injection, ondansetron **OR** ondansetron (ZOFRAN) IV, oxyCODONE No Known Allergies   Review of Systems: Unable to obtain due to encephalopathy  Physical Exam  Constitutional: He appears well-developed and well-nourished. He appears lethargic. No distress.  HENT:  Head: Normocephalic and atraumatic.  Mouth/Throat: Mucous membranes are dry.  Eyes:  Pt would not open eyes    Neck: Normal range of motion.  Cardiovascular: Normal rate.   Pulmonary/Chest: No accessory muscle usage. Tachypnea noted. He has decreased breath sounds in the right lower field and the left lower field. He has no wheezes. He has rhonchi in the left upper field and the left middle field.  Abdominal: Soft. Normal appearance and bowel sounds are normal.  Neurological: He appears lethargic.  Pt lethargic, will not open eyes and is not speaking or following commands.  Skin: Skin is warm and dry. There is pallor.  Psychiatric:  Pt not interactive    Vital Signs: BP 128/66 (BP Location: Right Arm)   Pulse 81   Temp 98.8 F (37.1 C) (Axillary)   Resp (!) 24   Ht 5' 7"  (1.702 m)   Wt 88.7 kg (195 lb 8.8 oz)   SpO2 98%   BMI 30.63 kg/m  Pain Assessment: No/denies pain POSS *See Group Information*: S-Acceptable,Sleep, easy to arouse Pain Score: Asleep  SpO2: SpO2: 98 % O2 Device:SpO2: 98 % O2 Flow Rate: .O2 Flow Rate (L/min): 2 L/min  IO: Intake/output summary:  Intake/Output Summary (Last 24 hours) at 02/28/16 0916 Last data filed at 02/28/16 0600  Gross per 24 hour  Intake              180 ml  Output              800 ml  Net             -620 ml    LBM: Last BM Date: 02/26/16 Baseline Weight: Weight: 83.9 kg (185 lb) Most recent weight: Weight: 88.7 kg (195 lb 8.8 oz)     Palliative Assessment/Data:  PPS 20%; fluctuating based on mentation   Flowsheet Rows   Flowsheet Row Most Recent Value  Intake Tab  Referral Department  Hospitalist  Unit at Time of Referral  Intermediate Care Unit  Palliative Care Primary Diagnosis  Neurology  Date Notified  02/27/16  Palliative Care Type  New Palliative care  Reason for referral  Clarify Goals of Care  Date  of Admission  02/24/2016  # of days IP prior to Palliative referral  6  Clinical Assessment  Psychosocial & Spiritual Assessment  Palliative Care Outcomes      Time In: 1300 Time Out: 1430 Time Total: 90  minutes Greater than 50%  of this time was spent counseling and coordinating care related to the above assessment and plan.  Signed by: Charlynn Court, NP Palliative Medicine Team Team Phone # [phone]

## 2016-02-29 DIAGNOSIS — I10 Essential (primary) hypertension: Secondary | ICD-10-CM

## 2016-02-29 LAB — GLUCOSE, CAPILLARY
GLUCOSE-CAPILLARY: 125 mg/dL — AB (ref 65–99)
Glucose-Capillary: 122 mg/dL — ABNORMAL HIGH (ref 65–99)
Glucose-Capillary: 96 mg/dL (ref 65–99)
Glucose-Capillary: 101 mg/dL — ABNORMAL HIGH (ref 65–99)

## 2016-02-29 LAB — BASIC METABOLIC PANEL
Anion gap: 12 (ref 5–15)
BUN: 50 mg/dL — ABNORMAL HIGH (ref 6–20)
CHLORIDE: 119 mmol/L — AB (ref 101–111)
CO2: 24 mmol/L (ref 22–32)
Calcium: 8.6 mg/dL — ABNORMAL LOW (ref 8.9–10.3)
Creatinine, Ser: 2.45 mg/dL — ABNORMAL HIGH (ref 0.61–1.24)
GFR calc non Af Amer: 24 mL/min — ABNORMAL LOW (ref >60)
GFR, EST AFRICAN AMERICAN: 27 mL/min — AB (ref >60)
Glucose, Bld: 131 mg/dL — ABNORMAL HIGH (ref 65–99)
POTASSIUM: 3 mmol/L — AB (ref 3.5–5.1)
SODIUM: 155 mmol/L — AB (ref 135–145)

## 2016-02-29 LAB — MAGNESIUM: MAGNESIUM: 2.3 mg/dL (ref 1.7–2.4)

## 2016-02-29 LAB — CBC
HEMATOCRIT: 35.4 % — AB (ref 39.0–52.0)
HEMOGLOBIN: 10.8 g/dL — AB (ref 13.0–17.0)
MCH: 30 pg (ref 26.0–34.0)
MCHC: 30.5 g/dL (ref 30.0–36.0)
MCV: 98.3 fL (ref 78.0–100.0)
Platelets: 191 10*3/uL (ref 150–400)
RBC: 3.6 MIL/uL — AB (ref 4.22–5.81)
RDW: 17 % — ABNORMAL HIGH (ref 11.5–15.5)
WBC: 8 10*3/uL (ref 4.0–10.5)

## 2016-02-29 MED ORDER — POTASSIUM CHLORIDE 20 MEQ/15ML (10%) PO SOLN
40.0000 meq | Freq: Two times a day (BID) | ORAL | Status: AC
  Filled 2016-02-29: qty 30

## 2016-02-29 MED ORDER — CARBIDOPA-LEVODOPA 25-100 MG PO TABS: 1.0000 | ORAL_TABLET | Freq: Every day | ORAL | Status: DC

## 2016-02-29 MED ORDER — CARBIDOPA-LEVODOPA 25-100 MG PO TABS: 1.5000 | ORAL_TABLET | Freq: Two times a day (BID) | ORAL | Status: DC

## 2016-02-29 NOTE — Progress Notes (Signed)
Patients O2 dropped to 79, RN placed patient on venti mask. O2 would not increase, but rather dropped into the 30's. RT was on unit at time of event, came to assist, patient placed on non-rebreather. O2 increased to mid 90's, but very shallow breathing remains. RN notified wife of change and she stated she was coming as soon as she could get a ride. RN will continue to monitor.

## 2016-02-29 NOTE — Progress Notes (Signed)
Speech Language Pathology Treatment: Dysphagia  Patient Details Name: Allyson SabalStephen R Gathers MRN: [id number] DOB: Aug 27, 1933 Today's Date: 02/29/2016 Time: [id number] SLP Time Calculation (min) (ACUTE ONLY): 15 min  Assessment / Plan / Recommendation Clinical Impression  Pt with open eyes and inconsistently responsive to verbal prompts (occasional grunts/ nods, no verbalizations). Attempted a small trial of puree consistency. Pt had some laryngeal elevation upon palpation; however, not sure if pt actually fully swallowed. Most of the bolus remained in the oral cavity; pt appeared to attempt re-swallow given maximal verbal/ visual cues but most of the bolus was removed via oral suction. Pt's recent MBS showed moderate-severe oropharyngeal deficits. Will defer diet recommendation to MD; noted palliative consult. Pt may need meds via alternative means rather than crushed in puree; will need close supervision if meals are provided and would also recommend oral care before/ after meals. Will continue to follow to check diet tolerance.   HPI HPI: 82 y.o.malewith medical history significant for CAD, CABG, DM, dysphagia, glaucoma, gout, HLD, HTN, schizophrenia, Parkinson's, RLS presenting with cough, confusion, weakness, and gross hematuria. Symptoms progressive x2 weeks. Dx acute encephalopathy, UTI, possible pna. 11/23 CXR shows increased interstitial and airspace opacity in the left mid to lower lung, may represent infiltrate. Pt spends most of day at home in a lift chair; [facility]  [name] cares for her husband at home.  She thickens his liquids (consistency unclear); feeds him his meals; he sleeps in lift chair and she sleeps next to him in recliner in living room.        SLP Plan  Continue with current plan of care     Recommendations  Diet recommendations: Other(comment) (Defer diet decision to MD) Liquids provided via: Straw Medication  Administration:  (may attempt crushed with puree, or alt means if unable to sw) Supervision: Staff to assist with self feeding;Full supervision/cueing for compensatory strategies Compensations: Slow rate;Small sips/bites;Effortful swallow;Multiple dry swallows after each bite/sip;Follow solids with liquid Postural Changes and/or Swallow Maneuvers: Seated upright 90 degrees;Upright 30-60 min after meal                Oral Care Recommendations: Oral care BID;Oral care before and after PO Follow up Recommendations: Skilled Nursing facility Plan: Continue with current plan of care       GO                [place] Kung[place], Niharika Savino K, MA, CCC-SLP 02/29/2016, 10:51 AM [phone]

## 2016-02-29 NOTE — Progress Notes (Signed)
PROGRESS NOTE    Terrance Perez  GQQ:[id number] DOB: [birth year] DOA: 02/08/2016 PCP: Geoffery Lyons, MD   Chief Complaint  Patient presents with  . Altered Mental Status    Brief Narrative:  HPI on 02/08/2016 by Dr. Linna Darner Terrance Perez is a 82 y.o. male with medical history significant of CAD, CABG, DM, dysphagia, glaucoma, gout, HLD, HTN, schizophrenia, Parkinson's, RLS presenting with cough, confusion, weakness, and gross hematuria. History provided by EDP and patient's wife as patient is unable to provide reliable information at this time.   Patient symptoms started approximately 2 weeks ago with a cough. Became significantly worse over the last week. However, since wife states that over the last day it has somewhat improved. His been associated with generalized weakness over the last 4-5 days which is getting worse along with progressive intermittent confusion. At baseline patient has days from time to time where he is somewhat confused. No formal diagnosis of dementia per wife. Patient also had single episode of gross hematuria that wife describes as almost entirely blood. Occurred approximate 4 days ago. Denies any fevers, abdominal pain, dysuria, frequency, chest pain, palpitations, neck stiffness, headache, LOC, fall.  Interim history Admitted for UTI and pneumonia, questionable aspiration. Found to have Escherichia coli bacteremia. Patient is having worsening renal function however responding to diuresis. Palliative care consulted and patient currently DNR.  Assessment & Plan   Sepsis due to Ecoli UTI/Bacteremia -Sepsis was present upon admission. Protocol was initiated. -leukocytosis resolved  -Patient found to have Escherichia coli UTI bacteremia -Transitioned from Zosyn to ceftriaxone  Left lower lobe pneumonia (present on admission)  -Question whether this is due to aspiration -Currently receiving Lasix by mouth -Continue supplemental oxygen maintain  saturation above 92% -Continue aspiration precautions -Transitioned to ceftriaxone and Flagyl  AKI on CKD, stage 3 -Creatinine currently 2.45  -Given worsening creatinine, will hold Lasix -Continue to monitor urine output- UOP over past [id number] -Metabolic acidosis resolved  Diabetes mellitus, type II -Continue insulin sliding scale and CABG monitoring  Chronic Systolic heart failure -Given worsening creatinine, will hold Lasix -Beta blocker as well as ACE inhibitor also held due to risk of hypotension and acute kidney injury -Continue to monitor intake and output, daily weight  Essential hypertension -Antihypertensives held due to risk of hypotension -Blood pressure currently well-controlled   Schizophrenia and parkinsons  -Patient more awake and alert, continue on carbidopa and levodopa. Quetiapine and sertraline only at night.   Metabolic encephalopathy -Appears to be improving -Multifactorial due to sepsis and medication induced, continue supportive care and psychotropic medications only at night.  Dysphagia -Speech therapy consultation appreciated -Continue dysphagia 1 diet with nectar thick liquids -Patient unable to swallow safely -Palliative care did speak with patient's family, no peg tube  GERD -Continue PPI  History of coronary artery disease/CVD -Continue Plavix  Gout -Continue allopurinol  Goals of care -Given Parkinson's disease as well as questionable encephalopathy/dementia, have consulted palliative care for goals of care discussion with family as well as CODE STATUS. -Palliative care met with patient's wife and code status changed to DNR and peg tube would not be wanted.  DVT Prophylaxis  heparin  Code Status: DNR  Family Communication: None bedside  Disposition Plan: Admitted, continue to monitor and stepdown  Consultants Palliative care  Procedures  None  Antibiotics   Anti-infectives    Start     Dose/Rate Route Frequency  Ordered Stop   02/27/16 1000  cefTRIAXone (ROCEPHIN) 2 g in dextrose 5 %  50 mL IVPB     2 g 100 mL/hr over 30 Minutes Intravenous Every 24 hours 02/27/16 0903     02/27/16 0915  metroNIDAZOLE (FLAGYL) tablet 500 mg     500 mg Oral Every 8 hours 02/27/16 0903     02/24/16 1400  piperacillin-tazobactam (ZOSYN) IVPB 3.375 g  Status:  Discontinued     3.375 g 12.5 mL/hr over 240 Minutes Intravenous Every 8 hours 02/24/16 1345 02/27/16 0902   02/22/16 1115  Ampicillin-Sulbactam (UNASYN) 3 g in sodium chloride 0.9 % 100 mL IVPB  Status:  Discontinued     3 g 200 mL/hr over 30 Minutes Intravenous Every 12 hours 02/22/16 1105 02/24/16 1316   02/22/16 1000  cefTRIAXone (ROCEPHIN) 1 g in dextrose 5 % 50 mL IVPB  Status:  Discontinued     1 g 100 mL/hr over 30 Minutes Intravenous Every 24 hours 02/22/2016 1658 02/22/16 0804   02/22/16 1000  azithromycin (ZITHROMAX) 500 mg in dextrose 5 % 250 mL IVPB  Status:  Discontinued     500 mg 250 mL/hr over 60 Minutes Intravenous Every 24 hours 02/20/2016 1658 02/22/16 0804   02/22/16 0945  piperacillin-tazobactam (ZOSYN) IVPB 3.375 g  Status:  Discontinued     3.375 g 12.5 mL/hr over 240 Minutes Intravenous Every 8 hours 02/22/16 0942 02/22/16 1105   02/20/2016 1515  azithromycin (ZITHROMAX) 500 mg in dextrose 5 % 250 mL IVPB     500 mg 250 mL/hr over 60 Minutes Intravenous  Once 02/18/2016 1501 02/26/2016 1621   02/06/2016 1330  cefTRIAXone (ROCEPHIN) 1 g in dextrose 5 % 50 mL IVPB  Status:  Discontinued     1 g 100 mL/hr over 30 Minutes Intravenous Every 24 hours 02/07/2016 1325 02/25/2016 1746      Subjective:   Terrance Perez seen and examined today.  Patient sleepy this morning, not very interactive. Does say good morning and opens his eyes.  Objective:   Vitals:   02/29/16 0320 02/29/16 0459 02/29/16 0700 02/29/16 1100  BP: 131/78  129/67 124/65  Pulse: (!) 102  87 85  Resp: (!) 25  (!) 24 (!) 29  Temp: 99.6 F (37.6 C)  99.9 F (37.7 C) 100.2 F (37.9  C)  TempSrc: Axillary  Axillary Axillary  SpO2: 96%  98% 94%  Weight:  84.3 kg (185 lb 13.6 oz)    Height:        Intake/Output Summary (Last 24 hours) at 02/29/16 1215 Last data filed at 02/29/16 1100  Gross per 24 hour  Intake              100 ml  Output             1000 ml  Net             -900 ml   Filed Weights   02/27/16 0355 02/28/16 0323 02/29/16 0459  Weight: 87.6 kg (193 lb 2 oz) 88.7 kg (195 lb 8.8 oz) 84.3 kg (185 lb 13.6 oz)    Exam  General: Well developed, chronically ill appearing, no distress  HEENT: NCAT, mucous membranes moist.   Cardiovascular: S1 S2 auscultated, RRR  Respiratory: Diminished breath sounds  Abdomen: Soft, nontender, nondistended, + bowel sounds  Extremities: warm dry without cyanosis clubbing or edema  Data Reviewed: I have personally reviewed following labs and imaging studies  CBC:  Recent Labs Lab 02/23/16 0531 02/24/16 0423 02/25/16 0543 02/26/16 0520 02/27/16 0421 02/28/16 0326 02/29/16 0537  WBC  6.9 7.9 8.4 9.1 8.6 8.5 8.0  NEUTROABS 4.6 4.7 4.9 5.7 5.1  --   --   HGB 10.1* 10.4* 10.5* 10.4* 10.5* 10.6* 10.8*  HCT 32.0* 32.7* 32.7* 33.3* 33.9* 34.2* 35.4*  MCV 95.2 95.6 95.9 97.1 97.7 97.7 98.3  PLT 211 210 207 186 184 192 678   Basic Metabolic Panel:  Recent Labs Lab 02/25/16 0543 02/26/16 0520 02/27/16 0421 02/28/16 0326 02/29/16 0537  NA 144 148* 150* 151* 155*  K 3.1* 3.0* 3.2* 3.1* 3.0*  CL 113* 116* 116* 117* 119*  CO2 20* _0 GLUCOSE 139* 135* 117* 134* 131*  BUN 37* 41* 42* 46* 50*  CREATININE 2.02* 2.14* 2.29* 2.44* 2.45*  CALCIUM 8.3* 8.4* 8.5* 8.5* 8.6*  MG  --   --  2.2  --  2.3   GFR: Estimated Creatinine Clearance: 25.4 mL/min (by C-G formula based on SCr of 2.45 mg/dL (H)). Liver Function Tests: No results for input(s): AST, ALT, ALKPHOS, BILITOT, PROT, ALBUMIN in the last 168 hours. No results for input(s): LIPASE, AMYLASE in the last 168 hours. No results for input(s):  AMMONIA in the last 168 hours. Coagulation Profile: No results for input(s): INR, PROTIME in the last 168 hours. Cardiac Enzymes: No results for input(s): CKTOTAL, CKMB, CKMBINDEX, TROPONINI in the last 168 hours. BNP (last 3 results) No results for input(s): PROBNP in the last 8760 hours. HbA1C: No results for input(s): HGBA1C in the last 72 hours. CBG:  Recent Labs Lab 02/28/16 1656 02/28/16 1802 02/28/16 2123 02/29/16 0738 02/29/16 1117  GLUCAP 167* 163* 145* 125* 122*   Lipid Profile: No results for input(s): CHOL, HDL, LDLCALC, TRIG, CHOLHDL, LDLDIRECT in the last 72 hours. Thyroid Function Tests: No results for input(s): TSH, T4TOTAL, FREET4, T3FREE, THYROIDAB in the last 72 hours. Anemia Panel: No results for input(s): VITAMINB12, FOLATE, FERRITIN, TIBC, IRON, RETICCTPCT in the last 72 hours. Urine analysis:    Component Value Date/Time   COLORURINE AMBER (A) 02/20/2016 1402   APPEARANCEUR TURBID (A) 02/11/2016 1402   LABSPEC 1.013 02/20/2016 1402   PHURINE 5.5 02/03/2016 1402   GLUCOSEU NEGATIVE 02/04/2016 1402   HGBUR LARGE (A) 02/25/2016 1402   BILIRUBINUR SMALL (A) 02/24/2016 1402   KETONESUR NEGATIVE 02/07/2016 1402   PROTEINUR NEGATIVE 02/16/2016 1402   UROBILINOGEN 1.0 03/24/2010 1929   NITRITE POSITIVE (A) 02/25/2016 1402   LEUKOCYTESUR LARGE (A) 01/31/2016 1402   Sepsis Labs: _1 (procalcitonin:4,lacticidven:4)  ) Recent Results (from the past 240 hour(s))  Blood Culture (routine x 2)     Status: Abnormal   Collection Time: 02/26/2016  1:19 PM  Result Value Ref Range Status   Specimen Description BLOOD RIGHT FOREARM  Final   Special Requests BOTTLES DRAWN AEROBIC AND ANAEROBIC  5CC  Final   Culture  Setup Time   Final    GRAM NEGATIVE RODS IN BOTH AEROBIC AND ANAEROBIC BOTTLES CRITICAL RESULT CALLED TO, READ BACK BY AND VERIFIED WITH: T STONE,PHARMD AT 1016 02/22/16 BY L BENFIELD    Culture ESCHERICHIA COLI (A)  Final   Report Status  02/25/2016 FINAL  Final   Organism ID, Bacteria ESCHERICHIA COLI  Final      Susceptibility   Escherichia coli - MIC*    AMPICILLIN >=32 RESISTANT Resistant     CEFAZOLIN 16 SENSITIVE Sensitive     CEFEPIME <=1 SENSITIVE Sensitive     CEFTAZIDIME <=1 SENSITIVE Sensitive     CEFTRIAXONE <=1 SENSITIVE Sensitive     CIPROFLOXACIN <=  0.25 SENSITIVE Sensitive     GENTAMICIN <=1 SENSITIVE Sensitive     IMIPENEM <=0.25 SENSITIVE Sensitive     TRIMETH/SULFA <=20 SENSITIVE Sensitive     AMPICILLIN/SULBACTAM >=32 RESISTANT Resistant     PIP/TAZO <=4 SENSITIVE Sensitive     Extended ESBL NEGATIVE Sensitive     * ESCHERICHIA COLI  Blood Culture ID Panel (Reflexed)     Status: Abnormal   Collection Time: 02/17/2016  1:19 PM  Result Value Ref Range Status   Enterococcus species NOT DETECTED NOT DETECTED Final   Listeria monocytogenes NOT DETECTED NOT DETECTED Final   Staphylococcus species NOT DETECTED NOT DETECTED Final   Staphylococcus aureus NOT DETECTED NOT DETECTED Final   Streptococcus species NOT DETECTED NOT DETECTED Final   Streptococcus agalactiae NOT DETECTED NOT DETECTED Final   Streptococcus pneumoniae NOT DETECTED NOT DETECTED Final   Streptococcus pyogenes NOT DETECTED NOT DETECTED Final   Acinetobacter baumannii NOT DETECTED NOT DETECTED Final   Enterobacteriaceae species DETECTED (A) NOT DETECTED Final    Comment: CRITICAL RESULT CALLED TO, READ BACK BY AND VERIFIED WITH: T STONE,PHARMD AT 1016 02/22/16 BY L BENFIELD    Enterobacter cloacae complex NOT DETECTED NOT DETECTED Final   Escherichia coli DETECTED (A) NOT DETECTED Final    Comment: CRITICAL RESULT CALLED TO, READ BACK BY AND VERIFIED WITH: T STONE,PHARMD AT 1016 02/22/16 BY L BENFIELD    Klebsiella oxytoca NOT DETECTED NOT DETECTED Final   Klebsiella pneumoniae NOT DETECTED NOT DETECTED Final   Proteus species NOT DETECTED NOT DETECTED Final   Serratia marcescens NOT DETECTED NOT DETECTED Final   Carbapenem  resistance NOT DETECTED NOT DETECTED Final   Haemophilus influenzae NOT DETECTED NOT DETECTED Final   Neisseria meningitidis NOT DETECTED NOT DETECTED Final   Pseudomonas aeruginosa NOT DETECTED NOT DETECTED Final   Candida albicans NOT DETECTED NOT DETECTED Final   Candida glabrata NOT DETECTED NOT DETECTED Final   Candida krusei NOT DETECTED NOT DETECTED Final   Candida parapsilosis NOT DETECTED NOT DETECTED Final   Candida tropicalis NOT DETECTED NOT DETECTED Final  Blood Culture (routine x 2)     Status: None   Collection Time: 02/04/2016  1:56 PM  Result Value Ref Range Status   Specimen Description BLOOD LEFT FOREARM  Final   Special Requests BOTTLES DRAWN AEROBIC AND ANAEROBIC  5CC  Final   Culture NO GROWTH 5 DAYS  Final   Report Status 02/26/2016 FINAL  Final  Urine culture     Status: Abnormal   Collection Time: 02/28/2016  2:02 PM  Result Value Ref Range Status   Specimen Description URINE, RANDOM  Final   Special Requests NONE  Final   Culture >=100,000 COLONIES/mL ESCHERICHIA COLI (A)  Final   Report Status 02/25/2016 FINAL  Final   Organism ID, Bacteria ESCHERICHIA COLI (A)  Final      Susceptibility   Escherichia coli - MIC*    AMPICILLIN >=32 RESISTANT Resistant     CEFAZOLIN <=4 SENSITIVE Sensitive     CEFTRIAXONE <=1 SENSITIVE Sensitive     CIPROFLOXACIN <=0.25 SENSITIVE Sensitive     GENTAMICIN <=1 SENSITIVE Sensitive     IMIPENEM <=0.25 SENSITIVE Sensitive     NITROFURANTOIN <=16 SENSITIVE Sensitive     TRIMETH/SULFA <=20 SENSITIVE Sensitive     AMPICILLIN/SULBACTAM >=32 RESISTANT Resistant     PIP/TAZO <=4 SENSITIVE Sensitive     Extended ESBL NEGATIVE Sensitive     * >=100,000 COLONIES/mL ESCHERICHIA COLI  MRSA PCR Screening  Status: None   Collection Time: 02/20/2016 10:30 PM  Result Value Ref Range Status   MRSA by PCR NEGATIVE NEGATIVE Final    Comment:        The GeneXpert MRSA Assay (FDA approved for NASAL specimens only), is one component of  a comprehensive MRSA colonization surveillance program. It is not intended to diagnose MRSA infection nor to guide or monitor treatment for MRSA infections.   Culture, blood (routine x 2)     Status: None   Collection Time: 02/22/16  4:47 PM  Result Value Ref Range Status   Specimen Description BLOOD LEFT ANTECUBITAL  Final   Special Requests BOTTLES DRAWN AEROBIC AND ANAEROBIC 5CC EA  Final   Culture NO GROWTH 5 DAYS  Final   Report Status 02/27/2016 FINAL  Final  Culture, blood (routine x 2)     Status: None   Collection Time: 02/22/16  4:53 PM  Result Value Ref Range Status   Specimen Description BLOOD RIGHT HAND  Final   Special Requests BOTTLES DRAWN AEROBIC AND ANAEROBIC 10CC EA  Final   Culture NO GROWTH 5 DAYS  Final   Report Status 02/27/2016 FINAL  Final      Radiology Studies: No results found.   Scheduled Meds: . allopurinol  300 mg Oral QPM  . benztropine  0.5 mg Oral BID  . brimonidine  1 drop Both Eyes TID  . carbidopa-levodopa  1 tablet Oral q1800   And  . carbidopa-levodopa  1.5 tablet Oral BID  . cefTRIAXone (ROCEPHIN)  IV  2 g Intravenous Q24H  . chlorhexidine  15 mL Mouth Rinse BID  . clopidogrel  75 mg Oral Daily  . dorzolamide  1 drop Both Eyes BID   And  . timolol  1 drop Both Eyes BID  . heparin  5,000 Units Subcutaneous Q8H  . insulin aspart  0-5 Units Subcutaneous QHS  . insulin aspart  0-9 Units Subcutaneous TID WC  . latanoprost  1 drop Both Eyes QHS  . mouth rinse  15 mL Mouth Rinse q12n4p  . metroNIDAZOLE  500 mg Oral Q8H  . pantoprazole sodium  40 mg Oral Daily  . potassium chloride  40 mEq Oral BID  . QUEtiapine  50 mg Oral QHS  . sennosides  5 mL Oral QHS  . sertraline  25 mg Oral QHS   Continuous Infusions:   LOS: 8 days   Time Spent in minutes   30 minutes  Shahin Knierim D.O. on 02/29/2016 at 12:15 PM  Between 7am to 7pm - Pager - [phone]  After 7pm go to [email or web address] - password [email or web address]  And look for the night  coverage person covering for me after hours  [facility] Office  [phone]

## 2016-02-29 DEATH — deceased

## 2016-03-01 LAB — BASIC METABOLIC PANEL
ANION GAP: 15 (ref 5–15)
BUN: 59 mg/dL — ABNORMAL HIGH (ref 6–20)
CALCIUM: 8.7 mg/dL — AB (ref 8.9–10.3)
CO2: 23 mmol/L (ref 22–32)
Chloride: 121 mmol/L — ABNORMAL HIGH (ref 101–111)
Creatinine, Ser: 2.56 mg/dL — ABNORMAL HIGH (ref 0.61–1.24)
GFR calc non Af Amer: 22 mL/min — ABNORMAL LOW (ref >60)
GFR, EST AFRICAN AMERICAN: 26 mL/min — AB (ref >60)
Glucose, Bld: 100 mg/dL — ABNORMAL HIGH (ref 65–99)
Potassium: 2.8 mmol/L — ABNORMAL LOW (ref 3.5–5.1)
Sodium: 159 mmol/L — ABNORMAL HIGH (ref 135–145)

## 2016-03-01 LAB — CBC
HEMATOCRIT: 39.1 % (ref 39.0–52.0)
HEMOGLOBIN: 11.5 g/dL — AB (ref 13.0–17.0)
MCH: 29.8 pg (ref 26.0–34.0)
MCHC: 29.4 g/dL — ABNORMAL LOW (ref 30.0–36.0)
MCV: 101.3 fL — ABNORMAL HIGH (ref 78.0–100.0)
Platelets: 231 10*3/uL (ref 150–400)
RBC: 3.86 MIL/uL — AB (ref 4.22–5.81)
RDW: 17.5 % — AB (ref 11.5–15.5)
WBC: 10.5 10*3/uL (ref 4.0–10.5)

## 2016-03-01 LAB — GLUCOSE, CAPILLARY
GLUCOSE-CAPILLARY: 94 mg/dL (ref 65–99)
GLUCOSE-CAPILLARY: 105 mg/dL — AB (ref 65–99)
Glucose-Capillary: 111 mg/dL — ABNORMAL HIGH (ref 65–99)

## 2016-03-01 MED ORDER — LORAZEPAM 2 MG/ML IJ SOLN: 1.0000 mg | INTRAMUSCULAR | Status: DC | PRN

## 2016-03-01 MED ORDER — MORPHINE SULFATE 25 MG/ML IV SOLN
1.0000 mg/h | INTRAVENOUS | Status: DC
  Administered 2016-03-01: 2 mg/h via INTRAVENOUS
  Filled 2016-03-01: qty 10

## 2016-03-01 MED ORDER — METRONIDAZOLE IN NACL 5-0.79 MG/ML-% IV SOLN
500.0000 mg | Freq: Three times a day (TID) | INTRAVENOUS | Status: DC
  Administered 2016-03-01: 500 mg via INTRAVENOUS
  Filled 2016-03-01: qty 100

## 2016-03-31 NOTE — Progress Notes (Signed)
Patient to transfer to 6N30 report given to receiving nurse Zenida, all questions answered at this time.

## 2016-03-31 NOTE — Progress Notes (Signed)
PROGRESS NOTE    Terrance Perez  MCE:[id number] DOB: 30-Apr-1933 DOA: 01/30/2016 PCP: Geoffery Lyons, MD   Chief Complaint  Patient presents with  . Altered Mental Status    Brief Narrative:  HPI on 02/22/2016 by Dr. Linna Darner Terrance Perez is a 83 y.o. male with medical history significant of CAD, CABG, DM, dysphagia, glaucoma, gout, HLD, HTN, schizophrenia, Parkinson's, RLS presenting with cough, confusion, weakness, and gross hematuria. History provided by EDP and patient's wife as patient is unable to provide reliable information at this time.   Patient symptoms started approximately 2 weeks ago with a cough. Became significantly worse over the last week. However, since wife states that over the last day it has somewhat improved. His been associated with generalized weakness over the last 4-5 days which is getting worse along with progressive intermittent confusion. At baseline patient has days from time to time where he is somewhat confused. No formal diagnosis of dementia per wife. Patient also had single episode of gross hematuria that wife describes as almost entirely blood. Occurred approximate 4 days ago. Denies any fevers, abdominal pain, dysuria, frequency, chest pain, palpitations, neck stiffness, headache, LOC, fall.  Interim history Admitted for UTI and pneumonia, questionable aspiration. Found to have Escherichia coli bacteremia. Patient is having worsening renal function however responding to diuresis. Palliative care consulted and patient currently DNR.  Overnight, patient became hypoxic and required nonrebreather.  Spoke with patient's wife today, will transition patient to comfort care.   Assessment & Plan   Sepsis due to Ecoli UTI/Bacteremia -Sepsis was present upon admission. Protocol was initiated. -leukocytosis resolved  -Patient found to have Escherichia coli UTI bacteremia -Transitioned from Zosyn to ceftriaxone -Will discontinue antibiotics today  as patient is transitioning to comfort care  Left lower lobe pneumonia (present on admission)  -Question whether this is due to aspiration -Currently receiving Lasix by mouth -Continue supplemental oxygen maintain saturation above 92% -Continue aspiration precautions -Transitioned to ceftriaxone and Flagyl- Will discontinue today  AKI on CKD, stage 3 -Creatinine currently 2.56  -Given worsening creatinine, will hold Lasix -Continue to monitor urine output- UOP over past [id number] [id number] -Metabolic acidosis resolved  Diabetes mellitus, type II -Continue insulin sliding scale and CABG monitoring  Chronic Systolic heart failure -Given worsening creatinine, Lasix held -Beta blocker as well as ACE inhibitor also held due to risk of hypotension and acute kidney injury  Essential hypertension -Antihypertensives held due to risk of hypotension -Blood pressure currently well-controlled   Schizophrenia and parkinsons  -Patient more awake and alert, continue on carbidopa and levodopa. Quetiapine and sertraline only at night.   Metabolic encephalopathy -Appears to be improving -Multifactorial due to sepsis and medication induced, continue supportive care and psychotropic medications only at night.  Dysphagia -Speech therapy consultation appreciated -Continue dysphagia 1 diet with nectar thick liquids -Patient unable to swallow safely -Palliative care did speak with patient's family, no peg tube  GERD -Discontinue PPI  History of coronary artery disease/CVD -Discoontinue Plavix  Gout -discontinue allopurinol  Goals of care -Given Parkinson's disease as well as questionable encephalopathy/dementia, have consulted palliative care for goals of care discussion with family as well as CODE STATUS. -Palliative care met with patient's wife and code status changed to DNR and peg tube would not be wanted. -Spoke with patient's wife this morning, March 17, 2016, patient has not been improving  and has actually been declining. Patient did require nonrebreather overnight. Given this change, patient's wife has decided to pursue comfort care. -Will place patient  on morphine drip, titrate up to 10 mg per hour as needed for comfort. Will also place on Ativan. Will discontinue patient's antibiotics and other medications.  DVT Prophylaxis  heparin  Code Status: DNR  Family Communication: Wife at bedside  Disposition Plan: Admitted, transferred to 6 [address].  Consultants Palliative care  Procedures  None  Antibiotics   Anti-infectives    Start     Dose/Rate Route Frequency Ordered Stop   03/15/16 0600  metroNIDAZOLE (FLAGYL) IVPB 500 mg  Status:  Discontinued     500 mg 100 mL/hr over 60 Minutes Intravenous Every 8 hours 2016-03-15 0555 2016-03-15 1104   02/27/16 1000  cefTRIAXone (ROCEPHIN) 2 g in dextrose 5 % 50 mL IVPB  Status:  Discontinued     2 g 100 mL/hr over 30 Minutes Intravenous Every 24 hours 02/27/16 0903 03/15/16 1104   02/27/16 0915  metroNIDAZOLE (FLAGYL) tablet 500 mg  Status:  Discontinued     500 mg Oral Every 8 hours 02/27/16 0903 15-Mar-2016 0555   02/24/16 1400  piperacillin-tazobactam (ZOSYN) IVPB 3.375 g  Status:  Discontinued     3.375 g 12.5 mL/hr over 240 Minutes Intravenous Every 8 hours 02/24/16 1345 02/27/16 0902   02/22/16 1115  Ampicillin-Sulbactam (UNASYN) 3 g in sodium chloride 0.9 % 100 mL IVPB  Status:  Discontinued     3 g 200 mL/hr over 30 Minutes Intravenous Every 12 hours 02/22/16 1105 02/24/16 1316   02/22/16 1000  cefTRIAXone (ROCEPHIN) 1 g in dextrose 5 % 50 mL IVPB  Status:  Discontinued     1 g 100 mL/hr over 30 Minutes Intravenous Every 24 hours 02/07/2016 1658 02/22/16 0804   02/22/16 1000  azithromycin (ZITHROMAX) 500 mg in dextrose 5 % 250 mL IVPB  Status:  Discontinued     500 mg 250 mL/hr over 60 Minutes Intravenous Every 24 hours 02/24/2016 1658 02/22/16 0804   02/22/16 0945  piperacillin-tazobactam (ZOSYN) IVPB 3.375 g  Status:   Discontinued     3.375 g 12.5 mL/hr over 240 Minutes Intravenous Every 8 hours 02/22/16 0942 02/22/16 1105   02/20/2016 1515  azithromycin (ZITHROMAX) 500 mg in dextrose 5 % 250 mL IVPB     500 mg 250 mL/hr over 60 Minutes Intravenous  Once 02/11/2016 1501 02/25/2016 1621   02/08/2016 1330  cefTRIAXone (ROCEPHIN) 1 g in dextrose 5 % 50 mL IVPB  Status:  Discontinued     1 g 100 mL/hr over 30 Minutes Intravenous Every 24 hours 02/15/2016 1325 02/22/2016 1746      Subjective:   Doron Shake seen and examined today.  Currently on nonrebreather.   Objective:   Vitals:   02/29/16 2312 Mar 15, 2016 0309 Mar 15, 2016 0345 03/15/2016 0700  BP: 140/83 132/65  131/69  Pulse: 89 94  85  Resp: (!) 28 (!) 23  (!) 26  Temp: 100.1 F (37.8 C) (!) 101.6 F (38.7 C)  99.7 F (37.6 C)  TempSrc: Axillary Axillary  Axillary  SpO2: 100% 100%  99%  Weight:   85.5 kg (188 lb 7.9 oz)   Height:        Intake/Output Summary (Last 24 hours) at 03/15/2016 1105 Last data filed at 15-Mar-2016 0700  Gross per 24 hour  Intake              100 ml  Output              625 ml  Net             -  525 ml   Filed Weights   02/28/16 0323 02/29/16 0459 03/27/16 0345  Weight: 88.7 kg (195 lb 8.8 oz) 84.3 kg (185 lb 13.6 oz) 85.5 kg (188 lb 7.9 oz)    Exam  General: Well developed, chronically ill appearing  HEENT: NCAT, mucous membranes moist.   Cardiovascular: S1 S2 auscultated, RRR  Respiratory: Diminished breath sounds  Abdomen: Soft, nontender, nondistended, + bowel sounds  Extremities: warm dry without cyanosis clubbing or edema  Data Reviewed: I have personally reviewed following labs and imaging studies  CBC:  Recent Labs Lab 02/24/16 0423 02/25/16 0543 02/26/16 0520 02/27/16 0421 02/28/16 0326 02/29/16 0537 27-Mar-2016 0413  WBC 7.9 8.4 9.1 8.6 8.5 8.0 10.5  NEUTROABS 4.7 4.9 5.7 5.1  --   --   --   HGB 10.4* 10.5* 10.4* 10.5* 10.6* 10.8* 11.5*  HCT 32.7* 32.7* 33.3* 33.9* 34.2* 35.4* 39.1  MCV 95.6  95.9 97.1 97.7 97.7 98.3 101.3*  PLT 210 207 186 184 192 191 371   Basic Metabolic Panel:  Recent Labs Lab 02/26/16 0520 02/27/16 0421 02/28/16 0326 02/29/16 0537 03-27-16 0413  NA 148* 150* 151* 155* 159*  K 3.0* 3.2* 3.1* 3.0* 2.8*  CL 116* 116* 117* 119* 121*  CO2 _0 GLUCOSE 135* 117* 134* 131* 100*  BUN 41* 42* 46* 50* 59*  CREATININE 2.14* 2.29* 2.44* 2.45* 2.56*  CALCIUM 8.4* 8.5* 8.5* 8.6* 8.7*  MG  --  2.2  --  2.3  --    GFR: Estimated Creatinine Clearance: 24.5 mL/min (by C-G formula based on SCr of 2.56 mg/dL (H)). Liver Function Tests: No results for input(s): AST, ALT, ALKPHOS, BILITOT, PROT, ALBUMIN in the last 168 hours. No results for input(s): LIPASE, AMYLASE in the last 168 hours. No results for input(s): AMMONIA in the last 168 hours. Coagulation Profile: No results for input(s): INR, PROTIME in the last 168 hours. Cardiac Enzymes: No results for input(s): CKTOTAL, CKMB, CKMBINDEX, TROPONINI in the last 168 hours. BNP (last 3 results) No results for input(s): PROBNP in the last 8760 hours. HbA1C: No results for input(s): HGBA1C in the last 72 hours. CBG:  Recent Labs Lab 02/29/16 1117 02/29/16 1541 02/29/16 2121 March 27, 2016 0704 2016/03/27 1101  GLUCAP 122* 96 101* 94 105*   Lipid Profile: No results for input(s): CHOL, HDL, LDLCALC, TRIG, CHOLHDL, LDLDIRECT in the last 72 hours. Thyroid Function Tests: No results for input(s): TSH, T4TOTAL, FREET4, T3FREE, THYROIDAB in the last 72 hours. Anemia Panel: No results for input(s): VITAMINB12, FOLATE, FERRITIN, TIBC, IRON, RETICCTPCT in the last 72 hours. Urine analysis:    Component Value Date/Time   COLORURINE AMBER (A) 02/11/2016 1402   APPEARANCEUR TURBID (A) 02/27/2016 1402   LABSPEC 1.013 02/28/2016 1402   PHURINE 5.5 02/08/2016 1402   GLUCOSEU NEGATIVE 02/10/2016 1402   HGBUR LARGE (A) 02/05/2016 1402   BILIRUBINUR SMALL (A) 02/11/2016 1402   KETONESUR NEGATIVE 02/02/2016  1402   PROTEINUR NEGATIVE 02/25/2016 1402   UROBILINOGEN 1.0 03/24/2010 1929   NITRITE POSITIVE (A) 02/27/2016 1402   LEUKOCYTESUR LARGE (A) 02/25/2016 1402   Sepsis Labs: _1 (procalcitonin:4,lacticidven:4)  ) Recent Results (from the past 240 hour(s))  Blood Culture (routine x 2)     Status: Abnormal   Collection Time: 02/13/2016  1:19 PM  Result Value Ref Range Status   Specimen Description BLOOD RIGHT FOREARM  Final   Special Requests BOTTLES DRAWN AEROBIC AND ANAEROBIC  5CC  Final   Culture  Setup  Time   Final    GRAM NEGATIVE RODS IN BOTH AEROBIC AND ANAEROBIC BOTTLES CRITICAL RESULT CALLED TO, READ BACK BY AND VERIFIED WITH: T STONE,PHARMD AT 1016 02/22/16 BY L BENFIELD    Culture ESCHERICHIA COLI (A)  Final   Report Status 02/25/2016 FINAL  Final   Organism ID, Bacteria ESCHERICHIA COLI  Final      Susceptibility   Escherichia coli - MIC*    AMPICILLIN >=32 RESISTANT Resistant     CEFAZOLIN 16 SENSITIVE Sensitive     CEFEPIME <=1 SENSITIVE Sensitive     CEFTAZIDIME <=1 SENSITIVE Sensitive     CEFTRIAXONE <=1 SENSITIVE Sensitive     CIPROFLOXACIN <=0.25 SENSITIVE Sensitive     GENTAMICIN <=1 SENSITIVE Sensitive     IMIPENEM <=0.25 SENSITIVE Sensitive     TRIMETH/SULFA <=20 SENSITIVE Sensitive     AMPICILLIN/SULBACTAM >=32 RESISTANT Resistant     PIP/TAZO <=4 SENSITIVE Sensitive     Extended ESBL NEGATIVE Sensitive     * ESCHERICHIA COLI  Blood Culture ID Panel (Reflexed)     Status: Abnormal   Collection Time: 02/05/2016  1:19 PM  Result Value Ref Range Status   Enterococcus species NOT DETECTED NOT DETECTED Final   Listeria monocytogenes NOT DETECTED NOT DETECTED Final   Staphylococcus species NOT DETECTED NOT DETECTED Final   Staphylococcus aureus NOT DETECTED NOT DETECTED Final   Streptococcus species NOT DETECTED NOT DETECTED Final   Streptococcus agalactiae NOT DETECTED NOT DETECTED Final   Streptococcus pneumoniae NOT DETECTED NOT DETECTED Final    Streptococcus pyogenes NOT DETECTED NOT DETECTED Final   Acinetobacter baumannii NOT DETECTED NOT DETECTED Final   Enterobacteriaceae species DETECTED (A) NOT DETECTED Final    Comment: CRITICAL RESULT CALLED TO, READ BACK BY AND VERIFIED WITH: T STONE,PHARMD AT 1016 02/22/16 BY L BENFIELD    Enterobacter cloacae complex NOT DETECTED NOT DETECTED Final   Escherichia coli DETECTED (A) NOT DETECTED Final    Comment: CRITICAL RESULT CALLED TO, READ BACK BY AND VERIFIED WITH: T STONE,PHARMD AT 1016 02/22/16 BY L BENFIELD    Klebsiella oxytoca NOT DETECTED NOT DETECTED Final   Klebsiella pneumoniae NOT DETECTED NOT DETECTED Final   Proteus species NOT DETECTED NOT DETECTED Final   Serratia marcescens NOT DETECTED NOT DETECTED Final   Carbapenem resistance NOT DETECTED NOT DETECTED Final   Haemophilus influenzae NOT DETECTED NOT DETECTED Final   Neisseria meningitidis NOT DETECTED NOT DETECTED Final   Pseudomonas aeruginosa NOT DETECTED NOT DETECTED Final   Candida albicans NOT DETECTED NOT DETECTED Final   Candida glabrata NOT DETECTED NOT DETECTED Final   Candida krusei NOT DETECTED NOT DETECTED Final   Candida parapsilosis NOT DETECTED NOT DETECTED Final   Candida tropicalis NOT DETECTED NOT DETECTED Final  Blood Culture (routine x 2)     Status: None   Collection Time: 02/22/2016  1:56 PM  Result Value Ref Range Status   Specimen Description BLOOD LEFT FOREARM  Final   Special Requests BOTTLES DRAWN AEROBIC AND ANAEROBIC  5CC  Final   Culture NO GROWTH 5 DAYS  Final   Report Status 02/26/2016 FINAL  Final  Urine culture     Status: Abnormal   Collection Time: 02/15/2016  2:02 PM  Result Value Ref Range Status   Specimen Description URINE, RANDOM  Final   Special Requests NONE  Final   Culture >=100,000 COLONIES/mL ESCHERICHIA COLI (A)  Final   Report Status 02/25/2016 FINAL  Final   Organism ID, Bacteria ESCHERICHIA  COLI (A)  Final      Susceptibility   Escherichia coli - MIC*     AMPICILLIN >=32 RESISTANT Resistant     CEFAZOLIN <=4 SENSITIVE Sensitive     CEFTRIAXONE <=1 SENSITIVE Sensitive     CIPROFLOXACIN <=0.25 SENSITIVE Sensitive     GENTAMICIN <=1 SENSITIVE Sensitive     IMIPENEM <=0.25 SENSITIVE Sensitive     NITROFURANTOIN <=16 SENSITIVE Sensitive     TRIMETH/SULFA <=20 SENSITIVE Sensitive     AMPICILLIN/SULBACTAM >=32 RESISTANT Resistant     PIP/TAZO <=4 SENSITIVE Sensitive     Extended ESBL NEGATIVE Sensitive     * >=100,000 COLONIES/mL ESCHERICHIA COLI  MRSA PCR Screening     Status: None   Collection Time: 02/17/2016 10:30 PM  Result Value Ref Range Status   MRSA by PCR NEGATIVE NEGATIVE Final    Comment:        The GeneXpert MRSA Assay (FDA approved for NASAL specimens only), is one component of a comprehensive MRSA colonization surveillance program. It is not intended to diagnose MRSA infection nor to guide or monitor treatment for MRSA infections.   Culture, blood (routine x 2)     Status: None   Collection Time: 02/22/16  4:47 PM  Result Value Ref Range Status   Specimen Description BLOOD LEFT ANTECUBITAL  Final   Special Requests BOTTLES DRAWN AEROBIC AND ANAEROBIC 5CC EA  Final   Culture NO GROWTH 5 DAYS  Final   Report Status 02/27/2016 FINAL  Final  Culture, blood (routine x 2)     Status: None   Collection Time: 02/22/16  4:53 PM  Result Value Ref Range Status   Specimen Description BLOOD RIGHT HAND  Final   Special Requests BOTTLES DRAWN AEROBIC AND ANAEROBIC 10CC EA  Final   Culture NO GROWTH 5 DAYS  Final   Report Status 02/27/2016 FINAL  Final      Radiology Studies: No results found.   Scheduled Meds: . brimonidine  1 drop Both Eyes TID  . dorzolamide  1 drop Both Eyes BID   And  . timolol  1 drop Both Eyes BID  . latanoprost  1 drop Both Eyes QHS   Continuous Infusions: . morphine       LOS: 9 days   Time Spent in minutes   30 minutes  Wilton Thrall D.O. on 03/18/2016 at 11:05 AM  Between 7am to 7pm  - Pager - [phone]  After 7pm go to [email or web address] - password [email or web address]  And look for the night coverage person covering for me after hours  [facility] Office  [phone]

## 2016-03-31 NOTE — Discharge Summary (Signed)
Death Summary  Terrance Perez JSE:[id number] DOB: [birth year] DOA: 03-04-16  PCP: Geoffery Lyons, MD  Admit date: 03-04-16 Date of Death: 03-13-16 Time of Death: 1950 Notification:   History of present illness:  on 03-04-2016 by Dr. Christean Leaf Johnsonis a 83 y.o.malewith medical history significant of CAD, CABG, DM, dysphagia, glaucoma, gout, HLD, HTN, schizophrenia, Parkinson's, RLS presenting with cough, confusion, weakness, and gross hematuria. History provided by EDP and patient's wife as patient is unable to provide reliable information at this time.   Patient symptoms started approximately 2 weeks ago with a cough. Became significantly worse over the last week. However, since wife states that over the last day it has somewhat improved. His been associated with generalized weakness over the last 4-5 days which is getting worse along with progressive intermittent confusion. At baseline patient has days from time to time where he is somewhat confused. No formal diagnosis of dementia per wife. Patient also had single episode of gross hematuria that wife describes as almost entirely blood. Occurred approximate 4 days ago. Denies any fevers, abdominal pain, dysuria, frequency, chest pain, palpitations, neck stiffness, headache, LOC, fall.  Final Diagnoses:  Sepsis due to Ecoli UTI/Bacteremia -Sepsis was present upon admission. Protocol was initiated. -leukocytosis resolved  -Patient found to have Escherichia coli UTI bacteremia -Transitioned from Zosyn to ceftriaxone -Discontinued antibiotics as patient is transitioning to comfort care  Left lower lobe pneumonia (present on admission)  -Question whether this is due to aspiration -Was receiving Lasix by mouth -Continue supplemental oxygen maintain saturation above 92% -Continue aspiration precautions -Transitioned to ceftriaxone and Flagyl- discontinued  AKI on CKD, stage 3 -Creatinine 2.56  -Given  worsening creatinine, will hold Lasix -Continue to monitor urine output- UOP over past [id number] [id number] -Metabolic acidosis resolved  Diabetes mellitus, type II -Continue insulin sliding scale and CABG monitoring  Chronic Systolic heart failure -Given worsening creatinine, Lasix held -Beta blocker as well as ACE inhibitor also held due to risk of hypotension and acute kidney injury  Essential hypertension -Antihypertensives held due to risk of hypotension -Blood pressure currently well-controlled   Schizophrenia and parkinsons   Metabolic encephalopathy -Multifactorial due to sepsis and medication induced, continue supportive care and psychotropic medications only at night.  Dysphagia -Speech therapy consultation appreciated -placed dysphagia 1 diet with nectar thick liquids -Patient unable to swallow safely -Palliative care did speak with patient's family, no peg tube  GERD -Discontinued PPI  History of coronary artery disease/CVD -Discontinued Plavix  Gout -discontinued allopurinol  Goals of care -Given Parkinson's disease as well as questionable encephalopathy/dementia, have consulted palliative care for goals of care discussion with family as well as CODE STATUS. -Palliative care met with patient's wife and code status changed to DNR and peg tube would not be wanted. -Spoke with patient's wife this morning, 03-13-16, patient has not been improving and has actually been declining. Patient did require nonrebreather overnight. Given this change, patient's wife has decided to pursue comfort care. -Was placed on morphine drip, titrate up to 10 mg per hour as needed for comfort, and ativan -Patient expired on 03-13-2016 at 1950   The results of significant diagnostics from this hospitalization (including imaging, microbiology, ancillary and laboratory) are listed below for reference.    Significant Diagnostic Studies: Dg Chest Port 1 View  Result Date:  02/24/2016 CLINICAL DATA:  83 year old male with tachypnea. Initial encounter. EXAM: PORTABLE CHEST 1 VIEW COMPARISON:  02/23/2016 and earlier. FINDINGS: Portable AP semi upright view at 0758 hours. Continued low  lung volumes. Stable cardiac size and mediastinal contours. Continued increased interstitial opacity diffusely, stable since 02/23/2016. No superimposed pneumothorax, pleural effusion or consolidation. Small volume of oral contrast now in the epigastrium. IMPRESSION: Stable ventilation since 02/26/2016 with low lung volumes and nonspecific increased interstitial opacity which could reflect atelectasis, mild interstitial edema, or viral/atypical respiratory infection. Electronically Signed   By: Genevie Ann M.D.   On: 02/24/2016 09:02   Dg Chest Port 1 View  Result Date: 02/23/2016 CLINICAL DATA:  Dyspnea EXAM: PORTABLE CHEST 1 VIEW COMPARISON:  01/31/2016 FINDINGS: Cardiac shadow is mildly enlarged. Aortic calcifications are again seen. 4 inspiratory effort is noted with persistent vascular congestion. No bony abnormality is seen. IMPRESSION: Persistent vascular congestion and poor inspiratory effort. Electronically Signed   By: Inez Catalina M.D.   On: 02/23/2016 11:11   Dg Chest Portable 1 View  Result Date: 02/15/2016 CLINICAL DATA:  Fever with altered mental status EXAM: PORTABLE CHEST 1 VIEW COMPARISON:  07/18/2014 FINDINGS: AP portable semi-erect view of the chest demonstrates median sternotomy wires. Markedly low lung volumes. There is stable cardiomegaly with atherosclerosis. Mild central vascular congestion. Interval increase in interstitial and airspace opacity in the left lower lung zone which may represent infiltrate. No pneumothorax. IMPRESSION: 1. Low lung volumes. 2. Stable mild cardiomegaly with mild central vascular congestion. 3. Increased interstitial and airspace opacity in the left mid to lower lung zone, may represent superimposed infiltrate. Electronically Signed   By: Donavan Foil M.D.   On: 02/20/2016 14:30   Dg Swallowing Func-speech Pathology  Result Date: 02/23/2016 Objective Swallowing Evaluation: Type of Study: MBS-Modified Barium Swallow Study Patient Details Name: Terrance Perez MRN: [id number] Date of Birth: Jul 29, 1933 Today's Date: 02/23/2016 Time: SLP Start Time (ACUTE ONLY): 1109-SLP Stop Time (ACUTE ONLY): 1130 SLP Time Calculation (min) (ACUTE ONLY): 21 min Past Medical History: Past Medical History: Diagnosis Date . Abnormality of gait 12/02/2012 . Coronary artery disease  . CVD (cerebrovascular disease)  . Diabetes mellitus  . Diastolic dysfunction  . Dysphagia, pharyngoesophageal phase 06/08/2014  Thickened fluids . Dyspnea  . Fracture, humerus   right . Glaucoma  . Gout  . History of orthostatic hypotension  . Hyperlipidemia  . Hypertension  . Obesity  . Paranoid schizophrenia ([place])  . Parkinsonism ([place]) 12/02/2012 . PNA (pneumonia) May 2015 . RLS (restless legs syndrome) 02/04/2016 . Tremor  Past Surgical History: Past Surgical History: Procedure Laterality Date . ASD REPAIR   . CARDIAC CATHETERIZATION  08/31/2006  MODERATE INFERIOR WALL HYPOKINESIA WITH EF 45% . CHOLECYSTECTOMY   . CORONARY ARTERY BYPASS GRAFT  2008  LIMA GRAFT TO THE LAD, SAPHENOUS VEIN GRAFT TO THE DIAGONAL , SEQUENTIAL SAPHENOUS VEIN GRAFT TO THE PDA, POSTERIOR LATERAL , AND OBTUSE MARGINAL VESSELS. . TONSILLECTOMY   HPI: 83 y.o.malewith medical history significant for CAD, CABG, DM, dysphagia, glaucoma, gout, HLD, HTN, schizophrenia, Parkinson's, RLS presenting with cough, confusion, weakness, and gross hematuria. Symptoms progressive x2 weeks. Dx acute encephalopathy, UTI, possible pna. 11/23 CXR shows increased interstitial and airspace opacity in the left mid to lower lung, may represent infiltrate. Pt spends most of day at home in a lift chair; dysphagia since 05/2014 per [facility] notes.  Mrs. Laymon cares for her husband at home.  She thickens his liquids (consistency  unclear); feeds him his meals; he sleeps in lift chair and she sleeps next to him in recliner in living room.   Subjective: alert, but sleepy Assessment / Plan / Recommendation CHL IP CLINICAL  IMPRESSIONS 02/23/2016 Therapy Diagnosis Moderate oral phase dysphagia;Severe pharyngeal phase dysphagia Clinical Impression  Pt presents with a severe neuromuscular dysphagia likely due to history of Parkinson's.  Poor participation and generalized weakness worsened function. Pt could not sit upright in the MBS chair; would not bend at the waist and thus has a posterior lean requiring a towel to support pts head. He would not attempt a chin tuck despite max tactile cues and refused puree. He insisted on straw sips of liquids. Primary problems included lingual weakness and poor bolus formation with passive spillage of liquids to the pharynx. Base of tongue movement and hyolaryngeal mobility were severely weak causing moderate to severe residuals that pt could not clear despite cueing. Pt sensed penetrate of residual and effectively expelled this. Thin was attempted to reduce residue, but resulted in severe aspiration before the swallow. Would recommend pt continue current diet (dys 1 puree with nectar thick liquids) with high risk of aspiration and poor prognosis for improvement given participation.  Impact on safety and function Severe aspiration risk   CHL IP TREATMENT RECOMMENDATION 02/23/2016 Treatment Recommendations Therapy as outlined in treatment plan below   Prognosis 02/23/2016 Prognosis for Safe Diet Advancement Guarded Barriers to Reach Goals Motivation Barriers/Prognosis Comment -- [facility] IP DIET RECOMMENDATION 02/23/2016 SLP Diet Recommendations Dysphagia 1 (Puree) solids;Nectar thick liquid Liquid Administration via Straw Medication Administration Whole meds with puree Compensations Slow rate;Small sips/bites;Effortful swallow;Multiple dry swallows after each bite/sip;Follow solids with liquid Postural Changes --    CH[facility] IP OTHER RECOMMENDATIONS 02/23/2016 Recommended Consults -- Oral Care Recommendations Oral care BID Other Recommendations --   [facility] IP FOLLOW UP RECOMMENDATIONS 02/23/2016 Follow up Recommendations Skilled Nursing facility   [facility] IP FREQUENCY AND DURATION 02/23/2016 Speech Therapy Frequency (ACUTE ONLY) min 2x/week Treatment Duration 2 weeks      CH[facility] IP ORAL PHASE 02/23/2016 Oral Phase Impaired Oral - Pudding Teaspoon -- Oral - Pudding Cup -- Oral - Honey Teaspoon -- Oral - Honey Cup -- Oral - Nectar Teaspoon -- Oral - Nectar Cup Weak lingual manipulation;Reduced posterior propulsion;Lingual/palatal residue;Left pocketing in lateral sulci;Right pocketing in lateral sulci;Delayed oral transit;Decreased bolus cohesion;Premature spillage Oral - Nectar Straw Weak lingual manipulation;Reduced posterior propulsion;Lingual/palatal residue;Left pocketing in lateral sulci;Right pocketing in lateral sulci;Delayed oral transit;Decreased bolus cohesion;Premature spillage Oral - Thin Teaspoon -- Oral - Thin Cup -- Oral - Thin Straw Weak lingual manipulation;Reduced posterior propulsion;Lingual/palatal residue;Left pocketing in lateral sulci;Right pocketing in lateral sulci;Delayed oral transit;Decreased bolus cohesion;Premature spillage Oral - Puree Weak lingual manipulation;Reduced posterior propulsion;Lingual/palatal residue;Left pocketing in lateral sulci;Right pocketing in lateral sulci;Delayed oral transit;Decreased bolus cohesion;Premature spillage Oral - Mech Soft -- Oral - Regular -- Oral - Multi-Consistency -- Oral - Pill -- Oral Phase - Comment --  CHL IP PHARYNGEAL PHASE 02/23/2016 Pharyngeal Phase Impaired Pharyngeal- Pudding Teaspoon -- Pharyngeal -- Pharyngeal- Pudding Cup -- Pharyngeal -- Pharyngeal- Honey Teaspoon -- Pharyngeal -- Pharyngeal- Honey Cup -- Pharyngeal -- Pharyngeal- Nectar Teaspoon -- Pharyngeal -- Pharyngeal- Nectar Cup Delayed swallow initiation-pyriform sinuses;Reduced pharyngeal  peristalsis;Reduced epiglottic inversion;Reduced laryngeal elevation;Reduced anterior laryngeal mobility;Reduced tongue base retraction;Penetration/Apiration after swallow;Pharyngeal residue - valleculae;Pharyngeal residue - pyriform Pharyngeal Material enters airway, CONTACTS cords and then ejected out Pharyngeal- Nectar Straw Delayed swallow initiation-pyriform sinuses;Reduced pharyngeal peristalsis;Reduced epiglottic inversion;Reduced laryngeal elevation;Reduced anterior laryngeal mobility;Reduced tongue base retraction;Penetration/Apiration after swallow;Pharyngeal residue - valleculae;Pharyngeal residue - pyriform Pharyngeal Material enters airway, CONTACTS cords and then ejected out Pharyngeal- Thin Teaspoon -- Pharyngeal -- Pharyngeal- Thin Cup -- Pharyngeal -- Pharyngeal- Thin Straw Delayed swallow initiation-pyriform  sinuses;Reduced pharyngeal peristalsis;Reduced epiglottic inversion;Reduced laryngeal elevation;Reduced anterior laryngeal mobility;Reduced tongue base retraction;Pharyngeal residue - valleculae;Pharyngeal residue - pyriform;Penetration/Aspiration before swallow;Moderate aspiration Pharyngeal Material enters airway, passes BELOW cords and not ejected out despite cough attempt by patient Pharyngeal- Puree Delayed swallow initiation-pyriform sinuses;Reduced pharyngeal peristalsis;Reduced epiglottic inversion;Reduced laryngeal elevation;Reduced anterior laryngeal mobility;Reduced tongue base retraction;Pharyngeal residue - valleculae;Pharyngeal residue - pyriform Pharyngeal Material does not enter airway Pharyngeal- Mechanical Soft -- Pharyngeal -- Pharyngeal- Regular -- Pharyngeal -- Pharyngeal- Multi-consistency -- Pharyngeal -- Pharyngeal- Pill -- Pharyngeal -- Pharyngeal Comment --  No flowsheet data found. No flowsheet data found. DeBlois, Katherene Ponto 02/23/2016, 12:34 PM               Microbiology: Recent Results (from the past 240 hour(s))  Blood Culture (routine x 2)     Status:  Abnormal   Collection Time: 02/11/2016  1:19 PM  Result Value Ref Range Status   Specimen Description BLOOD RIGHT FOREARM  Final   Special Requests BOTTLES DRAWN AEROBIC AND ANAEROBIC  5CC  Final   Culture  Setup Time   Final    GRAM NEGATIVE RODS IN BOTH AEROBIC AND ANAEROBIC BOTTLES CRITICAL RESULT CALLED TO, READ BACK BY AND VERIFIED WITH: T STONE,PHARMD AT 1016 02/22/16 BY L BENFIELD    Culture ESCHERICHIA COLI (A)  Final   Report Status 02/25/2016 FINAL  Final   Organism ID, Bacteria ESCHERICHIA COLI  Final      Susceptibility   Escherichia coli - MIC*    AMPICILLIN >=32 RESISTANT Resistant     CEFAZOLIN 16 SENSITIVE Sensitive     CEFEPIME <=1 SENSITIVE Sensitive     CEFTAZIDIME <=1 SENSITIVE Sensitive     CEFTRIAXONE <=1 SENSITIVE Sensitive     CIPROFLOXACIN <=0.25 SENSITIVE Sensitive     GENTAMICIN <=1 SENSITIVE Sensitive     IMIPENEM <=0.25 SENSITIVE Sensitive     TRIMETH/SULFA <=20 SENSITIVE Sensitive     AMPICILLIN/SULBACTAM >=32 RESISTANT Resistant     PIP/TAZO <=4 SENSITIVE Sensitive     Extended ESBL NEGATIVE Sensitive     * ESCHERICHIA COLI  Blood Culture ID Panel (Reflexed)     Status: Abnormal   Collection Time: 02/11/2016  1:19 PM  Result Value Ref Range Status   Enterococcus species NOT DETECTED NOT DETECTED Final   Listeria monocytogenes NOT DETECTED NOT DETECTED Final   Staphylococcus species NOT DETECTED NOT DETECTED Final   Staphylococcus aureus NOT DETECTED NOT DETECTED Final   Streptococcus species NOT DETECTED NOT DETECTED Final   Streptococcus agalactiae NOT DETECTED NOT DETECTED Final   Streptococcus pneumoniae NOT DETECTED NOT DETECTED Final   Streptococcus pyogenes NOT DETECTED NOT DETECTED Final   Acinetobacter baumannii NOT DETECTED NOT DETECTED Final   Enterobacteriaceae species DETECTED (A) NOT DETECTED Final    Comment: CRITICAL RESULT CALLED TO, READ BACK BY AND VERIFIED WITH: T STONE,PHARMD AT 1016 02/22/16 BY L BENFIELD    Enterobacter  cloacae complex NOT DETECTED NOT DETECTED Final   Escherichia coli DETECTED (A) NOT DETECTED Final    Comment: CRITICAL RESULT CALLED TO, READ BACK BY AND VERIFIED WITH: T STONE,PHARMD AT 1016 02/22/16 BY L BENFIELD    Klebsiella oxytoca NOT DETECTED NOT DETECTED Final   Klebsiella pneumoniae NOT DETECTED NOT DETECTED Final   Proteus species NOT DETECTED NOT DETECTED Final   Serratia marcescens NOT DETECTED NOT DETECTED Final   Carbapenem resistance NOT DETECTED NOT DETECTED Final   Haemophilus influenzae NOT DETECTED NOT DETECTED Final   Neisseria meningitidis NOT DETECTED NOT  DETECTED Final   Pseudomonas aeruginosa NOT DETECTED NOT DETECTED Final   Candida albicans NOT DETECTED NOT DETECTED Final   Candida glabrata NOT DETECTED NOT DETECTED Final   Candida krusei NOT DETECTED NOT DETECTED Final   Candida parapsilosis NOT DETECTED NOT DETECTED Final   Candida tropicalis NOT DETECTED NOT DETECTED Final  Blood Culture (routine x 2)     Status: None   Collection Time: 02/08/2016  1:56 PM  Result Value Ref Range Status   Specimen Description BLOOD LEFT FOREARM  Final   Special Requests BOTTLES DRAWN AEROBIC AND ANAEROBIC  5CC  Final   Culture NO GROWTH 5 DAYS  Final   Report Status 02/26/2016 FINAL  Final  Urine culture     Status: Abnormal   Collection Time: 02/11/2016  2:02 PM  Result Value Ref Range Status   Specimen Description URINE, RANDOM  Final   Special Requests NONE  Final   Culture >=100,000 COLONIES/mL ESCHERICHIA COLI (A)  Final   Report Status 02/25/2016 FINAL  Final   Organism ID, Bacteria ESCHERICHIA COLI (A)  Final      Susceptibility   Escherichia coli - MIC*    AMPICILLIN >=32 RESISTANT Resistant     CEFAZOLIN <=4 SENSITIVE Sensitive     CEFTRIAXONE <=1 SENSITIVE Sensitive     CIPROFLOXACIN <=0.25 SENSITIVE Sensitive     GENTAMICIN <=1 SENSITIVE Sensitive     IMIPENEM <=0.25 SENSITIVE Sensitive     NITROFURANTOIN <=16 SENSITIVE Sensitive     TRIMETH/SULFA <=20  SENSITIVE Sensitive     AMPICILLIN/SULBACTAM >=32 RESISTANT Resistant     PIP/TAZO <=4 SENSITIVE Sensitive     Extended ESBL NEGATIVE Sensitive     * >=100,000 COLONIES/mL ESCHERICHIA COLI  MRSA PCR Screening     Status: None   Collection Time: 02/08/2016 10:30 PM  Result Value Ref Range Status   MRSA by PCR NEGATIVE NEGATIVE Final    Comment:        The GeneXpert MRSA Assay (FDA approved for NASAL specimens only), is one component of a comprehensive MRSA colonization surveillance program. It is not intended to diagnose MRSA infection nor to guide or monitor treatment for MRSA infections.   Culture, blood (routine x 2)     Status: None   Collection Time: 02/22/16  4:47 PM  Result Value Ref Range Status   Specimen Description BLOOD LEFT ANTECUBITAL  Final   Special Requests BOTTLES DRAWN AEROBIC AND ANAEROBIC 5CC EA  Final   Culture NO GROWTH 5 DAYS  Final   Report Status 02/27/2016 FINAL  Final  Culture, blood (routine x 2)     Status: None   Collection Time: 02/22/16  4:53 PM  Result Value Ref Range Status   Specimen Description BLOOD RIGHT HAND  Final   Special Requests BOTTLES DRAWN AEROBIC AND ANAEROBIC 10CC EA  Final   Culture NO GROWTH 5 DAYS  Final   Report Status 02/27/2016 FINAL  Final     Labs: Basic Metabolic Panel:  Recent Labs Lab 02/26/16 0520 02/27/16 0421 02/28/16 0326 02/29/16 0537 March 17, 2016 0413  NA 148* 150* 151* 155* 159*  K 3.0* 3.2* 3.1* 3.0* 2.8*  CL 116* 116* 117* 119* 121*  CO2 23 23 22 24 23   GLUCOSE 135* 117* 134* 131* 100*  BUN 41* 42* 46* 50* 59*  CREATININE 2.14* 2.29* 2.44* 2.45* 2.56*  CALCIUM 8.4* 8.5* 8.5* 8.6* 8.7*  MG  --  2.2  --  2.3  --    Liver  Function Tests: No results for input(s): AST, ALT, ALKPHOS, BILITOT, PROT, ALBUMIN in the last 168 hours. No results for input(s): LIPASE, AMYLASE in the last 168 hours. No results for input(s): AMMONIA in the last 168 hours. CBC:  Recent Labs Lab 02/25/16 0543 02/26/16 0520  02/27/16 0421 02/28/16 0326 02/29/16 0537 03-16-16 0413  WBC 8.4 9.1 8.6 8.5 8.0 10.5  NEUTROABS 4.9 5.7 5.1  --   --   --   HGB 10.5* 10.4* 10.5* 10.6* 10.8* 11.5*  HCT 32.7* 33.3* 33.9* 34.2* 35.4* 39.1  MCV 95.9 97.1 97.7 97.7 98.3 101.3*  PLT 207 186 184 192 191 231   Cardiac Enzymes: No results for input(s): CKTOTAL, CKMB, CKMBINDEX, TROPONINI in the last 168 hours. D-Dimer No results for input(s): DDIMER in the last 72 hours. BNP: Invalid input(s): POCBNP CBG:  Recent Labs Lab 02/29/16 1541 02/29/16 2121 03-16-2016 0704 03-16-2016 1101 March 16, 2016 1842  GLUCAP 96 101* 94 105* 111*   Anemia work up No results for input(s): VITAMINB12, FOLATE, FERRITIN, TIBC, IRON, RETICCTPCT in the last 72 hours. Urinalysis    Component Value Date/Time   COLORURINE AMBER (A) 02/03/2016 1402   APPEARANCEUR TURBID (A) 02/09/2016 1402   LABSPEC 1.013 02/09/2016 1402   PHURINE 5.5 02/10/2016 1402   GLUCOSEU NEGATIVE 02/06/2016 1402   HGBUR LARGE (A) 02/22/2016 1402   BILIRUBINUR SMALL (A) 02/03/2016 1402   KETONESUR NEGATIVE 02/16/2016 1402   PROTEINUR NEGATIVE 02/28/2016 1402   UROBILINOGEN 1.0 03/24/2010 1929   NITRITE POSITIVE (A) 02/10/2016 1402   LEUKOCYTESUR LARGE (A) 02/02/2016 1402   Sepsis Labs Invalid input(s): PROCALCITONIN,  WBC,  LACTICIDVEN     SIGNED:  Cristal Ford, MD  [facility] 03/02/2016, 11:05 AM Pager   If 7PM-7AM, please contact night-coverage [email or web address] Password [email or web address]

## 2016-03-31 NOTE — Progress Notes (Signed)
The patient was pronounced deceased at 19:50, on 03/18/2016.

## 2016-03-31 DEATH — deceased

## 2016-06-23 ENCOUNTER — Ambulatory Visit: Payer: Medicare Other | Admitting: Neurology

## 2017-07-01 IMAGING — CR DG CHEST 1V PORT
1 series · 1 of 1 positions shown · non-contrast
Comparison: 07/18/2014

CLINICAL DATA: Fever with altered mental status

EXAM:
PORTABLE CHEST 1 VIEW

[AP]
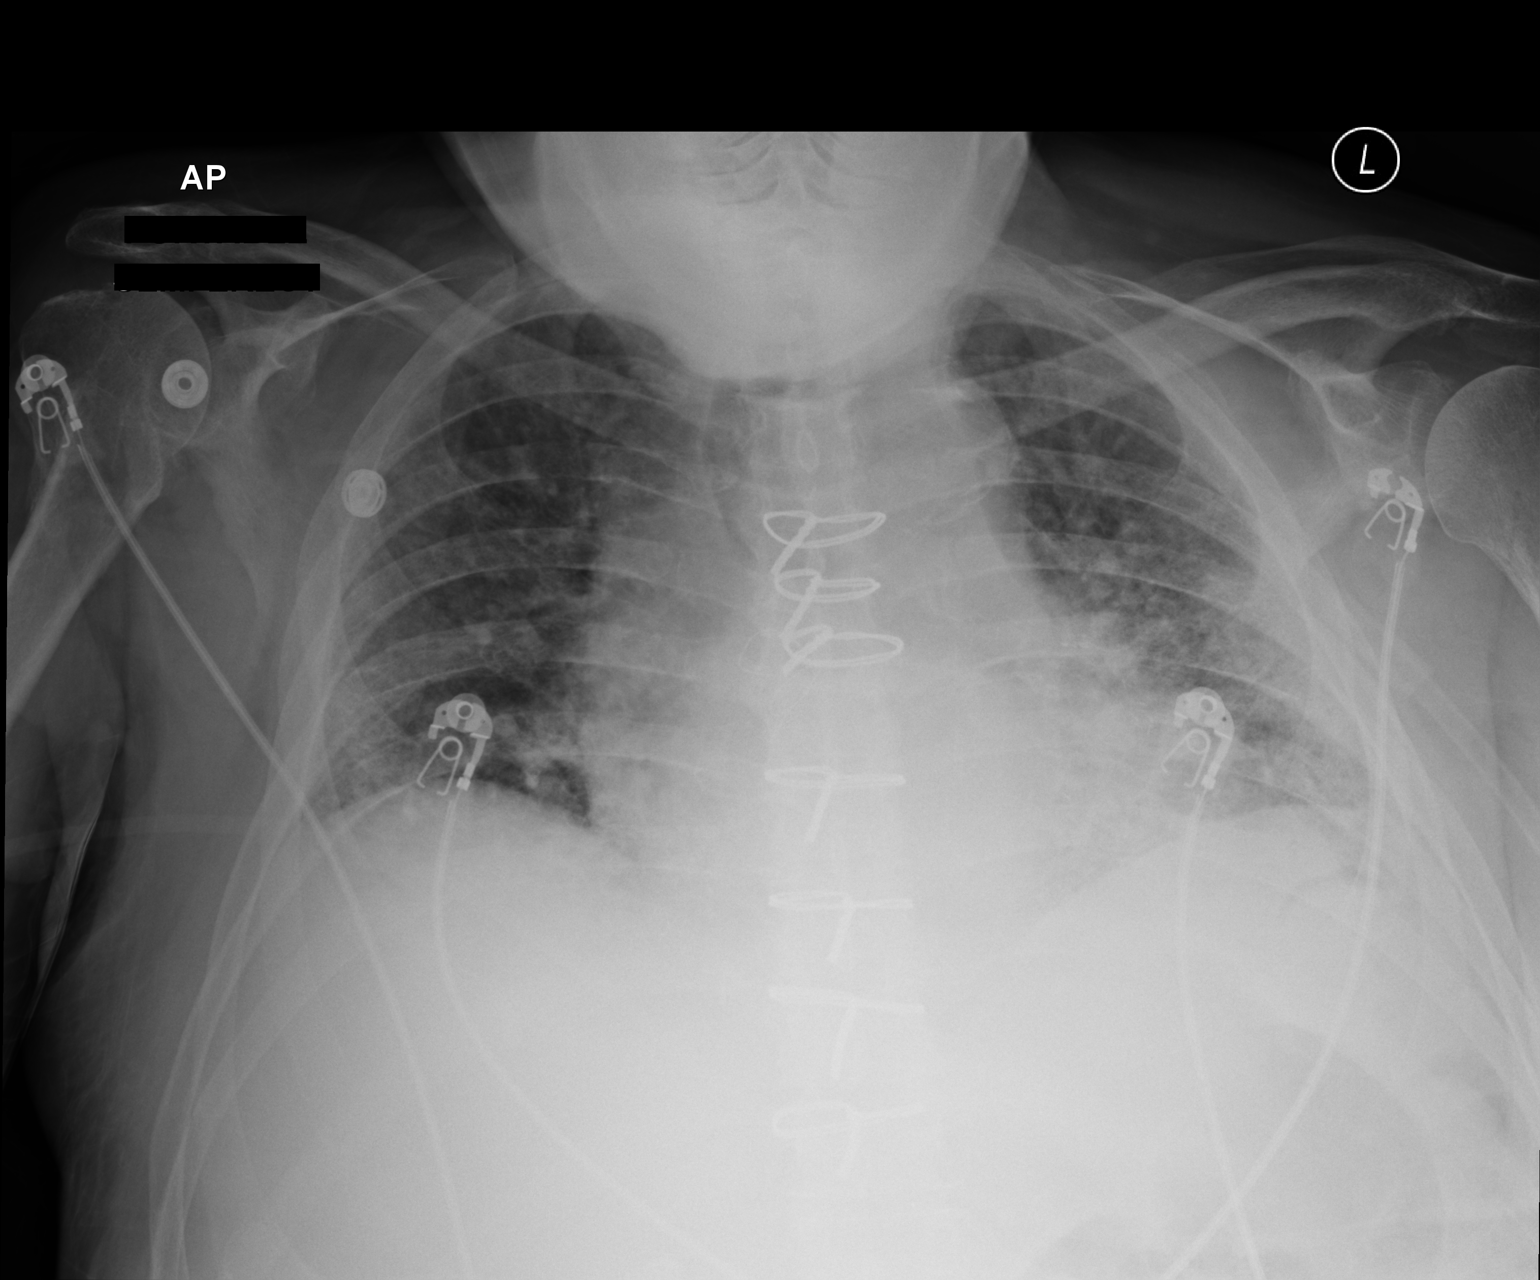

[1 of 1 positions shown; findings below may reference images not displayed]

FINDINGS: AP portable semi-erect view of the chest demonstrates median
sternotomy wires. Markedly low lung volumes. There is stable
cardiomegaly with atherosclerosis. Mild central vascular congestion.
Interval increase in interstitial and airspace opacity in the left
lower lung zone which may represent infiltrate. No pneumothorax.
IMPRESSION: 1. Low lung volumes.
2. Stable mild cardiomegaly with mild central vascular congestion.
3. Increased interstitial and airspace opacity in the left mid to
lower lung zone, may represent superimposed infiltrate.

## 2017-07-03 IMAGING — RF DG SWALLOWING FUNCTION - NRPT [facility]
1 series · 18 of 24 positions shown · non-contrast
Comparison: none

[Series 1: run · 27 acquisitions, 18 frames shown]
[im 1/27]
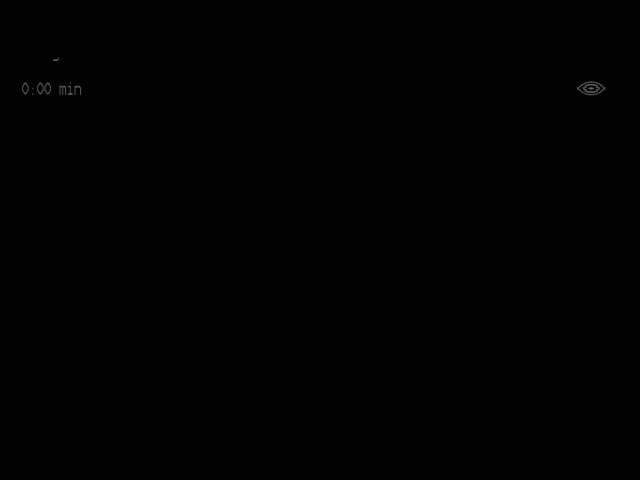
[im 3/27]
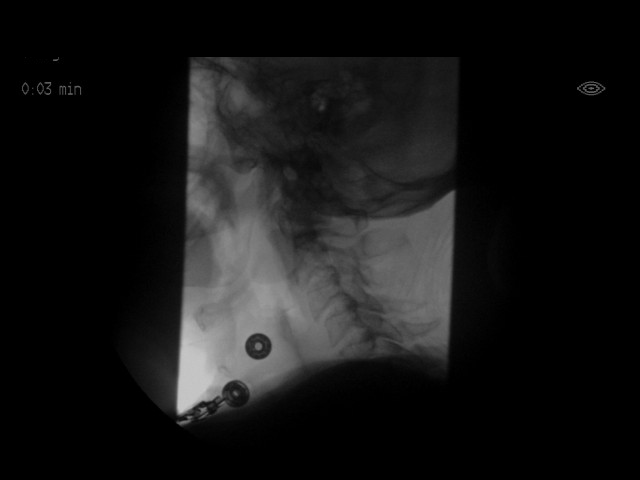
[im 4/27]
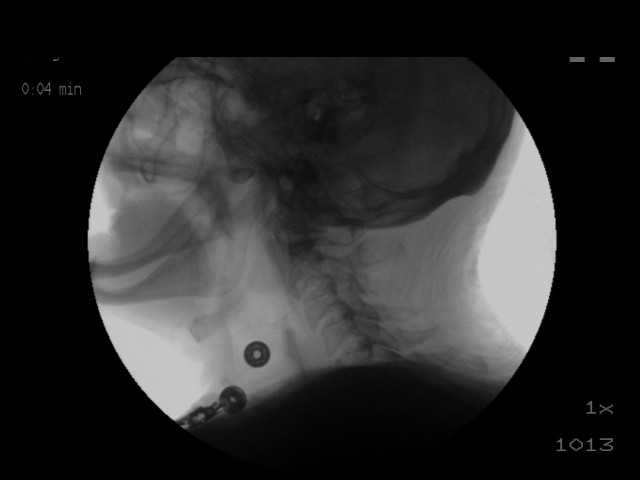
[im 5/27]
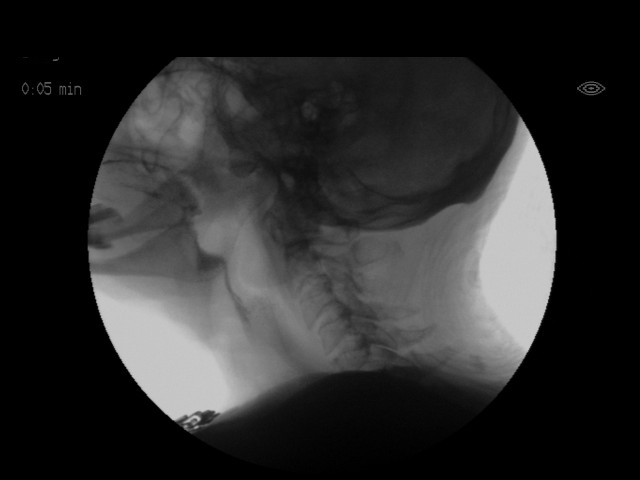
[im 7/27]
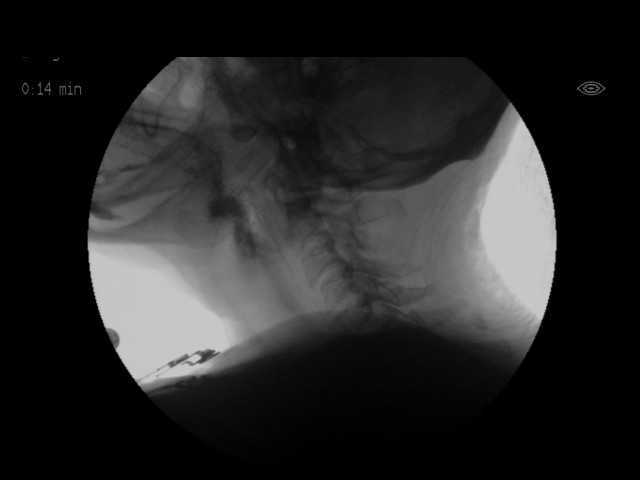
[im 8/27]
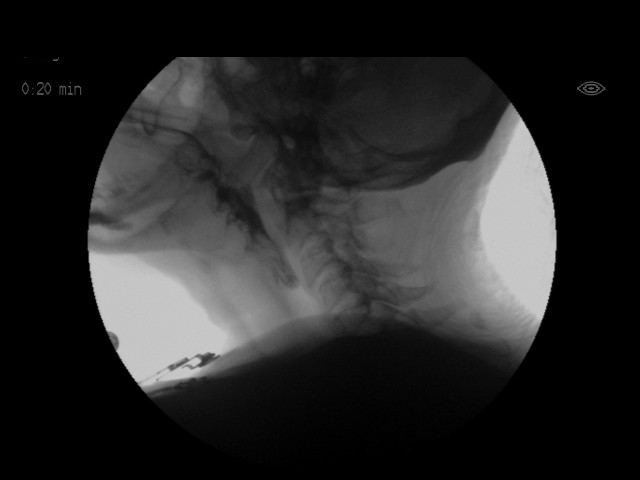
[im 10/27]
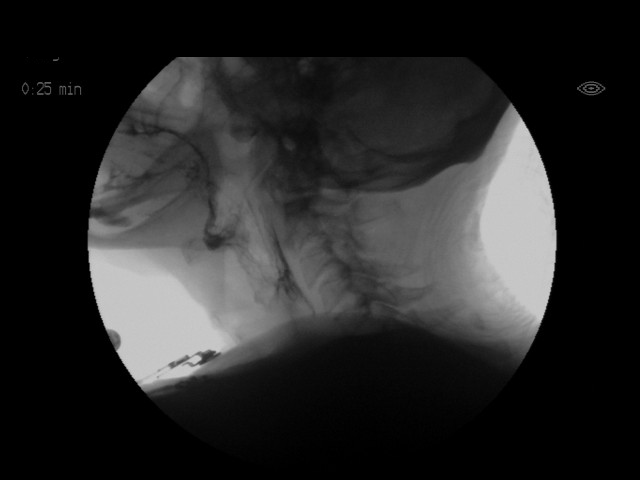
[im 12/27]
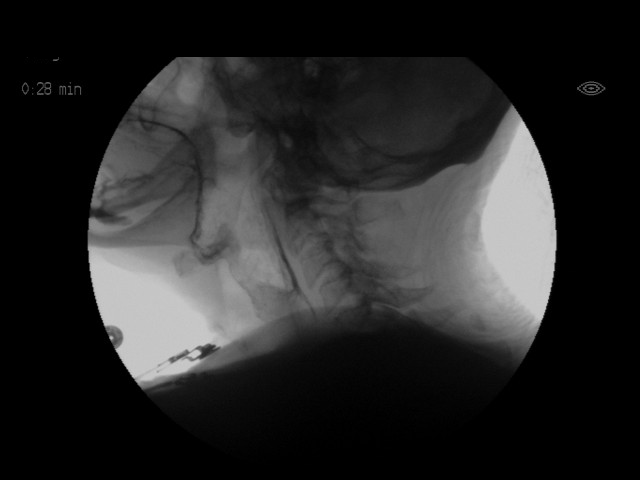
[im 13/27]
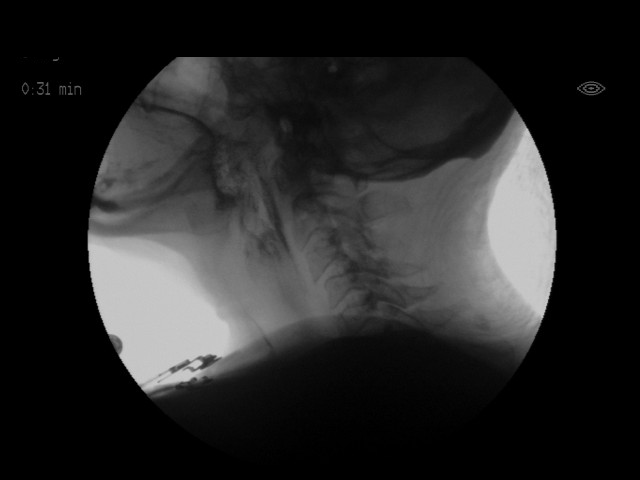
[im 14/27]
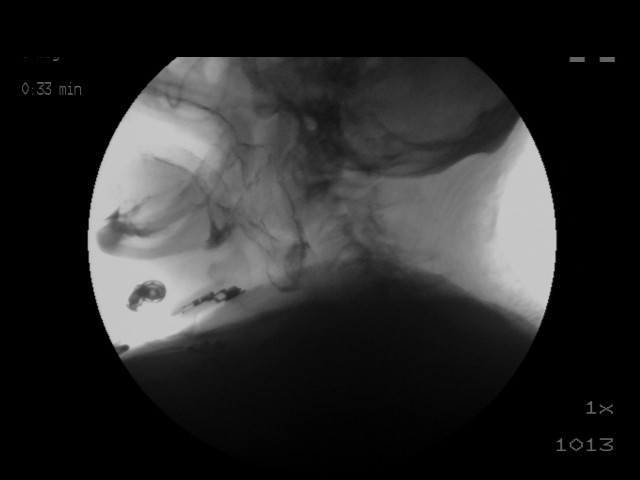
[im 16/27]
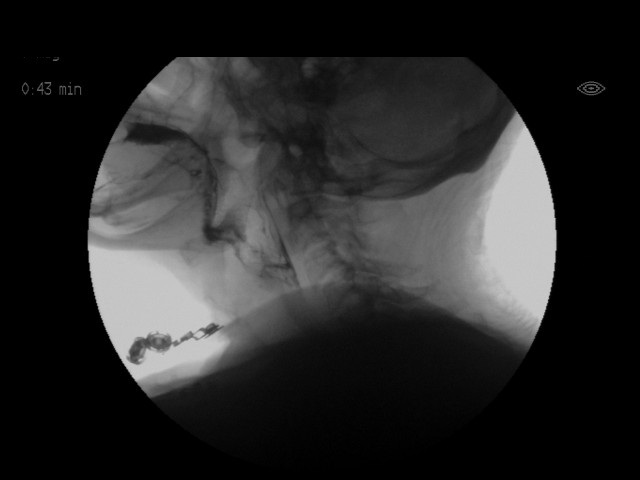
[im 17/27]
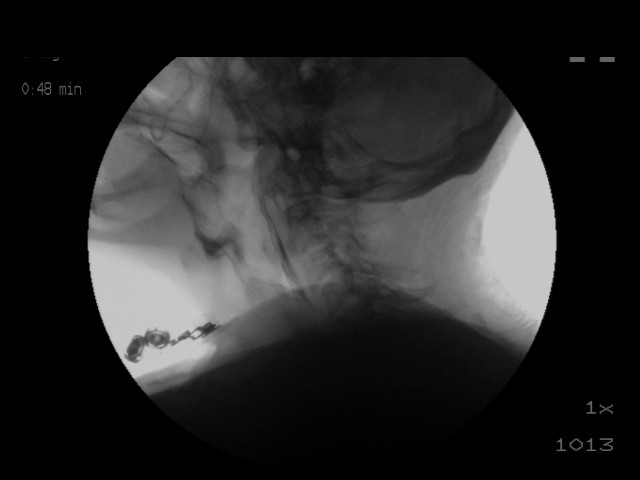
[im 19/27]
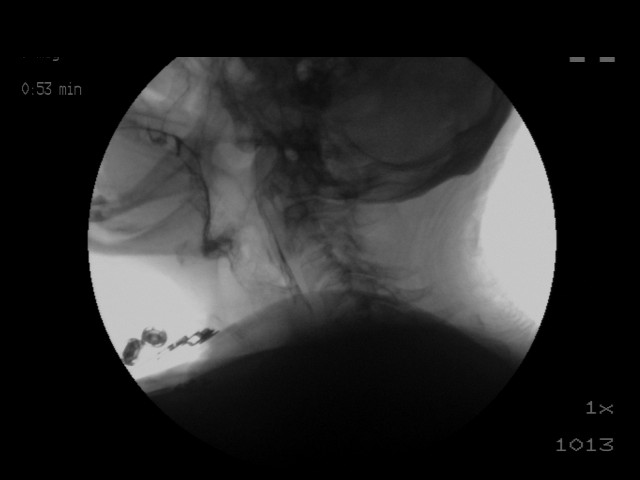
[im 21/27]
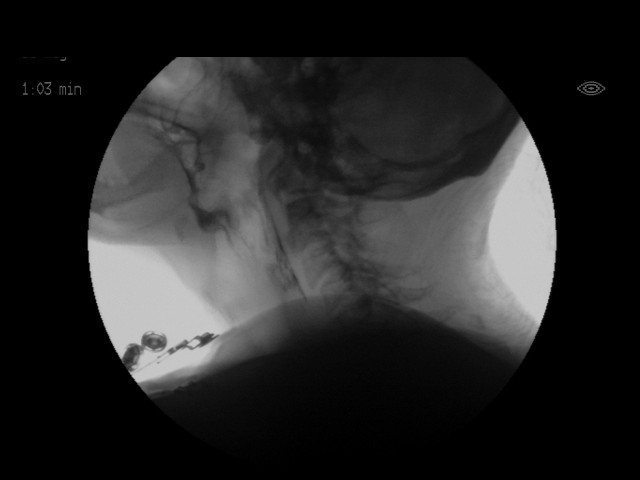
[im 22/27]
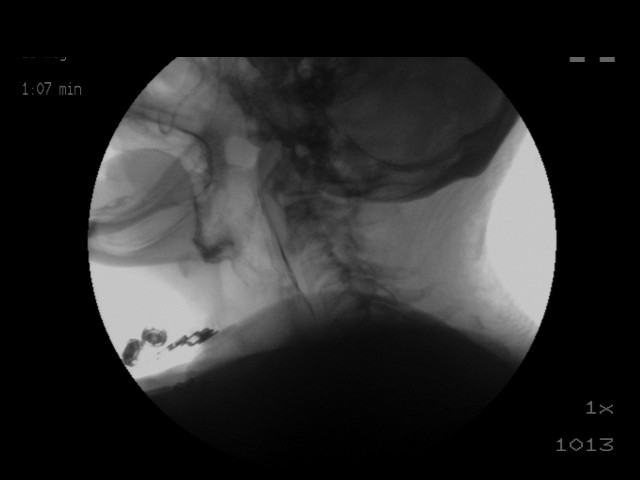
[im 23/27]
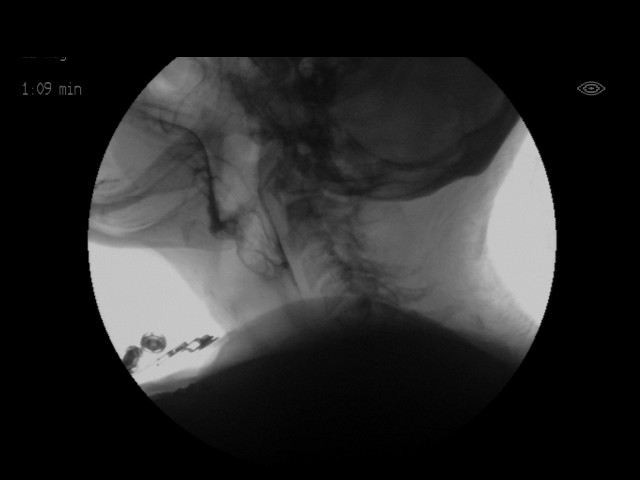
[im 25/27]
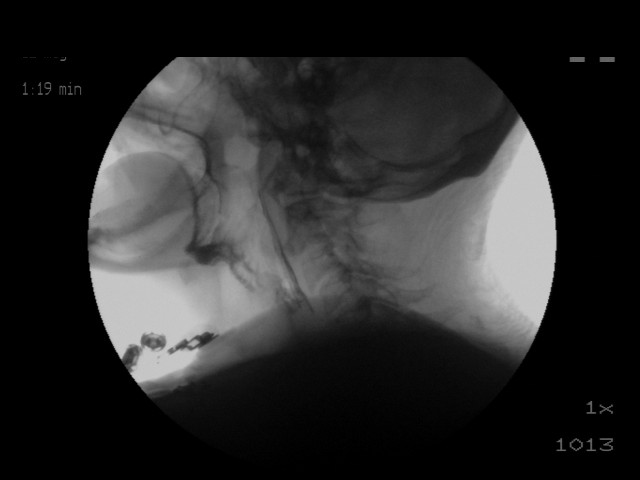
[im 27/27]
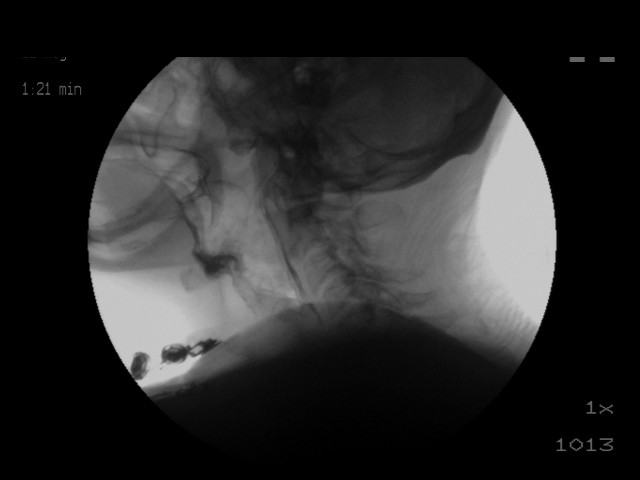

[18 of 24 positions shown; findings below may reference images not displayed]

FLUOROSCOPY FOR SWALLOWING FUNCTION STUDY:
Fluoroscopy was provided for swallowing function study, which was administered by a speech pathologist.  Final results and recommendations from this study are contained within the speech pathology report.

## 2017-07-03 IMAGING — CR DG CHEST 1V PORT
1 series · 1 of 1 positions shown · non-contrast
Comparison: 02/21/2016

CLINICAL DATA: Dyspnea

EXAM:
PORTABLE CHEST 1 VIEW

[AP]
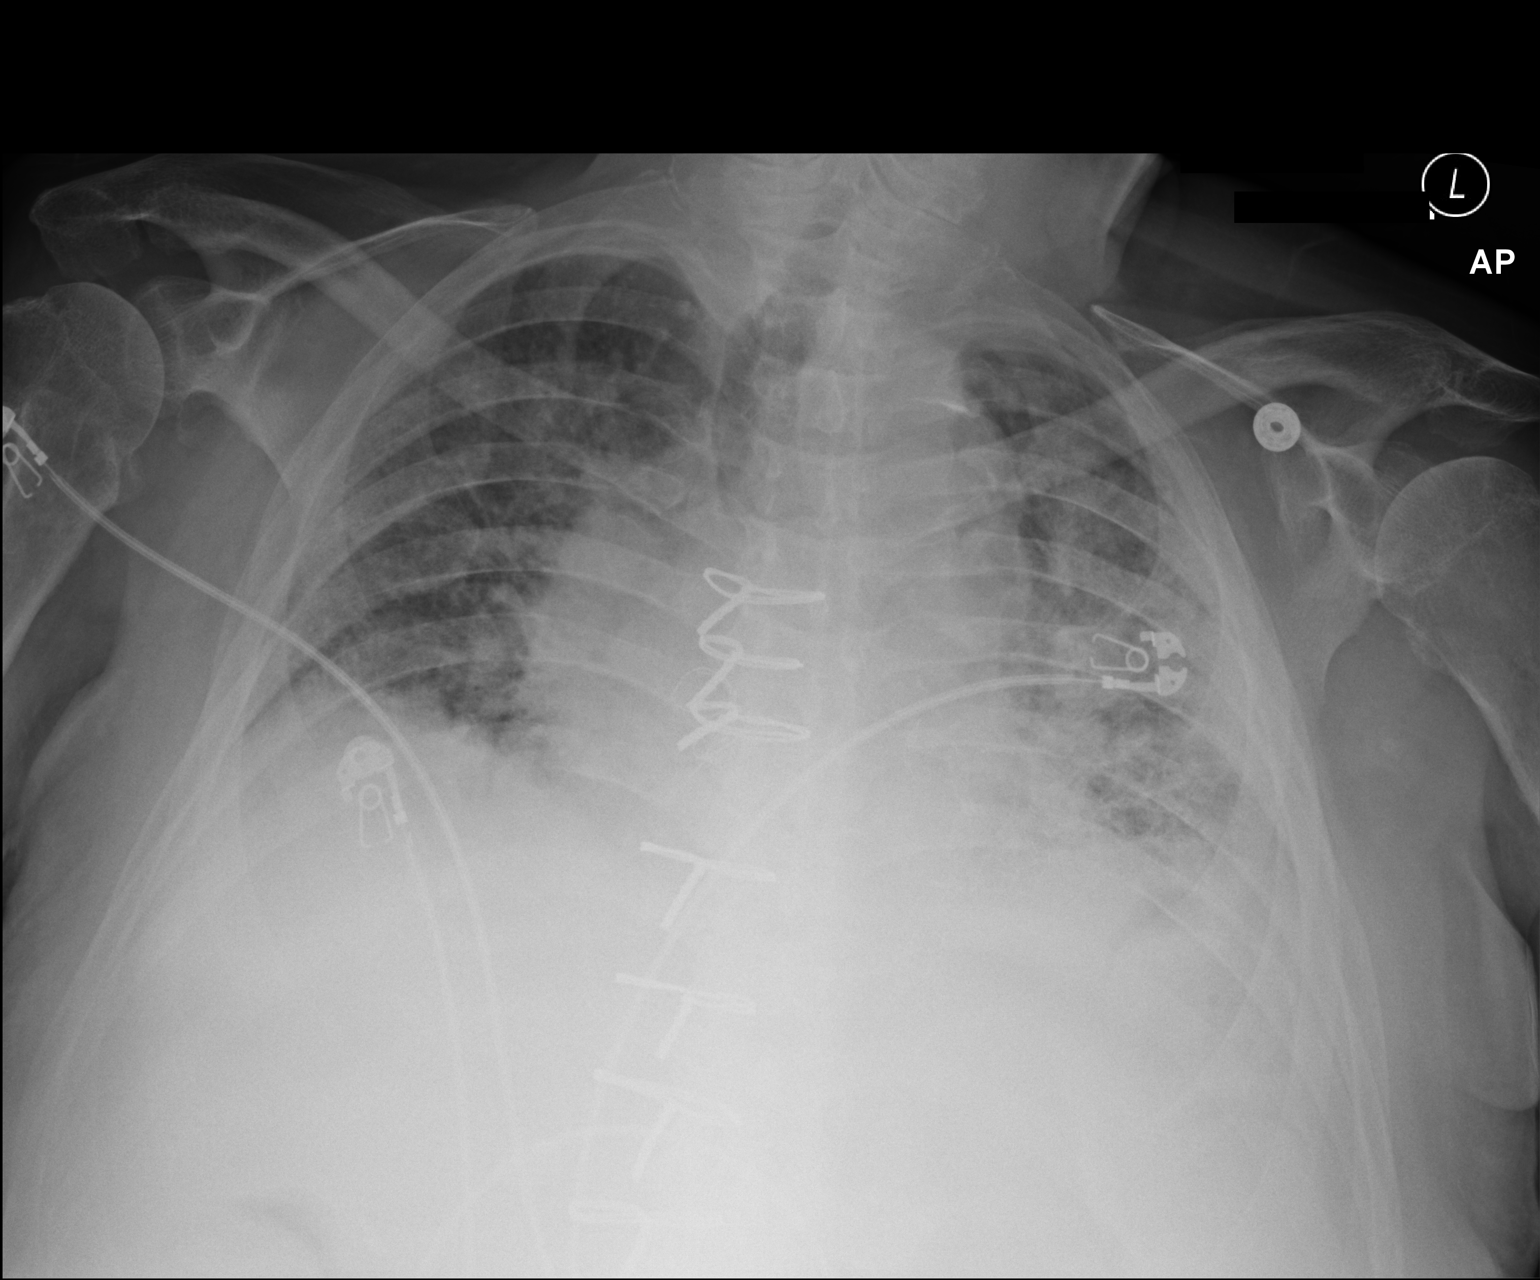

[1 of 1 positions shown; findings below may reference images not displayed]

FINDINGS: Cardiac shadow is mildly enlarged. Aortic calcifications are again
seen. 4 inspiratory effort is noted with persistent vascular
congestion. No bony abnormality is seen.
IMPRESSION: Persistent vascular congestion and poor inspiratory effort.

## 2017-07-04 IMAGING — CR DG CHEST 1V PORT
1 series · 1 of 1 positions shown · non-contrast
Comparison: 02/23/2016 and earlier.

CLINICAL DATA: 81-year-old male with tachypnea. Initial encounter.

EXAM:
PORTABLE CHEST 1 VIEW

[AP]
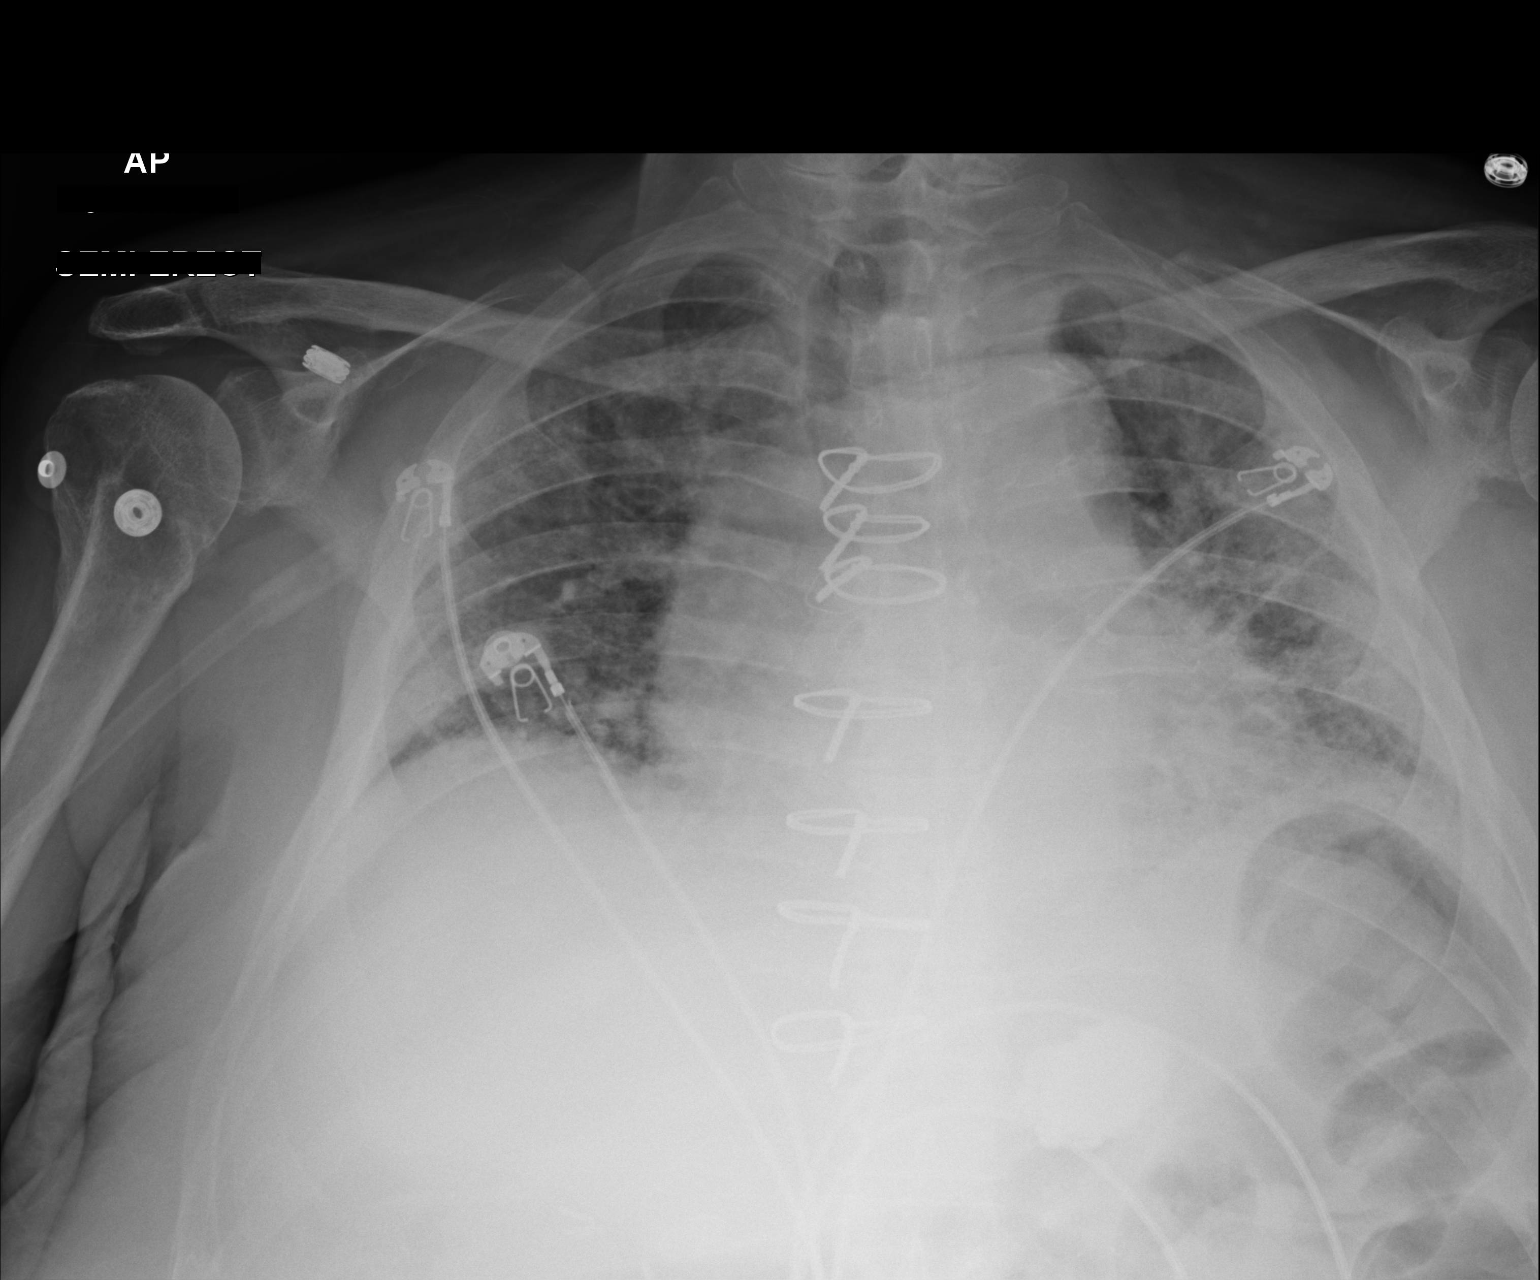

[1 of 1 positions shown; findings below may reference images not displayed]

FINDINGS: Portable AP semi upright view at 7846 hours. Continued low lung
volumes. Stable cardiac size and mediastinal contours. Continued
increased interstitial opacity diffusely, stable since 02/21/2016.
No superimposed pneumothorax, pleural effusion or consolidation.
Small volume of oral contrast now in the epigastrium.
IMPRESSION: Stable ventilation since 02/21/2016 with low lung volumes and
nonspecific increased interstitial opacity which could reflect
atelectasis, mild interstitial edema, or viral/atypical respiratory
infection.
